# Patient Record
Sex: Female | Born: 1950
Health system: Southern US, Community
[De-identification: ages and names within clinical notes are randomized; demographics above are authoritative.]

## PROBLEM LIST (undated history)

## (undated) DIAGNOSIS — C541 Malignant neoplasm of endometrium: Secondary | ICD-10-CM

## (undated) DIAGNOSIS — K219 Gastro-esophageal reflux disease without esophagitis: Secondary | ICD-10-CM

## (undated) DIAGNOSIS — R233 Spontaneous ecchymoses: Secondary | ICD-10-CM

## (undated) DIAGNOSIS — Z923 Personal history of irradiation: Secondary | ICD-10-CM

## (undated) DIAGNOSIS — C50919 Malignant neoplasm of unspecified site of unspecified female breast: Secondary | ICD-10-CM

## (undated) DIAGNOSIS — M1990 Unspecified osteoarthritis, unspecified site: Secondary | ICD-10-CM

## (undated) DIAGNOSIS — T7840XA Allergy, unspecified, initial encounter: Secondary | ICD-10-CM

## (undated) DIAGNOSIS — J302 Other seasonal allergic rhinitis: Secondary | ICD-10-CM

## (undated) DIAGNOSIS — R238 Other skin changes: Secondary | ICD-10-CM

## (undated) DIAGNOSIS — E039 Hypothyroidism, unspecified: Secondary | ICD-10-CM

## (undated) DIAGNOSIS — Z803 Family history of malignant neoplasm of breast: Secondary | ICD-10-CM

## (undated) DIAGNOSIS — Z9221 Personal history of antineoplastic chemotherapy: Secondary | ICD-10-CM

## (undated) DIAGNOSIS — Z87440 Personal history of urinary (tract) infections: Secondary | ICD-10-CM

## (undated) DIAGNOSIS — M8580 Other specified disorders of bone density and structure, unspecified site: Secondary | ICD-10-CM

## (undated) DIAGNOSIS — D649 Anemia, unspecified: Secondary | ICD-10-CM

## (undated) DIAGNOSIS — E079 Disorder of thyroid, unspecified: Secondary | ICD-10-CM

## (undated) DIAGNOSIS — N135 Crossing vessel and stricture of ureter without hydronephrosis: Secondary | ICD-10-CM

## (undated) HISTORY — DX: Other skin changes: R23.8

## (undated) HISTORY — DX: Other specified disorders of bone density and structure, unspecified site: M85.80

## (undated) HISTORY — PX: BACK SURGERY: SHX140

## (undated) HISTORY — DX: Unspecified osteoarthritis, unspecified site: M19.90

## (undated) HISTORY — DX: Malignant neoplasm of unspecified site of unspecified female breast: C50.919

## (undated) HISTORY — DX: Family history of malignant neoplasm of breast: Z80.3

---
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
## 1999-03-29 ENCOUNTER — Encounter: Payer: Self-pay | Admitting: Neurosurgery

## 1999-03-29 ENCOUNTER — Ambulatory Visit (HOSPITAL_COMMUNITY): Admission: RE | Admit: 1999-03-29 | Discharge: 1999-03-29 | Payer: Self-pay | Admitting: Neurosurgery

## 1999-04-08 ENCOUNTER — Encounter: Payer: Self-pay | Admitting: Neurosurgery

## 1999-04-08 ENCOUNTER — Ambulatory Visit (HOSPITAL_COMMUNITY): Admission: RE | Admit: 1999-04-08 | Discharge: 1999-04-08 | Payer: Self-pay | Admitting: Neurosurgery

## 1999-11-08 ENCOUNTER — Other Ambulatory Visit: Admission: RE | Admit: 1999-11-08 | Discharge: 1999-11-08 | Payer: Self-pay | Admitting: Family Medicine

## 1999-11-10 ENCOUNTER — Encounter: Admission: RE | Admit: 1999-11-10 | Discharge: 1999-11-10 | Payer: Self-pay | Admitting: Family Medicine

## 1999-11-10 ENCOUNTER — Encounter: Payer: Self-pay | Admitting: Family Medicine

## 2000-03-27 ENCOUNTER — Encounter: Payer: Self-pay | Admitting: Surgery

## 2000-03-27 ENCOUNTER — Encounter: Admission: RE | Admit: 2000-03-27 | Discharge: 2000-03-27 | Payer: Self-pay | Admitting: Surgery

## 2001-01-17 ENCOUNTER — Other Ambulatory Visit: Admission: RE | Admit: 2001-01-17 | Discharge: 2001-01-17 | Payer: Self-pay | Admitting: Family Medicine

## 2001-01-22 ENCOUNTER — Encounter: Admission: RE | Admit: 2001-01-22 | Discharge: 2001-01-22 | Payer: Self-pay | Admitting: Family Medicine

## 2001-01-22 ENCOUNTER — Encounter: Payer: Self-pay | Admitting: Family Medicine

## 2003-09-13 ENCOUNTER — Emergency Department (HOSPITAL_COMMUNITY): Admission: EM | Admit: 2003-09-13 | Discharge: 2003-09-13 | Payer: Self-pay | Admitting: Family Medicine

## 2003-11-30 ENCOUNTER — Emergency Department (HOSPITAL_COMMUNITY): Admission: EM | Admit: 2003-11-30 | Discharge: 2003-11-30 | Payer: Self-pay | Admitting: Family Medicine

## 2003-12-04 ENCOUNTER — Emergency Department (HOSPITAL_COMMUNITY): Admission: EM | Admit: 2003-12-04 | Discharge: 2003-12-04 | Payer: Self-pay | Admitting: Family Medicine

## 2004-03-10 ENCOUNTER — Emergency Department (HOSPITAL_COMMUNITY): Admission: EM | Admit: 2004-03-10 | Discharge: 2004-03-10 | Payer: Self-pay | Admitting: Family Medicine

## 2004-03-14 ENCOUNTER — Encounter: Admission: RE | Admit: 2004-03-14 | Discharge: 2004-03-14 | Payer: Self-pay | Admitting: Family Medicine

## 2004-03-14 ENCOUNTER — Encounter (INDEPENDENT_AMBULATORY_CARE_PROVIDER_SITE_OTHER): Payer: Self-pay | Admitting: *Deleted

## 2004-03-18 ENCOUNTER — Encounter (HOSPITAL_COMMUNITY): Admission: RE | Admit: 2004-03-18 | Discharge: 2004-06-16 | Payer: Self-pay | Admitting: Surgery

## 2004-04-08 ENCOUNTER — Encounter (INDEPENDENT_AMBULATORY_CARE_PROVIDER_SITE_OTHER): Payer: Self-pay | Admitting: *Deleted

## 2004-04-08 ENCOUNTER — Ambulatory Visit (HOSPITAL_COMMUNITY): Admission: RE | Admit: 2004-04-08 | Discharge: 2004-04-08 | Payer: Self-pay | Admitting: Surgery

## 2004-04-19 ENCOUNTER — Ambulatory Visit: Payer: Self-pay | Admitting: Oncology

## 2004-04-22 ENCOUNTER — Ambulatory Visit: Payer: Self-pay

## 2004-04-25 ENCOUNTER — Encounter (HOSPITAL_COMMUNITY): Admission: RE | Admit: 2004-04-25 | Discharge: 2004-07-24 | Payer: Self-pay | Admitting: Oncology

## 2004-04-28 ENCOUNTER — Ambulatory Visit (HOSPITAL_COMMUNITY): Admission: RE | Admit: 2004-04-28 | Discharge: 2004-04-28 | Payer: Self-pay | Admitting: Oncology

## 2004-04-29 ENCOUNTER — Ambulatory Visit (HOSPITAL_BASED_OUTPATIENT_CLINIC_OR_DEPARTMENT_OTHER): Admission: RE | Admit: 2004-04-29 | Discharge: 2004-04-29 | Payer: Self-pay | Admitting: Surgery

## 2004-04-29 ENCOUNTER — Ambulatory Visit (HOSPITAL_COMMUNITY): Admission: RE | Admit: 2004-04-29 | Discharge: 2004-04-29 | Payer: Self-pay | Admitting: Surgery

## 2004-06-07 ENCOUNTER — Ambulatory Visit: Payer: Self-pay | Admitting: Oncology

## 2004-07-26 ENCOUNTER — Ambulatory Visit: Payer: Self-pay | Admitting: Oncology

## 2004-09-12 ENCOUNTER — Ambulatory Visit: Payer: Self-pay | Admitting: Oncology

## 2004-09-26 ENCOUNTER — Ambulatory Visit: Admission: RE | Admit: 2004-09-26 | Discharge: 2004-12-08 | Payer: Self-pay | Admitting: Radiation Oncology

## 2004-11-14 ENCOUNTER — Ambulatory Visit: Payer: Self-pay | Admitting: Oncology

## 2004-12-16 ENCOUNTER — Ambulatory Visit (HOSPITAL_COMMUNITY): Admission: RE | Admit: 2004-12-16 | Discharge: 2004-12-16 | Payer: Self-pay | Admitting: Surgery

## 2005-02-17 ENCOUNTER — Ambulatory Visit: Payer: Self-pay | Admitting: Oncology

## 2005-03-23 ENCOUNTER — Encounter: Admission: RE | Admit: 2005-03-23 | Discharge: 2005-03-23 | Payer: Self-pay | Admitting: Oncology

## 2005-08-02 ENCOUNTER — Ambulatory Visit: Payer: Self-pay | Admitting: Oncology

## 2005-08-16 ENCOUNTER — Encounter: Admission: RE | Admit: 2005-08-16 | Discharge: 2005-08-16 | Payer: Self-pay | Admitting: Oncology

## 2005-08-28 LAB — CBC WITH DIFFERENTIAL/PLATELET
BASO%: 0.5 % (ref 0.0–2.0)
EOS%: 4.8 % (ref 0.0–7.0)
Eosinophils Absolute: 0.2 10*3/uL (ref 0.0–0.5)
LYMPH%: 21.2 % (ref 14.0–48.0)
MCH: 33.7 pg (ref 26.0–34.0)
MCHC: 34 g/dL (ref 32.0–36.0)
MCV: 98.9 fL (ref 81.0–101.0)
MONO%: 6.8 % (ref 0.0–13.0)
Platelets: 213 10*3/uL (ref 145–400)
RBC: 3.53 10*6/uL — ABNORMAL LOW (ref 3.70–5.32)
RDW: 12.7 % (ref 11.3–14.5)

## 2005-08-28 LAB — COMPREHENSIVE METABOLIC PANEL
AST: 22 U/L (ref 0–37)
Alkaline Phosphatase: 77 U/L (ref 39–117)
Glucose, Bld: 105 mg/dL — ABNORMAL HIGH (ref 70–99)
Potassium: 3.8 mEq/L (ref 3.5–5.3)
Sodium: 139 mEq/L (ref 135–145)
Total Bilirubin: 0.6 mg/dL (ref 0.3–1.2)
Total Protein: 7.8 g/dL (ref 6.0–8.3)

## 2005-08-28 LAB — CANCER ANTIGEN 27.29: CA 27.29: 26 U/mL (ref 0–39)

## 2005-09-06 ENCOUNTER — Encounter: Admission: RE | Admit: 2005-09-06 | Discharge: 2005-09-06 | Payer: Self-pay | Admitting: Oncology

## 2006-02-23 ENCOUNTER — Ambulatory Visit: Payer: Self-pay | Admitting: Oncology

## 2006-06-20 ENCOUNTER — Ambulatory Visit: Payer: Self-pay | Admitting: Oncology

## 2006-08-16 ENCOUNTER — Ambulatory Visit: Payer: Self-pay | Admitting: Oncology

## 2006-08-20 ENCOUNTER — Encounter: Admission: RE | Admit: 2006-08-20 | Discharge: 2006-08-20 | Payer: Self-pay | Admitting: Oncology

## 2006-08-21 LAB — COMPREHENSIVE METABOLIC PANEL
AST: 17 U/L (ref 0–37)
Albumin: 4.3 g/dL (ref 3.5–5.2)
Alkaline Phosphatase: 86 U/L (ref 39–117)
Calcium: 9.2 mg/dL (ref 8.4–10.5)
Chloride: 106 mEq/L (ref 96–112)
Glucose, Bld: 96 mg/dL (ref 70–99)
Potassium: 3.7 mEq/L (ref 3.5–5.3)
Sodium: 143 mEq/L (ref 135–145)
Total Protein: 7.8 g/dL (ref 6.0–8.3)

## 2006-08-21 LAB — CBC WITH DIFFERENTIAL/PLATELET
Basophils Absolute: 0 10*3/uL (ref 0.0–0.1)
Eosinophils Absolute: 0 10*3/uL (ref 0.0–0.5)
HGB: 11.4 g/dL — ABNORMAL LOW (ref 11.6–15.9)
MCV: 95 fL (ref 81.0–101.0)
MONO%: 8.9 % (ref 0.0–13.0)
NEUT#: 2.7 10*3/uL (ref 1.5–6.5)
RBC: 3.52 10*6/uL — ABNORMAL LOW (ref 3.70–5.32)
RDW: 12.9 % (ref 11.3–14.5)
WBC: 4 10*3/uL (ref 3.9–10.0)
lymph#: 0.9 10*3/uL (ref 0.9–3.3)

## 2007-02-12 ENCOUNTER — Encounter: Admission: RE | Admit: 2007-02-12 | Discharge: 2007-02-12 | Payer: Self-pay | Admitting: Family Medicine

## 2007-08-28 ENCOUNTER — Ambulatory Visit: Payer: Self-pay | Admitting: Oncology

## 2007-09-02 LAB — CBC WITH DIFFERENTIAL/PLATELET
Basophils Absolute: 0 10*3/uL (ref 0.0–0.1)
EOS%: 1.1 % (ref 0.0–7.0)
HGB: 11.5 g/dL — ABNORMAL LOW (ref 11.6–15.9)
MCH: 33.4 pg (ref 26.0–34.0)
MCV: 96.7 fL (ref 81.0–101.0)
MONO%: 5.8 % (ref 0.0–13.0)
NEUT#: 4.1 10*3/uL (ref 1.5–6.5)
RBC: 3.44 10*6/uL — ABNORMAL LOW (ref 3.70–5.32)
RDW: 12.8 % (ref 11.3–14.5)
lymph#: 1.5 10*3/uL (ref 0.9–3.3)

## 2007-09-02 LAB — COMPREHENSIVE METABOLIC PANEL
ALT: <8 U/L (ref 0–35)
AST: 14 U/L (ref 0–37)
Albumin: 4.1 g/dL (ref 3.5–5.2)
Alkaline Phosphatase: 80 U/L (ref 39–117)
BUN: 14 mg/dL (ref 6–23)
Calcium: 9.3 mg/dL (ref 8.4–10.5)
Chloride: 103 mEq/L (ref 96–112)
Potassium: 3.6 mEq/L (ref 3.5–5.3)
Sodium: 140 mEq/L (ref 135–145)
Total Protein: 7.5 g/dL (ref 6.0–8.3)

## 2007-09-17 ENCOUNTER — Encounter: Admission: RE | Admit: 2007-09-17 | Discharge: 2007-09-17 | Payer: Self-pay | Admitting: Oncology

## 2008-03-23 ENCOUNTER — Other Ambulatory Visit: Admission: RE | Admit: 2008-03-23 | Discharge: 2008-03-23 | Payer: Self-pay | Admitting: Family Medicine

## 2008-05-27 ENCOUNTER — Encounter: Admission: RE | Admit: 2008-05-27 | Discharge: 2008-05-27 | Payer: Self-pay | Admitting: Surgery

## 2008-07-27 ENCOUNTER — Emergency Department (HOSPITAL_COMMUNITY): Admission: EM | Admit: 2008-07-27 | Discharge: 2008-07-27 | Payer: Self-pay | Admitting: Emergency Medicine

## 2008-09-09 ENCOUNTER — Ambulatory Visit: Payer: Self-pay | Admitting: Oncology

## 2008-09-11 LAB — CBC WITH DIFFERENTIAL/PLATELET
Basophils Absolute: 0 10*3/uL (ref 0.0–0.1)
Eosinophils Absolute: 0.1 10*3/uL (ref 0.0–0.5)
HCT: 33.3 % — ABNORMAL LOW (ref 34.8–46.6)
HGB: 11.2 g/dL — ABNORMAL LOW (ref 11.6–15.9)
NEUT#: 3 10*3/uL (ref 1.5–6.5)
NEUT%: 68.8 % (ref 38.4–76.8)
RDW: 12.7 % (ref 11.2–14.5)
lymph#: 1 10*3/uL (ref 0.9–3.3)

## 2008-09-11 LAB — COMPREHENSIVE METABOLIC PANEL
Albumin: 3.7 g/dL (ref 3.5–5.2)
BUN: 12 mg/dL (ref 6–23)
CO2: 27 mEq/L (ref 19–32)
Calcium: 8.4 mg/dL (ref 8.4–10.5)
Chloride: 107 mEq/L (ref 96–112)
Creatinine, Ser: 0.73 mg/dL (ref 0.40–1.20)
Glucose, Bld: 98 mg/dL (ref 70–99)
Potassium: 4 mEq/L (ref 3.5–5.3)

## 2008-09-11 LAB — CANCER ANTIGEN 27.29: CA 27.29: 23 U/mL (ref 0–39)

## 2009-09-06 ENCOUNTER — Ambulatory Visit: Payer: Self-pay | Admitting: Oncology

## 2009-09-08 LAB — CBC WITH DIFFERENTIAL/PLATELET
BASO%: 0.4 % (ref 0.0–2.0)
Basophils Absolute: 0 10*3/uL (ref 0.0–0.1)
EOS%: 2 % (ref 0.0–7.0)
HGB: 11 g/dL — ABNORMAL LOW (ref 11.6–15.9)
MCH: 34.5 pg — ABNORMAL HIGH (ref 25.1–34.0)
MCHC: 34.1 g/dL (ref 31.5–36.0)
MCV: 101.1 fL — ABNORMAL HIGH (ref 79.5–101.0)
MONO%: 8.8 % (ref 0.0–14.0)
RBC: 3.2 10*6/uL — ABNORMAL LOW (ref 3.70–5.45)
RDW: 12.6 % (ref 11.2–14.5)
lymph#: 1.5 10*3/uL (ref 0.9–3.3)

## 2009-09-08 LAB — COMPREHENSIVE METABOLIC PANEL
ALT: 12 U/L (ref 0–35)
AST: 21 U/L (ref 0–37)
Albumin: 3.6 g/dL (ref 3.5–5.2)
Alkaline Phosphatase: 51 U/L (ref 39–117)
BUN: 11 mg/dL (ref 6–23)
Potassium: 3.7 mEq/L (ref 3.5–5.3)

## 2009-09-29 ENCOUNTER — Encounter: Admission: RE | Admit: 2009-09-29 | Discharge: 2009-09-29 | Payer: Self-pay | Admitting: Oncology

## 2010-06-11 ENCOUNTER — Encounter: Payer: Self-pay | Admitting: Surgery

## 2010-06-12 ENCOUNTER — Encounter: Payer: Self-pay | Admitting: Oncology

## 2010-06-12 ENCOUNTER — Encounter: Payer: Self-pay | Admitting: Surgery

## 2010-09-06 ENCOUNTER — Encounter (HOSPITAL_BASED_OUTPATIENT_CLINIC_OR_DEPARTMENT_OTHER): Payer: 59 | Admitting: Oncology

## 2010-09-06 ENCOUNTER — Other Ambulatory Visit: Payer: Self-pay | Admitting: Oncology

## 2010-09-06 DIAGNOSIS — Z170 Estrogen receptor positive status [ER+]: Secondary | ICD-10-CM

## 2010-09-06 DIAGNOSIS — C50519 Malignant neoplasm of lower-outer quadrant of unspecified female breast: Secondary | ICD-10-CM

## 2010-09-06 LAB — COMPREHENSIVE METABOLIC PANEL
Alkaline Phosphatase: 51 U/L (ref 39–117)
BUN: 12 mg/dL (ref 6–23)
Glucose, Bld: 87 mg/dL (ref 70–99)
Total Bilirubin: 0.6 mg/dL (ref 0.3–1.2)

## 2010-09-06 LAB — CBC WITH DIFFERENTIAL/PLATELET
Basophils Absolute: 0 10*3/uL (ref 0.0–0.1)
EOS%: 1 % (ref 0.0–7.0)
Eosinophils Absolute: 0.1 10*3/uL (ref 0.0–0.5)
HGB: 10.8 g/dL — ABNORMAL LOW (ref 11.6–15.9)
LYMPH%: 24 % (ref 14.0–49.7)
MCH: 33.7 pg (ref 25.1–34.0)
MCV: 100.7 fL (ref 79.5–101.0)
MONO%: 6.8 % (ref 0.0–14.0)
NEUT#: 4 10*3/uL (ref 1.5–6.5)
Platelets: 193 10*3/uL (ref 145–400)
RBC: 3.2 10*6/uL — ABNORMAL LOW (ref 3.70–5.45)

## 2010-09-07 LAB — VITAMIN D 25 HYDROXY (VIT D DEFICIENCY, FRACTURES): Vit D, 25-Hydroxy: 16 ng/mL — ABNORMAL LOW (ref 30–89)

## 2010-09-13 ENCOUNTER — Encounter (HOSPITAL_BASED_OUTPATIENT_CLINIC_OR_DEPARTMENT_OTHER): Payer: 59 | Admitting: Oncology

## 2010-09-13 DIAGNOSIS — M129 Arthropathy, unspecified: Secondary | ICD-10-CM

## 2010-09-13 DIAGNOSIS — Z170 Estrogen receptor positive status [ER+]: Secondary | ICD-10-CM

## 2010-09-13 DIAGNOSIS — C50519 Malignant neoplasm of lower-outer quadrant of unspecified female breast: Secondary | ICD-10-CM

## 2010-10-05 ENCOUNTER — Other Ambulatory Visit: Payer: Self-pay | Admitting: Family Medicine

## 2010-10-05 DIAGNOSIS — Z9889 Other specified postprocedural states: Secondary | ICD-10-CM

## 2010-10-07 NOTE — Op Note (Signed)
[name], [name]                 ACCOUNT NO.:  [id number]   MEDICAL RECORD NO.:  [id number]          PATIENT TYPE:  OIB   LOCATION:  2899                         FACILITY:  [facility]   PHYSICIAN:  Thornton Park. Daphine Deutscher, MD  DATE OF BIRTH:  [birth year]   DATE OF PROCEDURE:  04/08/2004  DATE OF DISCHARGE:  04/08/2004                                 OPERATIVE REPORT   PREOPERATIVE DIAGNOSIS:  Right breast carcinoma.   POSTOPERATIVE DIAGNOSIS:  Right breast carcinoma.   PROCEDURE:  Right sentinel lymph node biopsy (two nodes, touch prep  negative), right breast quadrantectomy for a 2.5 cm mass, biopsy-proven  cancer.   SURGEON:  Thornton Park. Daphine Deutscher, MD   ANESTHESIA:  General endotracheal.   DRAINS:  None.   ESTIMATED BLOOD LOSS:  Minimal.   DESCRIPTION OF PROCEDURE:  Erica [name] is a 60 year old who was taken to  room 1 on November 18, given general anesthesia.  The chest wall was prepped  with Betadine and draped sterilely.  She had previously been injected with  technetium sulfur colloid, and I subsequently injected the nipple-  periareolar region with Lymphazurin blue.  I mapped the axilla, and there  was a hot spot noted and marked.  After prepping, I used that as a mark to  make a transverse incision in the axilla along the skin line and then worked  my way through the axillary fascia beneath the pectoral muscle and came down  onto blue, hot nodes side by side.  These were removed using a combination  of clips and clamps and 4-0 Vicryl ties.  Both were hot, with the second one  being the hottest and the largest.  I went back and checked.  Background  radiation numbers were good noted.  The area was irrigated and packed.  In  the meantime, I then went up to the breast, where I made a long curvilinear  incision in the inferior aspect of the right breast.  I created flaps  superficially both superiorly and inferiorly and medially and laterally.  I  then went down to the chest wall and  removed this mass using the Bovie off  the chest wall.  At no time did I feel that I transected the mass.  I then  irrigated it with saline.  I inspected the cavity and used the  electrocautery.  The specimen was marked with metallic markers both for deep  surface, anterior surface, medial-lateral in diameter and superior and  inferior.  It was sent for touch preps and permanent sections.   The wounds were closed with 4-0 Vicryl subcutaneously and subcuticularly.  The axilla was closed with staples and the breast was closed with Benzoin  and Steri-Strips.  The patient seemed to tolerate this procedure well.  She  was taken to the recovery room in satisfactory condition.  She will be given  Tylox for pain and will be sent home tonight.      Matt   MBM/MEDQ  D:  04/08/2004  T:  04/09/2004  Job:  [id number]   cc:   Consuella Lose  Emelia Salisbury, M.D.  [address] [place]  [place] [address]  Fax: [phone]

## 2010-10-07 NOTE — Op Note (Signed)
[name], Erica                 ACCOUNT NO.:  [id number]   MEDICAL RECORD NO.:  [id number]          PATIENT TYPE:  AMB   LOCATION:  DAY                          FACILITY:  [facility]   PHYSICIAN:  Thornton Park. Daphine Deutscher, MD  DATE OF BIRTH:  02/16/1951   DATE OF PROCEDURE:  DATE OF DISCHARGE:                                 OPERATIVE REPORT   PREOPERATIVE DIAGNOSIS:  Breast cancer with the port in place. Removal of  plastic Port-A-Cath.   POSTOPERATIVE DIAGNOSIS:  Not given.   SURGEON:  Thornton Park. Daphine Deutscher, MD   ANESTHESIA:  MAC.   DESCRIPTION OF PROCEDURE:  Erica [name] was taken back to room one, given  some sedation. Her left chest was prepped with Betadine and draped  sterilely. I used 1% lidocaine to infiltrate a small area where I could get  to the port. I cut down on the port and removed the catheter first and then  the port itself in its entirety. The wound was closed with 4-0 Vicryl,  Benzoin and Steri-Strips. The patient tolerated the procedure well and Erica  be given some Tylox to take if needed for pain.       MBM/MEDQ  D:  12/16/2004  T:  12/16/2004  Job:  [id number]

## 2010-10-07 NOTE — Op Note (Signed)
[name], [name]                 ACCOUNT NO.:  [id number]   MEDICAL RECORD NO.:  [id number]          PATIENT TYPE:  AMB   LOCATION:  DSC                          FACILITY:  [facility]   PHYSICIAN:  Thornton Park. Daphine Deutscher, MD  DATE OF BIRTH:  [birth year]   DATE OF PROCEDURE:  04/29/2004  DATE OF DISCHARGE:                                 OPERATIVE REPORT   PROCEDURE:  Left subclavian Port-A-Cath.   SURGEON:  Thornton Park. Daphine Deutscher, MD   ANESTHESIA:  MAC with propofol.   DESCRIPTION OF PROCEDURE:  Erica [name] was taken to room #8 and placed in  the Trendelenburg position.  Her chest was prepped widely with Betadine and  draped sterilely.  Area was infiltrated underneath her left clavicle with 1%  lidocaine, and first two passes did not generate any fluid.  On the third  pass, I got into vein with good return and the wire wen into the SVC right  atrium easily.  This was verified with a C arm.  I then went ahead and  created a pouch beneath that using lidocaine and transverse incision, and  then tunneled a port to the wire site.  The port was ready for placement,  and the sutures of Prolene were placed in the fascia and into the port which  was waiting for the already flushed line which was ready to go in over the  obturator and wire.  However, I had some difficulty with the 8 [place]  dilator going in over the wire and ended up getting an 11 Cook to dilate the  tract and then was able to pass the #8 wire over that.  Then, under  fluoroscopic vision, I passed the catheter into the right atrium and then  pulled it back to the cavoatrial junction.  It flushed easily and drew back  easily.  Peel-away sheath came out in entirety as this maneuver was  accomplished.  The catheter was then cut and placed over on the stem and  then locked in place with the locking device.  The black part was noted on  the distal end of the connection closest to the patient's vessel.  It was  then tied in place in the pouch  and then the pouch was closed with 4-0  Vicryl as was the other side.  Prior to closure, I did flush this with  concentrated aqueous heparin after verifying that it would seal easily.  The  patient was awakened and taken to the recovery room where a chest x-ray was  to be performed.      Matt   MBM/MEDQ  D:  04/29/2004  T:  04/30/2004  Job:  [id number]

## 2010-10-13 ENCOUNTER — Ambulatory Visit
Admission: RE | Admit: 2010-10-13 | Discharge: 2010-10-13 | Disposition: A | Discharge status: Home / Self Care | Payer: 59 | Source: Ambulatory Visit | Attending: Family Medicine | Admitting: Family Medicine

## 2010-10-13 DIAGNOSIS — Z9889 Other specified postprocedural states: Secondary | ICD-10-CM

## 2011-04-18 ENCOUNTER — Other Ambulatory Visit (HOSPITAL_COMMUNITY)
Admission: RE | Admit: 2011-04-18 | Discharge: 2011-04-18 | Disposition: A | Discharge status: Home / Self Care | Payer: 59 | Source: Ambulatory Visit | Attending: Family Medicine | Admitting: Family Medicine

## 2011-04-18 ENCOUNTER — Other Ambulatory Visit: Payer: Self-pay | Admitting: Adult Health

## 2011-04-18 DIAGNOSIS — Z01419 Encounter for gynecological examination (general) (routine) without abnormal findings: Secondary | ICD-10-CM | POA: Insufficient documentation

## 2011-09-19 ENCOUNTER — Other Ambulatory Visit: Payer: Self-pay | Admitting: *Deleted

## 2011-09-19 DIAGNOSIS — C50519 Malignant neoplasm of lower-outer quadrant of unspecified female breast: Secondary | ICD-10-CM

## 2011-09-19 MED ORDER — TAMOXIFEN CITRATE 20 MG PO TABS: 20.0000 mg | ORAL_TABLET | Freq: Every day | ORAL | Status: AC

## 2011-09-20 ENCOUNTER — Other Ambulatory Visit: Payer: Self-pay | Admitting: *Deleted

## 2011-09-21 ENCOUNTER — Telehealth: Payer: Self-pay | Admitting: Oncology

## 2011-09-21 NOTE — Telephone Encounter (Signed)
S/w the pt's husband and he is aware of the June 2013 appts

## 2011-09-26 ENCOUNTER — Other Ambulatory Visit: Payer: Self-pay | Admitting: Oncology

## 2011-09-26 DIAGNOSIS — Z853 Personal history of malignant neoplasm of breast: Secondary | ICD-10-CM

## 2011-10-17 ENCOUNTER — Other Ambulatory Visit: Payer: Self-pay | Admitting: Oncology

## 2011-10-17 ENCOUNTER — Ambulatory Visit
Admission: RE | Admit: 2011-10-17 | Discharge: 2011-10-17 | Disposition: A | Discharge status: Home / Self Care | Payer: 59 | Source: Ambulatory Visit | Attending: Oncology | Admitting: Oncology

## 2011-10-17 DIAGNOSIS — Z853 Personal history of malignant neoplasm of breast: Secondary | ICD-10-CM

## 2011-11-07 ENCOUNTER — Other Ambulatory Visit (HOSPITAL_BASED_OUTPATIENT_CLINIC_OR_DEPARTMENT_OTHER): Payer: 59 | Admitting: Lab

## 2011-11-07 ENCOUNTER — Telehealth: Payer: Self-pay | Admitting: *Deleted

## 2011-11-07 DIAGNOSIS — C50519 Malignant neoplasm of lower-outer quadrant of unspecified female breast: Secondary | ICD-10-CM

## 2011-11-07 DIAGNOSIS — Z170 Estrogen receptor positive status [ER+]: Secondary | ICD-10-CM

## 2011-11-07 LAB — COMPREHENSIVE METABOLIC PANEL
AST: 18 U/L (ref 0–37)
Albumin: 3.5 g/dL (ref 3.5–5.2)
Alkaline Phosphatase: 52 U/L (ref 39–117)
Potassium: 3.7 mEq/L (ref 3.5–5.3)
Sodium: 138 mEq/L (ref 135–145)
Total Bilirubin: 0.4 mg/dL (ref 0.3–1.2)
Total Protein: 7.3 g/dL (ref 6.0–8.3)

## 2011-11-07 LAB — CBC WITH DIFFERENTIAL/PLATELET
EOS%: 1.1 % (ref 0.0–7.0)
LYMPH%: 27.2 % (ref 14.0–49.7)
MCH: 32.9 pg (ref 25.1–34.0)
MCHC: 32.8 g/dL (ref 31.5–36.0)
MCV: 100.4 fL (ref 79.5–101.0)
MONO%: 9.3 % (ref 0.0–14.0)
RBC: 3.22 10*6/uL — ABNORMAL LOW (ref 3.70–5.45)
RDW: 12.8 % (ref 11.2–14.5)

## 2011-11-07 LAB — VITAMIN B12: Vitamin B-12: 406 pg/mL (ref 211–911)

## 2011-11-07 NOTE — Telephone Encounter (Signed)
per patient's request from 11-07-2011 in person

## 2011-11-14 ENCOUNTER — Ambulatory Visit: Payer: 59 | Admitting: Oncology

## 2011-11-16 ENCOUNTER — Telehealth: Payer: Self-pay | Admitting: Oncology

## 2011-11-16 ENCOUNTER — Ambulatory Visit (HOSPITAL_BASED_OUTPATIENT_CLINIC_OR_DEPARTMENT_OTHER): Payer: 59 | Admitting: Oncology

## 2011-11-16 VITALS — BP 138/64 | HR 76 | Temp 98.3°F | Ht 64.5 in | Wt 166.5 lb

## 2011-11-16 DIAGNOSIS — D649 Anemia, unspecified: Secondary | ICD-10-CM

## 2011-11-16 DIAGNOSIS — C50519 Malignant neoplasm of lower-outer quadrant of unspecified female breast: Secondary | ICD-10-CM

## 2011-11-16 DIAGNOSIS — C50919 Malignant neoplasm of unspecified site of unspecified female breast: Secondary | ICD-10-CM

## 2011-11-16 MED ORDER — TAMOXIFEN CITRATE 20 MG PO TABS: 20.0000 mg | ORAL_TABLET | Freq: Every day | ORAL | Status: DC

## 2011-11-16 MED ORDER — GABAPENTIN 300 MG PO CAPS: 300.0000 mg | ORAL_CAPSULE | Freq: Every day | ORAL | Status: DC

## 2011-11-16 NOTE — Progress Notes (Signed)
ID: Erica [name]   DOB: 01-Nov-1950  MR#: [id number]  FAO#:[id number]  HISTORY OF PRESENT ILLNESS: The patient herself noted a lump in her right breast in October 2005.  She brought it to her physician's attention and was set up for mammograms 03/14/04.  These showed a suspicious mass in the right breast.  It was palpable by ultrasonography.  It was well circumscribed, and measured up to 2.8 cm.  This mass was biopsied on 03/14/04 and showed ([id number]) a high-grade invasive mammary carcinoma.  The prognostic profile showed the tumor to be positive for the estrogen receptor at 6%, negative for the progesterone receptor, and 1+ on the Herceptest.    With this information, the patient was referred to Dr. Daphine Deutscher and after appropriate discussion, he proceeded to right lumpectomy and sentinel lymph node biopsy 04/08/04.  The final pathology report ([id number]) showed a 3.2 cm grade 3 infiltrating ductal carcinoma with negative margins but evidence of vascular/lymphatic invasion, with 0 of 2 sentinel lymph nodes involved.  Her subsequent history is as detailed below.  INTERVAL HISTORY: Erica [name] returns today with her husband Christiane Ha for followup of her breast cancer. The interval history is generally unremarkable. She continues to work full-time.  REVIEW OF SYSTEMS: She has some cramps in her legs at times. She complains of a dry mouth and some difficulty swallowing sometimes. She has pain in her hands and left arm at times. This is associated with her work she says. She feels tired overall. She has a runny nose, occasional ankle swelling, and some varicosities that can be painful when she walks. She has her back and joint pain which is unchanged from prior. She has rare headaches which she controls with Tylenol or Aleve. Hot flashes are moderate. She benefits from gabapentin at bedtime. Otherwise a detailed review of systems was noncontributory  PAST MEDICAL HISTORY: Significant for osteoarthritis, status post  laminectomy remotely, and hypothyroidism, followed by Dr. Valentina Lucks.  FAMILY HISTORY The patient never knew her father, and was not acquainted with his family.  Her mother is living, and she has three brothers alive, one has died.  She has four sisters alive.    GYNECOLOGIC HISTORY: She is G5, P5, but two children died shortly after birth.  She has three surviving children, as described below.  Last menstrual period was about 1997.  She never took hormones.  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  Her husband, Christiane Ha, used to work at [employer].  He is now retired.  He has a history of cancer--I am not sure what type.  He was treated with chemotherapy, he says, and radiation under Dr. Dayton Scrape.  Their children are Verlin Grills who works for [employer], Ethelene Browns who lives in [place] and works in a shipping yard, and [place], their daughter who lives at home with Tammy's son.  The patient is a Control and instrumentation engineer.   ADVANCED DIRECTIVES: Not in place  HEALTH MAINTENANCE: History  Substance Use Topics  . Smoking status: Not on file  . Smokeless tobacco: Not on file  . Alcohol Use: Not on file     Colonoscopy:  PAP:  Bone density:  Lipid panel:  Allergies  Allergen Reactions  . Gadolinium      Desc: Pt developed nausea and vomiting after receiving 17cc Multihance. Came back for repeat study and did fine with Magnevist. Erica [name], Erica [name]     Current Outpatient Prescriptions  Medication Sig Dispense Refill  .  Calcium Carbonate-Vitamin D (CALCIUM + D PO) Take 1 tablet by mouth 2 (two) times daily.      [name] etodolac (LODINE) 400 MG tablet       . gabapentin (NEURONTIN) 300 MG capsule Take 300 mg by mouth at bedtime.      [name] SYNTHROID 75 MCG tablet       . tamoxifen (NOLVADEX) 20 MG tablet         OBJECTIVE: Middle-aged African American woman who appears fatigued Filed Vitals:   11/16/11 1435  BP: 138/64  Pulse: 76  Temp:  98.3 F (36.8 C)     Body mass index is 28.14 kg/(m^2).    ECOG FS: 0  Sclerae unicteric Oropharynx clear, slightly dry. Mild thyromegaly. There is no stridor on auscultation of the neck No cervical or supraclavicular adenopathy Lungs no rales or rhonchi Heart regular rate and rhythm Abd benign MSK no focal spinal tenderness, no peripheral edema Neuro: nonfocal Breasts: Status post right lumpectomy and radiation; no evidence of local recurrence. Left breast is unremarkable.  LAB RESULTS: Lab Results  Component Value Date   WBC 4.9 11/07/2011   NEUTROABS 3.0 11/07/2011   HGB 10.6* 11/07/2011   HCT 32.4* 11/07/2011   MCV 100.4 11/07/2011   PLT 180 11/07/2011      Chemistry      Component Value Date/Time   NA 138 11/07/2011 1457   K 3.7 11/07/2011 1457   CL 103 11/07/2011 1457   CO2 28 11/07/2011 1457   BUN 11 11/07/2011 1457   CREATININE 0.66 11/07/2011 1457      Component Value Date/Time   CALCIUM 8.9 11/07/2011 1457   ALKPHOS 52 11/07/2011 1457   AST 18 11/07/2011 1457   ALT 7 11/07/2011 1457   BILITOT 0.4 11/07/2011 1457       Lab Results  Component Value Date   LABCA2 22 11/07/2011    No components found with this basename: [id number]    No results found for this basename: INR:1;PROTIME:1 in the last 168 hours  Urinalysis No results found for this basename: colorurine, appearanceur, labspec, phurine, glucoseu, hgbur, bilirubinur, ketonesur, proteinur, urobilinogen, nitrite, leukocytesur    STUDIES: Mm Digital Screening  10/18/2011  *RADIOLOGY REPORT*  Clinical Data: Screening. Right lumpectomy for breast cancer in 2005.  MAMMOGRAPHIC BILATERAL DIGITAL SCREENING WITH CAD  Findings: The breast tissue is heterogeneously dense.  No masses or malignant type calcifications are identified.  Compared with prior studies. Right lumpectomy changes are again noted.  Images were processed with CAD.  IMPRESSION: No specific mammographic evidence of malignancy.  A result letter of this  screening mammogram will be mailed directly to the patient.  RECOMMENDATION: Screening mammogram in one year. (Code:SM-B-01Y)  BI-RADS CATEGORY 2:  Benign finding(s).  Original Report Authenticated By: Harrel Lemon, M.D.    ASSESSMENT: 61 y.o. [place] woman status post right lumpectomy and sentinel lymph node biopsy in November of 2005 for a T2 N0 grade 3 invasive ductal carcinoma which was weakly estrogen receptor positive, progesterone receptor negative, and HER2 negative, treated with cyclophosphamide and doxorubicin times 4, then weekly paclitaxel times 9, then radiation, completed in July of 2006.  She took anastrozole between July of 2006 and April of 2009, at which time she was switched to tamoxifen.   PLAN: The new data suggests that continuing tamoxifen to a total of 10 years further reduces the risk of breast cancer recurrence as compared to 5 years. We're going to therefore continue Erica [name]'s tamoxifen an additional  2 years. She tells me she just had a bone density through the [facility] but I do not have those results. In any case tamoxifen should be helpful as far as that is concerned.   She is moderately anemic and has been in the past, with hemoglobins as low as 10.3 in November of 2006. Her hemoglobin a year ago was 10.8. Today we obtained a ferritin which is 56, a folate which was 18.1, and a B12 level which was 406, all in the normal range. She has normal creatinine and liver function tests, and there is no evidence of disease recurrence to date. I expect we're dealing with "anemia of chronic illness"  or a myelodysplasia. Given her overall stability, I do not believe proceeding to bone marrow biopsy at this point would be warranted.     Li Fragoso C    11/16/2011

## 2011-11-16 NOTE — Telephone Encounter (Signed)
lmonvm adviisng the pt of her June 2014 appts °

## 2011-12-19 ENCOUNTER — Other Ambulatory Visit: Payer: Self-pay | Admitting: *Deleted

## 2011-12-19 DIAGNOSIS — C50919 Malignant neoplasm of unspecified site of unspecified female breast: Secondary | ICD-10-CM

## 2011-12-19 MED ORDER — TAMOXIFEN CITRATE 20 MG PO TABS: 20.0000 mg | ORAL_TABLET | Freq: Every day | ORAL | Status: DC

## 2012-11-06 ENCOUNTER — Other Ambulatory Visit: Payer: 59 | Admitting: Lab

## 2012-11-06 ENCOUNTER — Ambulatory Visit: Payer: 59 | Admitting: Oncology

## 2012-11-14 ENCOUNTER — Ambulatory Visit: Payer: 59 | Admitting: Oncology

## 2012-11-15 ENCOUNTER — Telehealth: Payer: Self-pay | Admitting: Oncology

## 2012-11-15 ENCOUNTER — Encounter: Payer: Self-pay | Admitting: Oncology

## 2012-11-15 NOTE — Telephone Encounter (Signed)
Letter sent to patient from Dr. Magrinat. °

## 2012-11-26 ENCOUNTER — Other Ambulatory Visit: Payer: Self-pay

## 2012-11-26 ENCOUNTER — Telehealth: Payer: Self-pay | Admitting: *Deleted

## 2012-11-26 DIAGNOSIS — Z1231 Encounter for screening mammogram for malignant neoplasm of breast: Secondary | ICD-10-CM

## 2012-11-26 NOTE — Telephone Encounter (Signed)
Pt called for an appt w/ [facility]. gv appt d/t for 01/21/13 @ 4pm. Pt is aware...td

## 2012-12-16 ENCOUNTER — Other Ambulatory Visit: Payer: Self-pay | Admitting: *Deleted

## 2012-12-16 DIAGNOSIS — C50911 Malignant neoplasm of unspecified site of right female breast: Secondary | ICD-10-CM

## 2012-12-16 MED ORDER — TAMOXIFEN CITRATE 20 MG PO TABS: 20.0000 mg | ORAL_TABLET | Freq: Every day | ORAL | Status: DC

## 2012-12-18 ENCOUNTER — Ambulatory Visit: Payer: 59

## 2012-12-25 ENCOUNTER — Ambulatory Visit
Admission: RE | Admit: 2012-12-25 | Discharge: 2012-12-25 | Disposition: A | Discharge status: Home / Self Care | Payer: 59 | Source: Ambulatory Visit

## 2012-12-25 DIAGNOSIS — Z1231 Encounter for screening mammogram for malignant neoplasm of breast: Secondary | ICD-10-CM

## 2013-01-21 ENCOUNTER — Ambulatory Visit (HOSPITAL_BASED_OUTPATIENT_CLINIC_OR_DEPARTMENT_OTHER): Payer: 59 | Admitting: Oncology

## 2013-01-21 ENCOUNTER — Telehealth: Payer: Self-pay | Admitting: Oncology

## 2013-01-21 VITALS — BP 137/74 | HR 87 | Temp 98.6°F | Resp 20 | Ht 64.5 in | Wt 154.1 lb

## 2013-01-21 DIAGNOSIS — R634 Abnormal weight loss: Secondary | ICD-10-CM

## 2013-01-21 DIAGNOSIS — C50519 Malignant neoplasm of lower-outer quadrant of unspecified female breast: Secondary | ICD-10-CM

## 2013-01-21 DIAGNOSIS — C50919 Malignant neoplasm of unspecified site of unspecified female breast: Secondary | ICD-10-CM

## 2013-01-21 DIAGNOSIS — Z170 Estrogen receptor positive status [ER+]: Secondary | ICD-10-CM

## 2013-01-21 NOTE — Progress Notes (Signed)
Patient ID: Erica [name], female   DOB: [birth year], 62 y.o.   MRN: [id number] ID: Erica [name]   DOB: [birth year]  MR#: [id number]  YQI#:[id number]  HISTORY OF PRESENT ILLNESS: The patient herself noted a lump in her right breast in October 2005.  She brought it to her physician's attention and was set up for mammograms 03/14/04.  These showed a suspicious mass in the right breast.  It was palpable by ultrasonography.  It was well circumscribed, and measured up to 2.8 cm.  This mass was biopsied on 03/14/04 and showed ([id number]) a high-grade invasive mammary carcinoma.  The prognostic profile showed the tumor to be positive for the estrogen receptor at 6%, negative for the progesterone receptor, and 1+ on the Herceptest.    With this information, the patient was referred to Dr. Daphine Deutscher and after appropriate discussion, he proceeded to right lumpectomy and sentinel lymph node biopsy 04/08/04.  The final pathology report ([id number]) showed a 3.2 cm grade 3 infiltrating ductal carcinoma with negative margins but evidence of vascular/lymphatic invasion, with 0 of 2 sentinel lymph nodes involved.  Her subsequent history is as detailed below.  INTERVAL HISTORY: Erica [name] returns today for followup of her breast cancer. T She continues to work full-time.her husband Christiane Ha is having some back problems but they are not able to do surgery now she tells me.  REVIEW OF SYSTEMS: She is tolerating the tamoxifen well. She has a little bit of dry mouth and a little bit of numbness in her feet, which neither of which is likely to be related to that medication. A detailed review of systems today was entirely negative.  PAST MEDICAL HISTORY: Significant for osteoarthritis, status post laminectomy remotely, and hypothyroidism, followed by Dr. Valentina Lucks.  FAMILY HISTORY The patient never knew her father, and was not acquainted with his family.  Her mother is living, and she has three brothers alive, one has died.  She has four  sisters alive.    GYNECOLOGIC HISTORY: She is G5, P5, but two children died shortly after birth.  She has three surviving children, as described below.  Last menstrual period was about 1997.  She never took hormones.  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  Her husband, Christiane Ha, used to work at [employer].  He is now retired.  He has a history of cancer--I am not sure what type.  He was treated with chemotherapy, he says, and radiation under Dr. Dayton Scrape.  Their children are Verlin Grills who works for [employer], Ethelene Browns who lives in [place] and works in a shipping yard, and [place], their daughter who lives at home with Tammy's son.  The patient is a Control and instrumentation engineer.   ADVANCED DIRECTIVES: Not in place  HEALTH MAINTENANCE: History  Substance Use Topics  . Smoking status: Not on file  . Smokeless tobacco: Not on file  . Alcohol Use: Not on file     Colonoscopy:  PAP:  Bone density:  Lipid panel:  Allergies  Allergen Reactions  . Gadolinium      Desc: Pt developed nausea and vomiting after receiving 17cc Multihance. Came back for repeat study and did fine with Magnevist. Luberta Mutter, Onset Date: [id number]     Current Outpatient Prescriptions  Medication Sig Dispense Refill  . Calcium Carbonate-Vitamin D (CALCIUM + D PO) Take 1 tablet by mouth 2 (two) times daily.      [name] etodolac (LODINE) 400 MG tablet       .  gabapentin (NEURONTIN) 300 MG capsule Take 1 capsule (300 mg total) by mouth at bedtime.  90 capsule  12  . SYNTHROID 75 MCG tablet       . tamoxifen (NOLVADEX) 20 MG tablet Take 1 tablet (20 mg total) by mouth daily.  30 tablet  1   No current facility-administered medications for this visit.    OBJECTIVE: Middle-aged [place] American woman In no acute distress  Filed Vitals:   01/21/13 1551  BP: 137/74  Pulse: 87  Temp: 98.6 F (37 C)  Resp: 20     Body mass index is 26.05 kg/(m^2).    ECOG FS: 0  Sclerae  unicteric, pupils equal round and reactive to light Oropharynx clear No cervical or supraclavicular adenopathy Lungs no rales or rhonchi, good excursion bilaterally Heart regular rate and rhythm, no murmur appreciated Abdsoft, nontender, positive bowel sounds MSK no focal spinal tenderness, no peripheral edema Neuro: nonfocal, well oriented, tired affect Breasts: Status post right lumpectomy and radiation; no evidence of local recurrence. The right axilla is benign. Left breast is unremarkable.  LAB RESULTS: Lab Results  Component Value Date   WBC 4.9 11/07/2011   NEUTROABS 3.0 11/07/2011   HGB 10.6* 11/07/2011   HCT 32.4* 11/07/2011   MCV 100.4 11/07/2011   PLT 180 11/07/2011      Chemistry      Component Value Date/Time   NA 138 11/07/2011 1457   K 3.7 11/07/2011 1457   CL 103 11/07/2011 1457   CO2 28 11/07/2011 1457   BUN 11 11/07/2011 1457   CREATININE 0.66 11/07/2011 1457      Component Value Date/Time   CALCIUM 8.9 11/07/2011 1457   ALKPHOS 52 11/07/2011 1457   AST 18 11/07/2011 1457   ALT 7 11/07/2011 1457   BILITOT 0.4 11/07/2011 1457       Lab Results  Component Value Date   LABCA2 22 11/07/2011    No components found with this basename: [id number]    No results found for this basename: INR,  in the last 168 hours  Urinalysis No results found for this basename: colorurine,  appearanceur,  labspec,  phurine,  glucoseu,  hgbur,  bilirubinur,  ketonesur,  proteinur,  urobilinogen,  nitrite,  leukocytesur    STUDIES: Mm Digital Screening  12/26/2012   *RADIOLOGY REPORT*  Clinical Data: Screening.  DIGITAL SCREENING BILATERAL MAMMOGRAM WITH CAD  Comparison:  Previous exam(s).  FINDINGS:  ACR Breast Density Category c:  The breast tissue is heterogeneously dense, which may obscure small masses.  There are no findings suspicious for malignancy. There is density and architectural distortion in the right breast, stable when compared the prior exam and consistent with a  postsurgical scar.  Images were processed with CAD.  IMPRESSION: No mammographic evidence of malignancy.  A result letter of this screening mammogram will be mailed directly to the patient.  RECOMMENDATION: Screening mammogram in one year. (Code:SM-B-01Y)  BI-RADS CATEGORY 1:  Negative.   Original Report Authenticated By: Edwin Cap, M.D.     ASSESSMENT: 62 y.o. [place] woman status post right lumpectomy and sentinel lymph node biopsy in November of 2005 for a T2 N0 grade 3 invasive ductal carcinoma which was weakly estrogen receptor positive, progesterone receptor negative, and HER2 negative, treated with cyclophosphamide and doxorubicin times 4, then weekly paclitaxel times 9, then radiation, completed in July of 2006.  She took anastrozole between July of 2006 and April of 2009, at which time she was switched to tamoxifen.  PLAN: Ashira appears to be doing quite well from a breast cancer point of view. Certainly the review of systems in exam today her noncontributory. The only concern I have is that she's lost 12 pounds over the last year and she really cannot account for this by change in diet or activity. I am going to get some lab work tomorrow just in case. Otherwise the plan is to continue tamoxifen until 2016. She knows to call for any problems that may develop before next visit here.   Torah Pinnock C    01/21/2013

## 2013-01-22 ENCOUNTER — Other Ambulatory Visit (HOSPITAL_BASED_OUTPATIENT_CLINIC_OR_DEPARTMENT_OTHER): Payer: 59 | Admitting: Lab

## 2013-01-22 DIAGNOSIS — C50519 Malignant neoplasm of lower-outer quadrant of unspecified female breast: Secondary | ICD-10-CM

## 2013-01-22 DIAGNOSIS — C50919 Malignant neoplasm of unspecified site of unspecified female breast: Secondary | ICD-10-CM

## 2013-01-22 LAB — COMPREHENSIVE METABOLIC PANEL (CC13)
Albumin: 3.5 g/dL (ref 3.5–5.0)
Alkaline Phosphatase: 58 U/L (ref 40–150)
BUN: 11.9 mg/dL (ref 7.0–26.0)
CO2: 27 mEq/L (ref 22–29)
Calcium: 9 mg/dL (ref 8.4–10.4)
Chloride: 110 mEq/L — ABNORMAL HIGH (ref 98–109)
Glucose: 90 mg/dl (ref 70–140)
Potassium: 3.7 mEq/L (ref 3.5–5.1)
Sodium: 147 mEq/L — ABNORMAL HIGH (ref 136–145)
Total Protein: 7.3 g/dL (ref 6.4–8.3)

## 2013-01-22 LAB — CBC WITH DIFFERENTIAL/PLATELET
Basophils Absolute: 0 10*3/uL (ref 0.0–0.1)
Eosinophils Absolute: 0 10*3/uL (ref 0.0–0.5)
HGB: 10.7 g/dL — ABNORMAL LOW (ref 11.6–15.9)
MCV: 99.7 fL (ref 79.5–101.0)
MONO#: 0.5 10*3/uL (ref 0.1–0.9)
MONO%: 8 % (ref 0.0–14.0)
NEUT#: 3.6 10*3/uL (ref 1.5–6.5)
Platelets: 186 10*3/uL (ref 145–400)
RBC: 3.21 10*6/uL — ABNORMAL LOW (ref 3.70–5.45)
RDW: 12.8 % (ref 11.2–14.5)
WBC: 5.6 10*3/uL (ref 3.9–10.3)

## 2013-02-14 ENCOUNTER — Other Ambulatory Visit: Payer: Self-pay | Admitting: *Deleted

## 2013-02-14 DIAGNOSIS — C50911 Malignant neoplasm of unspecified site of right female breast: Secondary | ICD-10-CM

## 2013-02-14 MED ORDER — TAMOXIFEN CITRATE 20 MG PO TABS: 20.0000 mg | ORAL_TABLET | Freq: Every day | ORAL | Status: DC

## 2013-12-11 DIAGNOSIS — E039 Hypothyroidism, unspecified: Secondary | ICD-10-CM | POA: Diagnosis not present

## 2014-01-21 ENCOUNTER — Other Ambulatory Visit: Payer: Self-pay | Admitting: *Deleted

## 2014-01-21 DIAGNOSIS — Z853 Personal history of malignant neoplasm of breast: Secondary | ICD-10-CM

## 2014-01-22 ENCOUNTER — Other Ambulatory Visit: Payer: 59

## 2014-01-22 ENCOUNTER — Ambulatory Visit: Payer: 59 | Admitting: Nurse Practitioner

## 2014-02-06 ENCOUNTER — Other Ambulatory Visit: Payer: Self-pay

## 2014-02-06 DIAGNOSIS — Z1231 Encounter for screening mammogram for malignant neoplasm of breast: Secondary | ICD-10-CM

## 2014-02-13 ENCOUNTER — Ambulatory Visit
Admission: RE | Admit: 2014-02-13 | Discharge: 2014-02-13 | Disposition: A | Discharge status: Home / Self Care | Payer: Medicare Other | Source: Ambulatory Visit

## 2014-02-13 DIAGNOSIS — Z1231 Encounter for screening mammogram for malignant neoplasm of breast: Secondary | ICD-10-CM | POA: Diagnosis not present

## 2014-03-02 ENCOUNTER — Other Ambulatory Visit: Payer: Self-pay | Admitting: *Deleted

## 2014-03-02 ENCOUNTER — Telehealth: Payer: Self-pay | Admitting: Oncology

## 2014-03-02 DIAGNOSIS — C50911 Malignant neoplasm of unspecified site of right female breast: Secondary | ICD-10-CM

## 2014-03-02 MED ORDER — TAMOXIFEN CITRATE 20 MG PO TABS: 20.0000 mg | ORAL_TABLET | Freq: Every day | ORAL | Status: DC

## 2014-03-02 NOTE — Telephone Encounter (Signed)
Spoke with husband reg apt 03/13/14

## 2014-03-13 ENCOUNTER — Encounter: Payer: Self-pay | Admitting: Nurse Practitioner

## 2014-03-13 ENCOUNTER — Other Ambulatory Visit (HOSPITAL_BASED_OUTPATIENT_CLINIC_OR_DEPARTMENT_OTHER): Payer: Medicare Other

## 2014-03-13 ENCOUNTER — Ambulatory Visit (HOSPITAL_BASED_OUTPATIENT_CLINIC_OR_DEPARTMENT_OTHER): Payer: Medicare Other | Admitting: Nurse Practitioner

## 2014-03-13 ENCOUNTER — Telehealth: Payer: Self-pay | Admitting: Nurse Practitioner

## 2014-03-13 VITALS — BP 123/57 | HR 67 | Temp 98.9°F | Resp 18 | Ht 64.5 in | Wt 149.4 lb

## 2014-03-13 DIAGNOSIS — C50911 Malignant neoplasm of unspecified site of right female breast: Secondary | ICD-10-CM

## 2014-03-13 DIAGNOSIS — L299 Pruritus, unspecified: Secondary | ICD-10-CM

## 2014-03-13 DIAGNOSIS — Z853 Personal history of malignant neoplasm of breast: Secondary | ICD-10-CM

## 2014-03-13 DIAGNOSIS — R21 Rash and other nonspecific skin eruption: Secondary | ICD-10-CM

## 2014-03-13 DIAGNOSIS — Z170 Estrogen receptor positive status [ER+]: Secondary | ICD-10-CM | POA: Diagnosis not present

## 2014-03-13 LAB — CBC WITH DIFFERENTIAL/PLATELET
BASO%: 0.7 % (ref 0.0–2.0)
Basophils Absolute: 0 10*3/uL (ref 0.0–0.1)
EOS%: 1.7 % (ref 0.0–7.0)
Eosinophils Absolute: 0.1 10*3/uL (ref 0.0–0.5)
HEMATOCRIT: 32.7 % — AB (ref 34.8–46.6)
HGB: 10.6 g/dL — ABNORMAL LOW (ref 11.6–15.9)
LYMPH%: 26 % (ref 14.0–49.7)
MCH: 32.7 pg (ref 25.1–34.0)
MCHC: 32.5 g/dL (ref 31.5–36.0)
MCV: 100.8 fL (ref 79.5–101.0)
MONO#: 0.5 10*3/uL (ref 0.1–0.9)
MONO%: 9 % (ref 0.0–14.0)
NEUT#: 3.4 10*3/uL (ref 1.5–6.5)
NEUT%: 62.6 % (ref 38.4–76.8)
PLATELETS: 216 10*3/uL (ref 145–400)
RBC: 3.24 10*6/uL — ABNORMAL LOW (ref 3.70–5.45)
RDW: 13.2 % (ref 11.2–14.5)
WBC: 5.5 10*3/uL (ref 3.9–10.3)
lymph#: 1.4 10*3/uL (ref 0.9–3.3)

## 2014-03-13 LAB — COMPREHENSIVE METABOLIC PANEL (CC13)
ALT: 9 U/L (ref 0–55)
ANION GAP: 7 meq/L (ref 3–11)
AST: 22 U/L (ref 5–34)
Albumin: 3.5 g/dL (ref 3.5–5.0)
Alkaline Phosphatase: 57 U/L (ref 40–150)
BILIRUBIN TOTAL: 0.46 mg/dL (ref 0.20–1.20)
BUN: 9.6 mg/dL (ref 7.0–26.0)
CO2: 29 meq/L (ref 22–29)
CREATININE: 0.8 mg/dL (ref 0.6–1.1)
Calcium: 9.2 mg/dL (ref 8.4–10.4)
Chloride: 107 mEq/L (ref 98–109)
Glucose: 103 mg/dl (ref 70–140)
Potassium: 4 mEq/L (ref 3.5–5.1)
Sodium: 143 mEq/L (ref 136–145)
Total Protein: 7.1 g/dL (ref 6.4–8.3)

## 2014-03-13 MED ORDER — METHYLPREDNISOLONE 4 MG PO KIT
PACK | ORAL | Status: DC
Start: 2014-03-13 — End: 2019-01-02

## 2014-03-13 NOTE — Progress Notes (Signed)
Patient ID: Erica [name], female   DOB: [birth year], 63 y.o.   MRN: [id number] ID: Erica [name]   DOB: 12-28-1950  MR#: [id number]  IPJ#:[id number]   CHIEF COMPLAINT: right breast cancer  CURRENT TREATMENT: tamoxifen daily  BREAST CANCER HISTORY: The patient herself noted a lump in her right breast in October 2005.  She brought it to her physician's attention and was set up for mammograms 03/14/04.  These showed a suspicious mass in the right breast.  It was palpable by ultrasonography.  It was well circumscribed, and measured up to 2.8 cm.  This mass was biopsied on 03/14/04 and showed ([id number]) a high-grade invasive mammary carcinoma.  The prognostic profile showed the tumor to be positive for the estrogen receptor at 6%, negative for the progesterone receptor, and 1+ on the Herceptest.    With this information, the patient was referred to Dr. Hassell Done and after appropriate discussion, he proceeded to right lumpectomy and sentinel lymph node biopsy 04/08/04.  The final pathology report ([id number]) showed a 3.2 cm grade 3 infiltrating ductal carcinoma with negative margins but evidence of vascular/lymphatic invasion, with 0 of 2 sentinel lymph nodes involved.  Her subsequent history is as detailed below.  INTERVAL HISTORY: Erica [name] returns today for follow up of her breast cancer. She has been on tamoxifen since June 2016 and is tolerating it well. She has occasional hot flashes, but nothing she cannot manage on her own. She denies vaginal changes or joint pain. The interval history is generally unremarkable. Last week she was doing yard work and subsequently broke out into a rash on her arms, neck, and the righ side of her face. This itches, but is more irritating than anything. There is no pain associated. She has applied hydrocortisone cream and taken benadryl repeatedly with minimal results.   REVIEW OF SYSTEMS: Erica [name] denies fevers, chills, nausea, vomiting, or changes in bowel or bladder habits. She has  some shortness of breath with exertion, but denies cough, fatigue, palpitations, or chest pain. She has intermittent shooting right breast pain. She denies unexplained weight loss, headaches, or dizziness. A detailed review of systems is otherwise noncontributory.   PAST MEDICAL HISTORY: Significant for osteoarthritis, status post laminectomy remotely, and hypothyroidism, followed by Dr. Laurann Montana.  FAMILY HISTORY The patient never knew her father, and was not acquainted with his family.  Her mother is living, and she has three brothers alive, one has died.  She has four sisters alive.    GYNECOLOGIC HISTORY: She is G5, P5, but two children died shortly after birth.  She has three surviving children, as described below.  Last menstrual period was about 1997.  She never took hormones.  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  Her husband, Roderic Palau, used to work at [employer].  He is now retired.  He has a history of cancer--I am not sure what type.  He was treated with chemotherapy, he says, and radiation under Dr. Valere Dross.  Their children are Virgel Manifold who works for [employer], Elberta Fortis who lives in [place] and works in a shipping yard, and [place], their daughter who lives at home with Tammy's son.  The patient is a Psychologist, forensic.   ADVANCED DIRECTIVES: Not in place  HEALTH MAINTENANCE: History  Substance Use Topics  . Smoking status: Not on file  . Smokeless tobacco: Not on file  . Alcohol Use: Not on file     Colonoscopy:  PAP:  Bone density:  Lipid panel:  Allergies  Allergen Reactions  . Gadolinium      Desc: Pt developed nausea and vomiting after receiving 17cc Multihance. Came back for repeat study and did fine with Magnevist. [name], Onset Date: [id number]     Current Outpatient Prescriptions  Medication Sig Dispense Refill  . Calcium Carbonate-Vitamin D (CALCIUM + D PO) Take 1 tablet by mouth 2 (two) times daily.       [name] etodolac (LODINE) 400 MG tablet       . gabapentin (NEURONTIN) 300 MG capsule Take 1 capsule (300 mg total) by mouth at bedtime.  90 capsule  12  . SYNTHROID 75 MCG tablet       . tamoxifen (NOLVADEX) 20 MG tablet Take 1 tablet (20 mg total) by mouth daily.  30 tablet  2   No current facility-administered medications for this visit.    OBJECTIVE: Middle-aged [place] American woman In no acute distress  There were no vitals filed for this visit.   There is no weight on file to calculate BMI.    ECOG FS: 0  Skin: warm, dry, papular erythematous rash located on bilateral arms, neck, right cheek, and left chest HEENT: sclerae anicteric, conjunctivae pink, oropharynx clear. No thrush or mucositis.  Lymph Nodes: No cervical or supraclavicular lymphadenopathy  Lungs: clear to auscultation bilaterally, no rales, wheezes, or rhonci  Heart: regular rate and rhythm  Abdomen: round, soft, non tender, positive bowel sounds  Musculoskeletal: No focal spinal tenderness, no peripheral edema  Neuro: non focal, well oriented, positive affect  Breasts: right breast status post lumpectomy and radiation. No evidence of local recurrence. Right axilla benign. Left breast unremarkable.    LAB RESULTS: Lab Results  Component Value Date   WBC 5.6 01/22/2013   NEUTROABS 3.6 01/22/2013   HGB 10.7* 01/22/2013   HCT 32.0* 01/22/2013   MCV 99.7 01/22/2013   PLT 186 01/22/2013      Chemistry      Component Value Date/Time   NA 147* 01/22/2013 1521   NA 138 11/07/2011 1457   K 3.7 01/22/2013 1521   K 3.7 11/07/2011 1457   CL 103 11/07/2011 1457   CO2 27 01/22/2013 1521   CO2 28 11/07/2011 1457   BUN 11.9 01/22/2013 1521   BUN 11 11/07/2011 1457   CREATININE 0.8 01/22/2013 1521   CREATININE 0.66 11/07/2011 1457      Component Value Date/Time   CALCIUM 9.0 01/22/2013 1521   CALCIUM 8.9 11/07/2011 1457   ALKPHOS 58 01/22/2013 1521   ALKPHOS 52 11/07/2011 1457   AST 19 01/22/2013 1521   AST 18 11/07/2011 1457   ALT 8 01/22/2013 1521    ALT 7 11/07/2011 1457   BILITOT 0.43 01/22/2013 1521   BILITOT 0.4 11/07/2011 1457       Lab Results  Component Value Date   LABCA2 22 11/07/2011    No components found with this basename: [id number]    No results found for this basename: INR,  in the last 168 hours  Urinalysis No results found for this basename: colorurine,  appearanceur,  labspec,  phurine,  glucoseu,  hgbur,  bilirubinur,  ketonesur,  proteinur,  urobilinogen,  nitrite,  leukocytesur    STUDIES: Most recent mammogram on 02/16/14 was unremarkable.  ASSESSMENT: 63 y.o. [place] woman   (1)status post right lumpectomy and sentinel lymph node biopsy in November of 2005 for a T2 N0 grade 3 invasive ductal carcinoma which was weakly estrogen receptor positive, progesterone receptor  negative, and HER2 negative,  (2) treated with cyclophosphamide and doxorubicin times 4, then weekly paclitaxel times 9,  (3) radiation, completed in July of 2006.   (4) She took anastrozole between July of 2006 and April of 2009, at which time she was switched to tamoxifen.   PLAN:  Erica [name] is doing well today as far as her breast cancer is concerned. The CBC was reviewed in detail and was stable. The CMET was not yet available to review. She is now 10 years out from her definitive surgery with no evidence of recurrent disease. She is tolerating the tamoxifen well with few complaints.   I have prescribed her for a medrol dosepak for her pruritic rash. Hopefully this provides some relief. If the rash worsens or does not clear in the next 10 days she will see her PCP.  The plan is to continue the tamoxifen until July of next year to complete 10 years of anti-estrogen therapy. At this time she will be eligible to "graduate" from follow up visits. She understands and agrees with this plan. She knows the goal of treatment in her case is cure. She has been encouraged to call with any issues that might arise before her next visit here.     Erica [name]    03/13/2014

## 2014-03-13 NOTE — Telephone Encounter (Signed)
, °

## 2014-04-06 ENCOUNTER — Other Ambulatory Visit: Payer: Self-pay | Admitting: Oncology

## 2014-04-15 DIAGNOSIS — Z23 Encounter for immunization: Secondary | ICD-10-CM | POA: Diagnosis not present

## 2014-04-15 DIAGNOSIS — M150 Primary generalized (osteo)arthritis: Secondary | ICD-10-CM | POA: Diagnosis not present

## 2014-04-15 DIAGNOSIS — Z0000 Encounter for general adult medical examination without abnormal findings: Secondary | ICD-10-CM | POA: Diagnosis not present

## 2014-04-15 DIAGNOSIS — M75100 Unspecified rotator cuff tear or rupture of unspecified shoulder, not specified as traumatic: Secondary | ICD-10-CM | POA: Diagnosis not present

## 2014-04-15 DIAGNOSIS — Z131 Encounter for screening for diabetes mellitus: Secondary | ICD-10-CM | POA: Diagnosis not present

## 2014-04-15 DIAGNOSIS — C50911 Malignant neoplasm of unspecified site of right female breast: Secondary | ICD-10-CM | POA: Diagnosis not present

## 2014-04-15 DIAGNOSIS — E039 Hypothyroidism, unspecified: Secondary | ICD-10-CM | POA: Diagnosis not present

## 2014-04-15 DIAGNOSIS — Z136 Encounter for screening for cardiovascular disorders: Secondary | ICD-10-CM | POA: Diagnosis not present

## 2014-06-01 ENCOUNTER — Other Ambulatory Visit: Payer: Self-pay | Admitting: *Deleted

## 2014-06-01 DIAGNOSIS — C50911 Malignant neoplasm of unspecified site of right female breast: Secondary | ICD-10-CM

## 2014-06-01 MED ORDER — TAMOXIFEN CITRATE 20 MG PO TABS: 20.0000 mg | ORAL_TABLET | Freq: Every day | ORAL | Status: DC

## 2014-06-19 DIAGNOSIS — E039 Hypothyroidism, unspecified: Secondary | ICD-10-CM | POA: Diagnosis not present

## 2014-07-28 DIAGNOSIS — L309 Dermatitis, unspecified: Secondary | ICD-10-CM | POA: Diagnosis not present

## 2014-10-07 ENCOUNTER — Other Ambulatory Visit: Payer: Self-pay | Admitting: Occupational Medicine

## 2014-10-07 ENCOUNTER — Ambulatory Visit: Payer: Self-pay

## 2014-10-07 DIAGNOSIS — R52 Pain, unspecified: Secondary | ICD-10-CM

## 2014-10-12 ENCOUNTER — Telehealth: Payer: Self-pay | Admitting: Oncology

## 2014-10-12 NOTE — Telephone Encounter (Signed)
Left message to confirm change for July 19 call day moved to earlier. Mailed calendar.

## 2014-10-23 DIAGNOSIS — E039 Hypothyroidism, unspecified: Secondary | ICD-10-CM | POA: Diagnosis not present

## 2014-12-03 ENCOUNTER — Telehealth: Payer: Self-pay | Admitting: Oncology

## 2014-12-03 NOTE — Telephone Encounter (Signed)
Pt's husband called to confirm apt for next visit... KJ

## 2014-12-08 ENCOUNTER — Other Ambulatory Visit (HOSPITAL_BASED_OUTPATIENT_CLINIC_OR_DEPARTMENT_OTHER): Payer: Medicare Other

## 2014-12-08 ENCOUNTER — Ambulatory Visit: Payer: Medicare Other | Admitting: Oncology

## 2014-12-08 ENCOUNTER — Ambulatory Visit (HOSPITAL_BASED_OUTPATIENT_CLINIC_OR_DEPARTMENT_OTHER): Payer: Medicare Other | Admitting: Oncology

## 2014-12-08 ENCOUNTER — Other Ambulatory Visit: Payer: Medicare Other

## 2014-12-08 VITALS — BP 138/61 | HR 62 | Temp 98.2°F | Resp 18 | Ht 64.5 in | Wt 139.3 lb

## 2014-12-08 DIAGNOSIS — C50911 Malignant neoplasm of unspecified site of right female breast: Secondary | ICD-10-CM

## 2014-12-08 DIAGNOSIS — Z853 Personal history of malignant neoplasm of breast: Secondary | ICD-10-CM

## 2014-12-08 LAB — CBC WITH DIFFERENTIAL/PLATELET
BASO%: 0.5 % (ref 0.0–2.0)
Basophils Absolute: 0 10*3/uL (ref 0.0–0.1)
EOS ABS: 0.1 10*3/uL (ref 0.0–0.5)
EOS%: 1.4 % (ref 0.0–7.0)
HCT: 32.6 % — ABNORMAL LOW (ref 34.8–46.6)
HEMOGLOBIN: 10.7 g/dL — AB (ref 11.6–15.9)
LYMPH%: 29.3 % (ref 14.0–49.7)
MCH: 32.7 pg (ref 25.1–34.0)
MCHC: 32.9 g/dL (ref 31.5–36.0)
MCV: 99.3 fL (ref 79.5–101.0)
MONO#: 0.3 10*3/uL (ref 0.1–0.9)
MONO%: 7.4 % (ref 0.0–14.0)
NEUT#: 2.9 10*3/uL (ref 1.5–6.5)
NEUT%: 61.4 % (ref 38.4–76.8)
Platelets: 223 10*3/uL (ref 145–400)
RBC: 3.28 10*6/uL — AB (ref 3.70–5.45)
RDW: 12.9 % (ref 11.2–14.5)
WBC: 4.7 10*3/uL (ref 3.9–10.3)
lymph#: 1.4 10*3/uL (ref 0.9–3.3)

## 2014-12-08 LAB — COMPREHENSIVE METABOLIC PANEL (CC13)
ALT: 9 U/L (ref 0–55)
ANION GAP: 6 meq/L (ref 3–11)
AST: 21 U/L (ref 5–34)
Albumin: 3.8 g/dL (ref 3.5–5.0)
Alkaline Phosphatase: 63 U/L (ref 40–150)
BILIRUBIN TOTAL: 0.56 mg/dL (ref 0.20–1.20)
BUN: 8.6 mg/dL (ref 7.0–26.0)
CALCIUM: 9.3 mg/dL (ref 8.4–10.4)
CO2: 29 mEq/L (ref 22–29)
CREATININE: 0.7 mg/dL (ref 0.6–1.1)
Chloride: 106 mEq/L (ref 98–109)
EGFR: >90 mL/min/{1.73_m2} (ref >90)
Glucose: 84 mg/dl (ref 70–140)
POTASSIUM: 3.7 meq/L (ref 3.5–5.1)
Sodium: 142 mEq/L (ref 136–145)
Total Protein: 7.2 g/dL (ref 6.4–8.3)

## 2014-12-08 NOTE — Progress Notes (Signed)
Patient ID: Erica [name], female   DOB: 05/24/1950, 64 y.o.   MRN: [id number] ID: Erica [name]   DOB: 04-08-1951  MR#: [id number]  GUR#:[id number]   PCP: Osborne Casco, MD GYN: SUJohnathan Hausen MD OTHER MD:  CHIEF COMPLAINT: right breast cancer  CURRENT TREATMENT: tcompleted tamoxifen July 2016  BREAST CANCER HISTORY: The patient herself noted a lump in her right breast in October 2005.  She brought it to her physician's attention and was set up for mammograms 03/14/04.  These showed a suspicious mass in the right breast.  It was palpable by ultrasonography.  It was well circumscribed, and measured up to 2.8 cm.  This mass was biopsied on 03/14/04 and showed ([id number]) a high-grade invasive mammary carcinoma.  The prognostic profile showed the tumor to be positive for the estrogen receptor at 6%, negative for the progesterone receptor, and 1+ on the Herceptest.    With this information, the patient was referred to Dr. Hassell Done and after appropriate discussion, he proceeded to right lumpectomy and sentinel lymph node biopsy 04/08/04.  The final pathology report ([id number]) showed a 3.2 cm grade 3 infiltrating ductal carcinoma with negative margins but evidence of vascular/lymphatic invasion, with 0 of 2 sentinel lymph nodes involved.  Her subsequent history is as detailed below.  INTERVAL HISTORY: Erica [name] returns today for follow up of her breast cancer. Her husband Roderic Palau initially came with her beginning at the leave because he has trouble putting up with air conditioning. Erica [name] continues on tamoxifen, which she is tolerating well. She does have some hot flashes from the medication. She feels very ready to "graduate" from breast cancer follow-up  REVIEW OF SYSTEMS: Erica [name] continues to work in [employer]. She has arthritis chiefly molding the lower legs. She has some night sweats and hot flashes. She feels her vision is a little bit more blurry than it used to be. She can  have problems with urination at times, recently had a rash which took her to the dermatologist, but that has cleared, and does have chronic back pain and sometimes difficulty walking. She rarely has problems with hot flashes. She denies problems with nausea or vomiting. A detailed review of systems today was otherwise stable  PAST MEDICAL HISTORY: Significant for osteoarthritis, status post laminectomy remotely, and hypothyroidism, followed by Dr. Laurann Montana.  FAMILY HISTORY The patient never knew her father, and was not acquainted with his family.  Her mother is living, and she has three brothers alive, one has died.  She has four sisters alive.    GYNECOLOGIC HISTORY: She is G5, P5, but two children died shortly after birth.  She has three surviving children, as described below.  Last menstrual period was about 1997.  She never took hormones.  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  Her husband, Roderic Palau, used to work at [employer].  He is now retired.  He has a history of cancer--I am not sure what type.  He was treated with chemotherapy, he says, and radiation under Dr. Valere Dross.  Their children are Virgel Manifold who works for [employer], Elberta Fortis who lives in [place] and works in a shipping yard, and [place], their daughter who lives at home with Tammy's son.  The patient is a Psychologist, forensic.   ADVANCED DIRECTIVES: Not in place  HEALTH MAINTENANCE: History  Substance Use Topics  . Smoking status: Never Smoker   . Smokeless tobacco: Not on file  . Alcohol Use:  Not on file     Colonoscopy:  PAP:  Bone density:  Lipid panel:  Allergies  Allergen Reactions  . Gadolinium      Desc: Pt developed nausea and vomiting after receiving 17cc Multihance. Came back for repeat study and did fine with Magnevist. Erica [name], Onset Date: [id number]     Current Outpatient Prescriptions  Medication Sig Dispense Refill  . Calcium Carbonate-Vitamin D  (CALCIUM + D PO) Take 1 tablet by mouth 2 (two) times daily.    [name] etodolac (LODINE) 400 MG tablet Take 400 mg by mouth.     . gabapentin (NEURONTIN) 300 MG capsule Take 1 capsule (300 mg total) by mouth at bedtime. 90 capsule 12  . methylPREDNISolone (MEDROL DOSEPAK) 4 MG tablet follow package directions 21 tablet 0  . SYNTHROID 75 MCG tablet Take 75 mcg by mouth daily before breakfast.     . tamoxifen (NOLVADEX) 20 MG tablet Take 1 tablet (20 mg total) by mouth daily. 30 tablet 6   No current facility-administered medications for this visit.    OBJECTIVE: Middle-aged [place] American woman who appears older than stated age 2 Vitals:   12/08/14 1223  BP: 138/61  Pulse: 62  Temp: 98.2 F (36.8 C)  Resp: 18     Body mass index is 23.55 kg/(m^2).    ECOG FS: 1  Sclerae unicteric, EOMs intact Oropharynx clear and moist No cervical or supraclavicular adenopathy Lungs no rales or rhonchi Heart regular rate and rhythm Abd soft, nontender, positive bowel sounds MSK no focal spinal tenderness, no upper extremity lymphedema Neuro: nonfocal, well oriented, appropriate affect Breasts: The right breast is status post lumpectomy and radiation. There is no evidence of local recurrence. The right axilla is benign. The left breast is unremarkable    LAB RESULTS: Lab Results  Component Value Date   WBC 4.7 12/08/2014   NEUTROABS 2.9 12/08/2014   HGB 10.7* 12/08/2014   HCT 32.6* 12/08/2014   MCV 99.3 12/08/2014   PLT 223 12/08/2014      Chemistry      Component Value Date/Time   NA 142 12/08/2014 1153   NA 138 11/07/2011 1457   K 3.7 12/08/2014 1153   K 3.7 11/07/2011 1457   CL 103 11/07/2011 1457   CO2 29 12/08/2014 1153   CO2 28 11/07/2011 1457   BUN 8.6 12/08/2014 1153   BUN 11 11/07/2011 1457   CREATININE 0.7 12/08/2014 1153   CREATININE 0.66 11/07/2011 1457      Component Value Date/Time   CALCIUM 9.3 12/08/2014 1153   CALCIUM 8.9 11/07/2011 1457   ALKPHOS 63  12/08/2014 1153   ALKPHOS 52 11/07/2011 1457   AST 21 12/08/2014 1153   AST 18 11/07/2011 1457   ALT 9 12/08/2014 1153   ALT 7 11/07/2011 1457   BILITOT 0.56 12/08/2014 1153   BILITOT 0.4 11/07/2011 1457       Lab Results  Component Value Date   LABCA2 22 11/07/2011    No components found for: [id number]  No results for input(s): INR in the last 168 hours.  Urinalysis No results found for: COLORURINE  STUDIES: Most recent mammogram on 02/16/14 was unremarkable.  ASSESSMENT: 64 y.o. Erica [name] woman   (1)status post right lumpectomy and sentinel lymph node biopsy in November of 2005 for a T2 N0 grade 3 invasive ductal carcinoma which was weakly estrogen receptor positive, progesterone receptor negative, and HER2 negative,  (2) treated with cyclophosphamide and doxorubicin times 4, then weekly paclitaxel  times 9,  (3) radiation, completed in July of 2006.   (4) She took anastrozole between July of 2006 and April of 2009, at which time she was switched to tamoxifen.   (5) completed ten years of anti-estrogen therapy Jul;y 2016  PLAN:  Samyia has completed 10 years of antiestrogen therapy. We do not have data for continuing beyond 10 years: It might be helpful, harmful, or might make no difference. Accordingly we are stopping tamoxifen at this point.  I don't anticipate stopping tamoxifen will make much changed to her functional status, aside from the fact that she may have your hot flashes. I don't think the arthritis symptoms she is experiencing are related to that medication.  As far as breast cancer follow-up is concerned all Erica [name] will need is yearly mammography, which she usually gets in September, and a yearly physician breast exam.  I will be glad to see Erica [name] again at anytime in the future if I when the need arises, but as of now we're making no further routine return  appointments for her here.  Takeysha Bonk C    12/08/2014

## 2015-02-23 DIAGNOSIS — L300 Nummular dermatitis: Secondary | ICD-10-CM | POA: Diagnosis not present

## 2015-02-24 DIAGNOSIS — L309 Dermatitis, unspecified: Secondary | ICD-10-CM | POA: Diagnosis not present

## 2015-02-24 DIAGNOSIS — J309 Allergic rhinitis, unspecified: Secondary | ICD-10-CM | POA: Diagnosis not present

## 2015-02-24 DIAGNOSIS — Z23 Encounter for immunization: Secondary | ICD-10-CM | POA: Diagnosis not present

## 2015-04-27 DIAGNOSIS — M150 Primary generalized (osteo)arthritis: Secondary | ICD-10-CM | POA: Diagnosis not present

## 2015-04-27 DIAGNOSIS — E039 Hypothyroidism, unspecified: Secondary | ICD-10-CM | POA: Diagnosis not present

## 2015-04-27 DIAGNOSIS — Z0000 Encounter for general adult medical examination without abnormal findings: Secondary | ICD-10-CM | POA: Diagnosis not present

## 2015-04-27 DIAGNOSIS — Z131 Encounter for screening for diabetes mellitus: Secondary | ICD-10-CM | POA: Diagnosis not present

## 2015-04-27 DIAGNOSIS — Z23 Encounter for immunization: Secondary | ICD-10-CM | POA: Diagnosis not present

## 2015-04-27 DIAGNOSIS — Z853 Personal history of malignant neoplasm of breast: Secondary | ICD-10-CM | POA: Diagnosis not present

## 2016-01-25 ENCOUNTER — Ambulatory Visit (HOSPITAL_COMMUNITY)
Admission: RE | Admit: 2016-01-25 | Discharge: 2016-01-25 | Disposition: A | Discharge status: Home / Self Care | Payer: Medicare Other | Source: Ambulatory Visit | Attending: Family Medicine | Admitting: Family Medicine

## 2016-01-25 ENCOUNTER — Ambulatory Visit (INDEPENDENT_AMBULATORY_CARE_PROVIDER_SITE_OTHER): Payer: Worker's Compensation | Admitting: Family Medicine

## 2016-01-25 ENCOUNTER — Emergency Department (HOSPITAL_COMMUNITY)
Admission: EM | Admit: 2016-01-25 | Discharge: 2016-01-25 | Discharge status: Absent without leave | Payer: Medicare Other

## 2016-01-25 VITALS — BP 110/68 | HR 72 | Temp 98.8°F | Resp 16 | Ht 64.0 in | Wt 143.4 lb

## 2016-01-25 DIAGNOSIS — S0990XA Unspecified injury of head, initial encounter: Secondary | ICD-10-CM | POA: Diagnosis not present

## 2016-01-25 DIAGNOSIS — X58XXXA Exposure to other specified factors, initial encounter: Secondary | ICD-10-CM | POA: Diagnosis not present

## 2016-01-25 MED ORDER — TRAMADOL HCL 50 MG PO TABS: 50.0000 mg | ORAL_TABLET | Freq: Three times a day (TID) | ORAL | 0 refills | Status: DC | PRN

## 2016-01-25 NOTE — Patient Instructions (Addendum)
Go to [facility] for CT scan.  If you began to experiencing weakness, dizziness, slurred speech, facial drooping, chest pain, return for care or go directly to emergency department.  Return for follow-up in 7 days.  IF you received an x-ray today, you will receive an invoice from [facility]. Please contact [facility] at [phone] with questions or concerns regarding your invoice.   IF you received labwork today, you will receive an invoice from [employer]. Please contact [facility] at [phone] with questions or concerns regarding your invoice.   Our billing staff will not be able to assist you with questions regarding bills from these companies.  You will be contacted with the lab results as soon as they are available. The fastest way to get your results is to activate your My Chart account. Instructions are located on the last page of this paperwork. If you have not heard from [place] regarding the results in 2 weeks, please contact this office.   Head Injury, Adult You have a head injury. Headaches and throwing up (vomiting) are common after a head injury. It should be easy to wake up from sleeping. Sometimes you must stay in the hospital. Most problems happen within the first 24 hours. Side effects may occur up to 7-10 days after the injury.  WHAT ARE THE TYPES OF HEAD INJURIES? Head injuries can be as minor as a bump. Some head injuries can be more severe. More severe head injuries include:  A jarring injury to the brain (concussion).  A bruise of the brain (contusion). This mean there is bleeding in the brain that can cause swelling.  A cracked skull (skull fracture).  Bleeding in the brain that collects, clots, and forms a bump (hematoma). WHEN SHOULD I GET HELP RIGHT AWAY?   You are confused or sleepy.  You cannot be woken up.  You feel sick to your stomach (nauseous) or keep throwing up (vomiting).  Your dizziness or  unsteadiness is getting worse.  You have very bad, lasting headaches that are not helped by medicine. Take medicines only as told by your doctor.  You cannot use your arms or legs like normal.  You cannot walk.  You notice changes in the black spots in the center of the colored part of your eye (pupil).  You have clear or bloody fluid coming from your nose or ears.  You have trouble seeing. During the next 24 hours after the injury, you must stay with someone who can watch you. This person should get help right away (call 911 in the [place] if you start to shake and are not able to control it (have seizures), you pass out, or you are unable to wake up. HOW CAN I PREVENT A HEAD INJURY IN THE FUTURE?  Wear seat belts.  Wear a helmet while bike riding and playing sports like football.  Stay away from dangerous activities around the house. WHEN CAN I RETURN TO NORMAL ACTIVITIES AND ATHLETICS? See your doctor before doing these activities. You should not do normal activities or play contact sports until 1 week after the following symptoms have stopped:  Headache that does not go away.  Dizziness.  Poor attention.  Confusion.  Memory problems.  Sickness to your stomach or throwing up.  Tiredness.  Fussiness.  Bothered by bright lights or loud noises.  Anxiousness or depression.  Restless sleep. MAKE SURE YOU:   Understand these instructions.  Will watch your condition.  Will get help right away  if you are not doing well or get worse.   This information is not intended to replace advice given to you by your health care provider. Make sure you discuss any questions you have with your health care provider.   Document Released: 04/20/2008 Document Revised: 05/29/2014 Document Reviewed: 01/13/2013 Elsevier Interactive Patient Education [employer].

## 2016-01-25 NOTE — Progress Notes (Signed)
.   Erica [name] 06-15-1950 65 y.o.   Chief Complaint  Patient presents with  . Head Injury    today at 1300     Date of Injury: 01/25/2016  History of Present Illness: 65 year old female presents for evaluation of work-related complaint which occurred at [employer] [facility] [employer] [facility]. She reports the following incident:  She was sweeping and student swung a door open which knocked her down.  She fell and hit her head.  She is uncertain if she hit her head on the floor or wall.  The patient reports immediately felling  pain on the top of her head after impact.  She reports stumbling when she stood up.  She denies any dizziness or lightheadedness presently.   She reports that her husband brought her to [facility] for evaluation.  Patient reports notifying her supervisor and the campus nurse of injury.  Denies taking any medication since incident occurred.    Review of Systems  Constitutional: Negative.   HENT:       See history of present illness  Respiratory: Negative.   Cardiovascular: Negative.   Neurological: Positive for headaches. Negative for dizziness, tingling, tremors, speech change, focal weakness, seizures and loss of consciousness.  Psychiatric/Behavioral: Negative.    Current medications and allergies reviewed and updated. Past medical history, family history, social history have been reviewed and updated.   Physical Exam  Constitutional: She is oriented to person, place, and time and well-developed, well-nourished, and in no distress.  HENT:  Head: Normocephalic and atraumatic.  Right Ear: External ear normal.  Left Ear: External ear normal.  Mouth/Throat: Oropharynx is clear and moist.  Scalp negative for laceration or edema.  Negative for superficial tenderness with palpation of the cranium.  Eyes: Conjunctivae and EOM are normal. Pupils are equal, round, and reactive to light.  Neck: Normal range of motion.  Cardiovascular: Normal rate, normal heart sounds and intact distal  pulses.   Pulmonary/Chest: Effort normal and breath sounds normal.  Musculoskeletal: Normal range of motion.  Neurological: She is alert and oriented to person, place, and time. Gait normal. GCS score is 15.  Negative of nystagmus. Speech is clear and concise. Cerebellar function intact.  Skin: Skin is warm and dry.  Psychiatric: Mood, memory, affect and judgment normal.    Vitals:   01/25/16 1522  BP: 110/68  Pulse: 72  Resp: 16  Temp: 98.8 F (37.1 C)   Assessment and Plan: 1. Head injury, initial encounter, patient is alert, oriented, and physical exam is unremarkable.  Due to age of patient and the impact of the fall with head injury described by the patient, I would consider it reasonable to obtain a CT scan to rule out any acute head injury. Plan: - CT Head Wo Contrast-negative of acute findings. -Tramadol 50 mg, every  8 hours as needed for acute head pain. Follow-up for re-evaluation in 7 days.  Carroll Sage. Kenton Kingfisher, MSN, FNP-C [facility]

## 2016-01-27 ENCOUNTER — Encounter: Payer: Self-pay | Admitting: Family Medicine

## 2016-01-27 NOTE — Progress Notes (Signed)
Patient was notified that CT results were normal and provided a return to work note. See below.    [facility] [address] [place], [place]  [address] Phone:  [phone]   Fax:  [phone] January 26, 2016   Erica [name] [address] [place] [address]      If you have any questions or concerns, please don't hesitate to call.  Sincerely,    Molli Barrows, FNP  [facility], [id number]                                           1

## 2018-03-13 ENCOUNTER — Other Ambulatory Visit: Payer: Self-pay | Admitting: Family Medicine

## 2018-03-13 DIAGNOSIS — Z1231 Encounter for screening mammogram for malignant neoplasm of breast: Secondary | ICD-10-CM

## 2018-03-18 ENCOUNTER — Ambulatory Visit
Admission: RE | Admit: 2018-03-18 | Discharge: 2018-03-18 | Disposition: A | Discharge status: Home / Self Care | Payer: Medicare Other | Source: Ambulatory Visit | Attending: Family Medicine | Admitting: Family Medicine

## 2018-03-18 DIAGNOSIS — Z1231 Encounter for screening mammogram for malignant neoplasm of breast: Secondary | ICD-10-CM

## 2018-03-25 ENCOUNTER — Ambulatory Visit: Payer: Self-pay

## 2018-12-04 ENCOUNTER — Other Ambulatory Visit: Payer: Self-pay | Admitting: Family Medicine

## 2018-12-04 DIAGNOSIS — Z1231 Encounter for screening mammogram for malignant neoplasm of breast: Secondary | ICD-10-CM

## 2018-12-04 DIAGNOSIS — M810 Age-related osteoporosis without current pathological fracture: Secondary | ICD-10-CM

## 2018-12-11 ENCOUNTER — Other Ambulatory Visit: Payer: Self-pay | Admitting: Obstetrics and Gynecology

## 2018-12-11 ENCOUNTER — Other Ambulatory Visit (HOSPITAL_COMMUNITY)
Admission: RE | Admit: 2018-12-11 | Discharge: 2018-12-11 | Disposition: A | Discharge status: Home / Self Care | Payer: Medicare Other | Source: Ambulatory Visit | Attending: Obstetrics and Gynecology | Admitting: Obstetrics and Gynecology

## 2018-12-11 DIAGNOSIS — Z124 Encounter for screening for malignant neoplasm of cervix: Secondary | ICD-10-CM | POA: Insufficient documentation

## 2018-12-11 DIAGNOSIS — N898 Other specified noninflammatory disorders of vagina: Secondary | ICD-10-CM | POA: Diagnosis not present

## 2018-12-14 LAB — CYTOLOGY - PAP
Diagnosis: NEGATIVE
HPV: NOT DETECTED

## 2018-12-26 ENCOUNTER — Other Ambulatory Visit: Payer: Self-pay | Admitting: Obstetrics and Gynecology

## 2019-01-02 ENCOUNTER — Encounter: Payer: Self-pay | Admitting: Gynecologic Oncology

## 2019-01-02 ENCOUNTER — Inpatient Hospital Stay: Payer: Medicare Other | Attending: Gynecologic Oncology | Admitting: Gynecologic Oncology

## 2019-01-02 ENCOUNTER — Other Ambulatory Visit: Payer: Self-pay

## 2019-01-02 VITALS — BP 152/56 | HR 62 | Temp 98.9°F | Resp 16 | Ht 62.5 in | Wt 136.0 lb

## 2019-01-02 DIAGNOSIS — E039 Hypothyroidism, unspecified: Secondary | ICD-10-CM | POA: Diagnosis not present

## 2019-01-02 DIAGNOSIS — M1990 Unspecified osteoarthritis, unspecified site: Secondary | ICD-10-CM | POA: Diagnosis not present

## 2019-01-02 DIAGNOSIS — Z9221 Personal history of antineoplastic chemotherapy: Secondary | ICD-10-CM | POA: Insufficient documentation

## 2019-01-02 DIAGNOSIS — Z79899 Other long term (current) drug therapy: Secondary | ICD-10-CM | POA: Diagnosis not present

## 2019-01-02 DIAGNOSIS — Z853 Personal history of malignant neoplasm of breast: Secondary | ICD-10-CM | POA: Diagnosis not present

## 2019-01-02 DIAGNOSIS — Z7983 Long term (current) use of bisphosphonates: Secondary | ICD-10-CM | POA: Insufficient documentation

## 2019-01-02 DIAGNOSIS — Z923 Personal history of irradiation: Secondary | ICD-10-CM | POA: Diagnosis not present

## 2019-01-02 DIAGNOSIS — M8580 Other specified disorders of bone density and structure, unspecified site: Secondary | ICD-10-CM | POA: Diagnosis not present

## 2019-01-02 DIAGNOSIS — D4959 Neoplasm of unspecified behavior of other genitourinary organ: Secondary | ICD-10-CM | POA: Insufficient documentation

## 2019-01-02 DIAGNOSIS — C541 Malignant neoplasm of endometrium: Secondary | ICD-10-CM | POA: Insufficient documentation

## 2019-01-02 NOTE — Patient Instructions (Signed)
Plan to have a PET scan to evaluate for signs of spread of the cancer then plan to follow up in the office with Dr. Skeet Latch to discuss the biopsy results and PET scan results.  Please call for any questions or concerns.

## 2019-01-02 NOTE — Progress Notes (Addendum)
Consult Note: Gyn-Onc  Consult was requested by Dr. Landry Mellow for the evaluation of Erica [name] 68 y.o. female  CC: Grade three endometrial cancer  Assessment/Plan: [name] is a 68 y.o with grade 3 endometrial cancer and a history of 10 years of tamoxifen as adjuvant treatment for right-sided breast cancer.  On examination today the cervix had a mottled appearance concerning for metastatic subserosal disease.  Biopsy was collected. A PET scan has been ordered to assess for metastatic disease.Erica [name] will follow-up on August 20 to discuss the results of the cervical biopsy and the PET scan.  If these tests do not demonstrate metastatic disease the plan is for surgical staging on January 16, 2019.  The patient's daughter participated in the visit by telephone  HPI: [name] is a 68 y.o.  G5 P3 with a history of right breast cancer who received adjuvant tamoxifen for 10 years, 2000 62,016.[name]  She reports vaginal spotting with pain that began on December 07, 2018.  A transvaginal ultrasound was notable for a uterus measuring 8.3 x 4.4 x 5.1 cm with an endometrial stripe of 1.53 cm.  Neither ovary was visualized.  An endometrial biopsy was obtained on December 26, 2018 and returned with a grade 3 endometrioid adenocarcinoma.    The patient denies nausea vomiting or changes in bowel or bladder habits.  She reports longstanding bladder urgency and a good appetite and there is been no recent weight loss.  She states that she is very active and involved in church activities and manages her own activities of daily living.  Her family history is notable for sister with breast cancer diagnosed in the 15s.  Review of Systems:  Constitutional  Feels well,   Cardiovascular  No chest pain, shortness of breath, or edema  Pulmonary  No cough or wheeze.  Gastro Intestinal  No nausea, vomitting, or diarrhoea. No bright red blood per rectum, no abdominal pain, change in bowel  movement, or constipation.  Genito Urinary  No frequency, urgency, dysuria, urge incontinence occasional vaginal spotting musculo Skeletal  No myalgia, arthralgia, joint swelling or pain  Neurologic  No weakness, numbness, change in gait,  Psychology  No depression, anxiety, insomnia.    Current Meds:  Outpatient Encounter Medications as of 01/02/2019  Medication Sig  . Calcium Carbonate-Vitamin D (CALCIUM + D PO) Take 1 tablet by mouth 2 (two) times daily.  [name] etodolac (LODINE) 400 MG tablet Take 400 mg by mouth.   . gabapentin (NEURONTIN) 300 MG capsule Take 1 capsule (300 mg total) by mouth at bedtime.  . methylPREDNISolone (MEDROL DOSEPAK) 4 MG tablet follow package directions  . SYNTHROID 75 MCG tablet Take 75 mcg by mouth daily before breakfast.   . traMADol (ULTRAM) 50 MG tablet Take 1 tablet (50 mg total) by mouth every 8 (eight) hours as needed.   No facility-administered encounter medications on file as of 01/02/2019.     Allergy:  Allergies  Allergen Reactions  . Gadolinium      Desc: Pt developed nausea and vomiting after receiving 17cc Multihance. Came back for repeat study and did fine with Magnevist. [name], Onset Date: [id number]     Social Hx:   Social History   Socioeconomic History  . Marital status: Married    Spouse name: Not on file  . Number of children: Not on file  . Years of education: Not on file  . Highest education level: Not on file  Occupational History  . Not on file  Social Needs  . Financial resource strain: Not on file  . Food insecurity    Worry: Not on file    Inability: Not on file  . Transportation needs    Medical: Not on file    Non-medical: Not on file  Tobacco Use  . Smoking status: Never Smoker  Substance and Sexual Activity  . Alcohol use: Not on file  . Drug use: Not on file  . Sexual activity: Not on file  Lifestyle  . Physical activity    Days per week: Not on file    Minutes per session: Not on file  .  Stress: Not on file  Relationships  . Social Herbalist on phone: Not on file    Gets together: Not on file    Attends religious service: Not on file    Active member of club or organization: Not on file    Attends meetings of clubs or organizations: Not on file    Relationship status: Not on file  . Intimate partner violence    Fear of current or ex partner: Not on file    Emotionally abused: Not on file    Physically abused: Not on file    Forced sexual activity: Not on file  Other Topics Concern  . Not on file  Social History Narrative  . Not on file    Past Surgical Hx:  Past Surgical History:  Procedure Laterality Date  . BREAST LUMPECTOMY Right 2006   With Lymphnode discetion  . NM I-131 ABLATION W/THYROGEN ([place] HX)  1970  . OTHER SURGICAL HISTORY  1991   HNP with Fusion   . TUBAL LIGATION Bilateral 1975    Past Medical Hx:  Past Medical History:  Diagnosis Date  . Allergy   . Arthritis   . Breast cancer ([place])    right/2006  . Osteoarthritis   . Osteopenia   . Thyroid disease    Hypothyroidism  Patient is unaware of the stage of the breast cancer but states that she received lumpectomy sentinel lymph node dissection adjuvant radiation and chemotherapy followed by tamoxifen treatment for approximately 10 years  Past Gynecological History: Gravida 5 para 3 menopause occurred in the 72s denies any history of abnormal Pap and denies use of any hormonal agents for birth control  family Hx:  Family History  Problem Relation Age of Onset  . Hypertension Mother   . Heart disease Mother   . Cirrhosis Father   . Colon cancer Neg Hx   . Liver disease Neg Hx   . Colon polyps Neg Hx     Vitals:  Blood pressure (!) 152/56, pulse 62, temperature 98.9 F (37.2 C), temperature source Temporal, resp. rate 16, height 5' 2.5" (1.588 m), weight 136 lb (61.7 kg).  Physical Exam: WD in NAD Neck  Supple NROM, without any enlargements.  Lymph Node Survey No  cervical supraclavicular or inguinal adenopathy Cardiovascular  Pulse normal rate, regularity and rhythm. S1 and S2 normal.  Lungs  Clear to auscultation bilaterally, without wheezes/crackles/rhonchi.  Distant breath sounds  Skin :no rash/lesions/breakdown  Psychiatry  Alert and oriented appropriate mood affect speech and reasoning. Abdomen  Normoactive bowel sounds, abdomen soft, non-tender. Surgical  sites intact without evidence of hernia.  Midline vertical incision from tubal ligation Back No CVA tenderness Genito Urinary  Vulva/vagina: Normal external female genitalia.  No lesions. No discharge or bleeding.  Bladder/urethra:  No lesions or  masses  Vagina: Atrophic without masses  Cervix: Mottled in appearance concerning for subserosal   metastatic disease   Uterus: Mobile proximately 10 cm, no parametrial involvement or nodularity.  Adnexa: No palpable masses or cul-de-sac nodularity. Rectal  Patient declined Extremities  No bilateral cyanosis, clubbing or edema.    CERVICAL BIOPSY  Procedure explained to patient.  The cervix was prepped with betadine.   A biopsy was taken from the anterior cervix.  There was minimal  bleeding that was  controlled with silver nitrate and pressure for a few  minutes.  . Patient tolerated the procedure well.   Janie Morning, MD, PhD 01/02/2019, 11:14 AM

## 2019-01-08 ENCOUNTER — Encounter (HOSPITAL_COMMUNITY)
Admission: RE | Admit: 2019-01-08 | Discharge: 2019-01-08 | Disposition: A | Discharge status: Home / Self Care | Payer: Medicare Other | Source: Ambulatory Visit | Attending: Gynecologic Oncology | Admitting: Gynecologic Oncology

## 2019-01-08 ENCOUNTER — Other Ambulatory Visit: Payer: Self-pay

## 2019-01-08 DIAGNOSIS — Z79899 Other long term (current) drug therapy: Secondary | ICD-10-CM | POA: Insufficient documentation

## 2019-01-08 DIAGNOSIS — C541 Malignant neoplasm of endometrium: Secondary | ICD-10-CM

## 2019-01-08 DIAGNOSIS — Z853 Personal history of malignant neoplasm of breast: Secondary | ICD-10-CM | POA: Insufficient documentation

## 2019-01-08 DIAGNOSIS — E039 Hypothyroidism, unspecified: Secondary | ICD-10-CM | POA: Insufficient documentation

## 2019-01-08 LAB — GLUCOSE, CAPILLARY: Glucose-Capillary: 84 mg/dL (ref 70–99)

## 2019-01-08 MED ORDER — FLUDEOXYGLUCOSE F - 18 (FDG) INJECTION
6.8600 | Freq: Once | INTRAVENOUS | Status: AC
  Administered 2019-01-08: 16:00:00 6.86 via INTRAVENOUS

## 2019-01-08 NOTE — Progress Notes (Signed)
GYN ONCOLOGY OFFICE TREATMENT COUNSELLING   Erica [name] 68 y.o. female  CC: Grade three endometrial cancer  Assessment/Plan: [name] is a 68 y.o with grade 3 endometrial cancer and a history of 10 years of tamoxifen as adjuvant treatment for right-sided breast cancer.   A PET scan from 01/08/2019 is negative for  local or distant metastatic disease.  I extensively reviewed that the risks of robotic hysterectomy bilateral salpingo-oophorectomy, sentinel lymph node sampling of pelvic periaortic lymph nodes using icsocyanine green dye, possible lymph node dissection possible laparotomy include infection, bleeding, damage to nearby organs including bowel, bladder, vessels, nerves, and ureters. We discussed postoperative risks including infection, PE/ DVT, and lymphedema. We reviewed possible need for conversion to open laparotomy, need for possible blood transfusion. I discussed positioning during surgery of trendelenberg and risks of minor facial swelling and care we take in preoperative positioning. We also reviewed possible need for further treatment such as radiation or chemotherapy based on final pathology. Expected postoperative recovery was also discussed with the patient and her daughter who participated via [place].  Surgery scheduled for January 16, 2019 they are aware that Dr. Denman George will be the surgeon.  Also counseled that the standard is for discharge on the day of surgery however this will be based on postoperative recovery.   HPI: [name] is a 68 y.o.  G5 P3 with a history of right breast cancer who received adjuvant tamoxifen for 10 years, 2000 62,016.[name]  She reports vaginal spotting with pain that began on December 07, 2018.  A transvaginal ultrasound was notable for a uterus measuring 8.3 x 4.4 x 5.1 cm with an endometrial stripe of 1.53 cm.  Neither ovary was visualized.  An endometrial biopsy was obtained on December 26, 2018 and returned with a grade 3  endometrioid adenocarcinoma.    The cervix was noted to be unusual in appearance and a biopsy was collected and returned as a benign endocervical polyp.  Pet imaging from January 07, 2019 is negative for evidence of local or distant metastatic disease.  Review of Systems:  Constitutional  Feels well,   Cardiovascular  No chest pain, shortness of breath, or edema  Pulmonary  No cough or wheeze.  Gastro Intestinal  No nausea, vomitting, or diarrhoea. No bright red blood per rectum, no abdominal pain, change in bowel movement, or constipation.  Genito Urinary  No frequency, urgency, dysuria, urge incontinence occasional vaginal spotting musculo Skeletal  No myalgia, arthralgia, joint swelling or pain  Neurologic  No weakness, numbness, change in gait,  Psychology  No depression, anxiety, insomnia.    Current Meds:  Outpatient Encounter Medications as of 01/09/2019  Medication Sig  . acetaminophen (TYLENOL) 500 MG tablet Take 500 mg by mouth every 6 (six) hours as needed.  [name] alendronate (FOSAMAX) 70 MG tablet TK 1 T PO 1 TIME A WK  . Calcium Carb-Cholecalciferol (CALCIUM 600+D3) 600-200 MG-UNIT TABS Take 1 tablet by mouth daily.  [name] etodolac (LODINE) 400 MG tablet Take 400 mg by mouth.   . fluticasone (FLONASE) 50 MCG/ACT nasal spray Place 1 spray into both nostrils daily.  [name] loratadine (CLARITIN) 10 MG tablet Take 10 mg by mouth daily.  . Multiple Vitamin (MULTIVITAMIN) tablet Take 1 tablet by mouth daily.  [name] SYNTHROID 75 MCG tablet Take 75 mcg by mouth daily before breakfast.    No facility-administered encounter medications on file as of 01/09/2019.     Allergy:  Allergies  Allergen Reactions  .  Gadolinium      Desc: Pt developed nausea and vomiting after receiving 17cc Multihance. Came back for repeat study and did fine with Magnevist. [name], Onset Date: [id number]     Social Hx:   Social History   Socioeconomic History  . Marital status: Married    Spouse name: Not  on file  . Number of children: Not on file  . Years of education: Not on file  . Highest education level: Not on file  Occupational History  . Not on file  Social Needs  . Financial resource strain: Not on file  . Food insecurity    Worry: Not on file    Inability: Not on file  . Transportation needs    Medical: Not on file    Non-medical: Not on file  Tobacco Use  . Smoking status: Never Smoker  . Smokeless tobacco: Never Used  Substance and Sexual Activity  . Alcohol use: Never    Frequency: Never  . Drug use: Never  . Sexual activity: Yes  Lifestyle  . Physical activity    Days per week: Not on file    Minutes per session: Not on file  . Stress: Not on file  Relationships  . Social Herbalist on phone: Not on file    Gets together: Not on file    Attends religious service: Not on file    Active member of club or organization: Not on file    Attends meetings of clubs or organizations: Not on file    Relationship status: Not on file  . Intimate partner violence    Fear of current or ex partner: Not on file    Emotionally abused: Not on file    Physically abused: Not on file    Forced sexual activity: Not on file  Other Topics Concern  . Not on file  Social History Narrative  . Not on file    Past Surgical Hx:  Past Surgical History:  Procedure Laterality Date  . BREAST LUMPECTOMY Right 2006   With Lymphnode discetion  . NM I-131 ABLATION W/THYROGEN ([place] HX)  1970  . OTHER SURGICAL HISTORY  1991   HNP with Fusion   . TUBAL LIGATION Bilateral 1975    Past Medical Hx:  Past Medical History:  Diagnosis Date  . Allergy   . Arthritis   . Breast cancer ([place])    right/2006  . Osteoarthritis   . Osteopenia   . Thyroid disease    Hypothyroidism  Patient is unaware of the stage of the breast cancer but states that she received lumpectomy sentinel lymph node dissection adjuvant radiation and chemotherapy followed by tamoxifen treatment for  approximately 10 years  Past Gynecological History: Gravida 5 para 3 menopause occurred in the 29s denies any history of abnormal Pap and denies use of any hormonal agents for birth control  family Hx:  Family History  Problem Relation Age of Onset  . Hypertension Mother   . Heart disease Mother   . Cirrhosis Father   . Colon cancer Neg Hx   . Liver disease Neg Hx   . Colon polyps Neg Hx     Vitals:  Blood pressure (!) 160/65, pulse 70, temperature 98.5 F (36.9 C), temperature source Oral, resp. rate 16, height 5' 2.5" (1.588 m), weight 135 lb 9.6 oz (61.5 kg), SpO2 100 %. Body mass index is 24.41 kg/m.   Physical Exam: WD in NAD Neck  Supple NROM, without any  enlargements.  Lymph Node Survey No cervical supraclavicular or inguinal adenopathy Cardiovascular  Pulse normal rate, regularity and rhythm. S1 and S2 normal.  Lungs  Clear to auscultation bilaterally, without wheezes/crackles/rhonchi.  Distant breath sounds  Skin :no rash/lesions/breakdown  Psychiatry  Alert and oriented appropriate mood affect speech and reasoning. Abdomen  Normoactive bowel sounds, abdomen soft, non-tender. Surgical  sites intact without evidence of hernia.  Midline vertical incision from tubal ligation Back No CVA tenderness Genito Urinary  Vulva/vagina: Normal external female genitalia.  No lesions. No discharge or bleeding.  Bladder/urethra:  No lesions or masses  Vagina: Atrophic without masses  Cervix: Mottled in appearance   Uterus: Mobile proximately 10 cm, no parametrial involvement or nodularity.  Adnexa: No palpable masses or cul-de-sac nodularity. Rectal  Patient declined Extremities  No bilateral cyanosis, clubbing or edema.

## 2019-01-08 NOTE — H&P (View-Only) (Signed)
GYN ONCOLOGY OFFICE TREATMENT COUNSELLING   Erica [name] 68 y.o. female  CC: Grade three endometrial cancer  Assessment/Plan: [name] is a 68 y.o with grade 3 endometrial cancer and a history of 10 years of tamoxifen as adjuvant treatment for right-sided breast cancer.   A PET scan from 01/08/2019 is negative for  local or distant metastatic disease.  I extensively reviewed that the risks of robotic hysterectomy bilateral salpingo-oophorectomy, sentinel lymph node sampling of pelvic periaortic lymph nodes using icsocyanine green dye, possible lymph node dissection possible laparotomy include infection, bleeding, damage to nearby organs including bowel, bladder, vessels, nerves, and ureters. We discussed postoperative risks including infection, PE/ DVT, and lymphedema. We reviewed possible need for conversion to open laparotomy, need for possible blood transfusion. I discussed positioning during surgery of trendelenberg and risks of minor facial swelling and care we take in preoperative positioning. We also reviewed possible need for further treatment such as radiation or chemotherapy based on final pathology. Expected postoperative recovery was also discussed with the patient and her daughter who participated via [place].  Surgery scheduled for January 16, 2019 they are aware that Dr. Denman George will be the surgeon.  Also counseled that the standard is for discharge on the day of surgery however this will be based on postoperative recovery.   HPI: [name] is a 67 y.o.  G5 P3 with a history of right breast cancer who received adjuvant tamoxifen for 10 years, 2000 62,016.[name]  She reports vaginal spotting with pain that began on December 07, 2018.  A transvaginal ultrasound was notable for a uterus measuring 8.3 x 4.4 x 5.1 cm with an endometrial stripe of 1.53 cm.  Neither ovary was visualized.  An endometrial biopsy was obtained on December 26, 2018 and returned with a grade 3  endometrioid adenocarcinoma.    The cervix was noted to be unusual in appearance and a biopsy was collected and returned as a benign endocervical polyp.  Pet imaging from January 07, 2019 is negative for evidence of local or distant metastatic disease.  Review of Systems:  Constitutional  Feels well,   Cardiovascular  No chest pain, shortness of breath, or edema  Pulmonary  No cough or wheeze.  Gastro Intestinal  No nausea, vomitting, or diarrhoea. No bright red blood per rectum, no abdominal pain, change in bowel movement, or constipation.  Genito Urinary  No frequency, urgency, dysuria, urge incontinence occasional vaginal spotting musculo Skeletal  No myalgia, arthralgia, joint swelling or pain  Neurologic  No weakness, numbness, change in gait,  Psychology  No depression, anxiety, insomnia.    Current Meds:  Outpatient Encounter Medications as of 01/09/2019  Medication Sig  . acetaminophen (TYLENOL) 500 MG tablet Take 500 mg by mouth every 6 (six) hours as needed.  [name] alendronate (FOSAMAX) 70 MG tablet TK 1 T PO 1 TIME A WK  . Calcium Carb-Cholecalciferol (CALCIUM 600+D3) 600-200 MG-UNIT TABS Take 1 tablet by mouth daily.  [name] etodolac (LODINE) 400 MG tablet Take 400 mg by mouth.   . fluticasone (FLONASE) 50 MCG/ACT nasal spray Place 1 spray into both nostrils daily.  [name] loratadine (CLARITIN) 10 MG tablet Take 10 mg by mouth daily.  . Multiple Vitamin (MULTIVITAMIN) tablet Take 1 tablet by mouth daily.  [name] SYNTHROID 75 MCG tablet Take 75 mcg by mouth daily before breakfast.    No facility-administered encounter medications on file as of 01/09/2019.     Allergy:  Allergies  Allergen Reactions  .  Gadolinium      Desc: Pt developed nausea and vomiting after receiving 17cc Multihance. Came back for repeat study and did fine with Magnevist. Erica [name], Onset Date: [id number]     Social Hx:   Social History   Socioeconomic History  . Marital status: Married    Spouse name: Not  on file  . Number of children: Not on file  . Years of education: Not on file  . Highest education level: Not on file  Occupational History  . Not on file  Social Needs  . Financial resource strain: Not on file  . Food insecurity    Worry: Not on file    Inability: Not on file  . Transportation needs    Medical: Not on file    Non-medical: Not on file  Tobacco Use  . Smoking status: Never Smoker  . Smokeless tobacco: Never Used  Substance and Sexual Activity  . Alcohol use: Never    Frequency: Never  . Drug use: Never  . Sexual activity: Yes  Lifestyle  . Physical activity    Days per week: Not on file    Minutes per session: Not on file  . Stress: Not on file  Relationships  . Social Herbalist on phone: Not on file    Gets together: Not on file    Attends religious service: Not on file    Active member of club or organization: Not on file    Attends meetings of clubs or organizations: Not on file    Relationship status: Not on file  . Intimate partner violence    Fear of current or ex partner: Not on file    Emotionally abused: Not on file    Physically abused: Not on file    Forced sexual activity: Not on file  Other Topics Concern  . Not on file  Social History Narrative  . Not on file    Past Surgical Hx:  Past Surgical History:  Procedure Laterality Date  . BREAST LUMPECTOMY Right 2006   With Lymphnode discetion  . NM I-131 ABLATION W/THYROGEN ([place] HX)  1970  . OTHER SURGICAL HISTORY  1991   HNP with Fusion   . TUBAL LIGATION Bilateral 1975    Past Medical Hx:  Past Medical History:  Diagnosis Date  . Allergy   . Arthritis   . Breast cancer ([place])    right/2006  . Osteoarthritis   . Osteopenia   . Thyroid disease    Hypothyroidism  Patient is unaware of the stage of the breast cancer but states that she received lumpectomy sentinel lymph node dissection adjuvant radiation and chemotherapy followed by tamoxifen treatment for  approximately 10 years  Past Gynecological History: Gravida 5 para 3 menopause occurred in the 64s denies any history of abnormal Pap and denies use of any hormonal agents for birth control  family Hx:  Family History  Problem Relation Age of Onset  . Hypertension Mother   . Heart disease Mother   . Cirrhosis Father   . Colon cancer Neg Hx   . Liver disease Neg Hx   . Colon polyps Neg Hx     Vitals:  Blood pressure (!) 160/65, pulse 70, temperature 98.5 F (36.9 C), temperature source Oral, resp. rate 16, height 5' 2.5" (1.588 m), weight 135 lb 9.6 oz (61.5 kg), SpO2 100 %. Body mass index is 24.41 kg/m.   Physical Exam: WD in NAD Neck  Supple NROM, without any  enlargements.  Lymph Node Survey No cervical supraclavicular or inguinal adenopathy Cardiovascular  Pulse normal rate, regularity and rhythm. S1 and S2 normal.  Lungs  Clear to auscultation bilaterally, without wheezes/crackles/rhonchi.  Distant breath sounds  Skin :no rash/lesions/breakdown  Psychiatry  Alert and oriented appropriate mood affect speech and reasoning. Abdomen  Normoactive bowel sounds, abdomen soft, non-tender. Surgical  sites intact without evidence of hernia.  Midline vertical incision from tubal ligation Back No CVA tenderness Genito Urinary  Vulva/vagina: Normal external female genitalia.  No lesions. No discharge or bleeding.  Bladder/urethra:  No lesions or masses  Vagina: Atrophic without masses  Cervix: Mottled in appearance   Uterus: Mobile proximately 10 cm, no parametrial involvement or nodularity.  Adnexa: No palpable masses or cul-de-sac nodularity. Rectal  Patient declined Extremities  No bilateral cyanosis, clubbing or edema.

## 2019-01-09 ENCOUNTER — Inpatient Hospital Stay (HOSPITAL_BASED_OUTPATIENT_CLINIC_OR_DEPARTMENT_OTHER): Payer: Medicare Other | Admitting: Gynecologic Oncology

## 2019-01-09 ENCOUNTER — Other Ambulatory Visit: Payer: Self-pay

## 2019-01-09 VITALS — BP 160/65 | HR 70 | Temp 98.5°F | Resp 16 | Ht 62.5 in | Wt 135.6 lb

## 2019-01-09 DIAGNOSIS — C541 Malignant neoplasm of endometrium: Secondary | ICD-10-CM

## 2019-01-09 MED ORDER — SENNOSIDES-DOCUSATE SODIUM 8.6-50 MG PO TABS: 2.0000 | ORAL_TABLET | Freq: Every day | ORAL | 0 refills | Status: DC

## 2019-01-09 MED ORDER — TRAMADOL HCL 50 MG PO TABS: 50.0000 mg | ORAL_TABLET | Freq: Four times a day (QID) | ORAL | 0 refills | Status: DC | PRN

## 2019-01-09 NOTE — Progress Notes (Signed)
Done

## 2019-01-09 NOTE — Patient Instructions (Signed)
Preparing for your Surgery  Plan for surgery on January 16, 2019 with Dr. Everitt Amber at [facility] will be scheduled for a robotic assisted total laparoscopic hysterectomy, bilateral salpingo-oophorectomy, sentinel lymph node biopsy.   Pre-operative Testing -You will receive a phone call from presurgical testing at [facility] if you have not received a call already to arrange for a pre-operative testing appointment before your surgery.  This appointment normally occurs one to two weeks before your scheduled surgery.   -Bring your insurance card, copy of an advanced directive if applicable, medication list  -At that visit, you will be asked to sign a consent for a possible blood transfusion in case a transfusion becomes necessary during surgery.  The need for a blood transfusion is rare but having consent is a necessary part of your care.     -You should not be taking blood thinners or aspirin at least ten days prior to surgery unless instructed by your surgeon.  -Do not take supplements such as fish oil (omega 3), red yeast rice, tumeric before your surgery.  Day Before Surgery at [place] will be asked to take in a light diet the day before surgery.  Avoid carbonated beverages.  You will be advised to have nothing to eat or drink after midnight the evening before.    Eat a light diet the day before surgery.  Examples including soups, broths, toast, yogurt, mashed potatoes.  Things to avoid include carbonated beverages (fizzy beverages), raw fruits and raw vegetables, or beans.   If your bowels are filled with gas, your surgeon will have difficulty visualizing your pelvic organs which increases your surgical risks.  Your role in recovery Your role is to become active as soon as directed by your doctor, while still giving yourself time to heal.  Rest when you feel tired. You will be asked to do the following in order to speed your recovery:  - Cough and breathe  deeply. This helps toclear and expand your lungs and can prevent pneumonia. You may be given a spirometer to practice deep breathing. A staff member will show you how to use the spirometer. - Do mild physical activity. Walking or moving your legs help your circulation and body functions return to normal. A staff member will help you when you try to walk and will provide you with simple exercises. Do not try to get up or walk alone the first time. - Actively manage your pain. Managing your pain lets you move in comfort. We will ask you to rate your pain on a scale of zero to 10. It is your responsibility to tell your doctor or nurse where and how much you hurt so your pain can be treated.  Special Considerations -If you are diabetic, you may be placed on insulin after surgery to have closer control over your blood sugars to promote healing and recovery.  This does not mean that you will be discharged on insulin.  If applicable, your oral antidiabetics will be resumed when you are tolerating a solid diet.  -Your final pathology results from surgery should be available around one week after surgery and the results will be relayed to you when available.  -Dr. Lahoma Crocker is the surgeon that assists your GYN Oncologist with surgery.  If you end up staying the night, the next day after your surgery you will either see Dr. Denman George or Dr. Lahoma Crocker.  -FMLA forms can be faxed to [phone] and please allow  5-7 business days for completion.  Pain Management After Surgery -You have been prescribed your pain medication and bowel regimen medications before surgery so that you can have these available when you are discharged from the hospital. The pain medication is for use ONLY AFTER surgery and a new prescription will not be given.   -Make sure that you have Tylenol and Ibuprofen at home to use on a regular basis after surgery for pain control. We recommend alternating the medications every hour  to six hours since they work differently and are processed in the body differently for pain relief.  -Review the attached handout on narcotic use and their risks and side effects.   Bowel Regimen -You have been prescribed Sennakot-S to take nightly to prevent constipation especially if you are taking the narcotic pain medication intermittently.  It is important to prevent constipation and drink adequate amounts of liquids.  Blood Transfusion Information WHAT IS A BLOOD TRANSFUSION? A transfusion is the replacement of blood or some of its parts. Blood is made up of multiple cells which provide different functions.  Red blood cells carry oxygen and are used for blood loss replacement.  White blood cells fight against infection.  Platelets control bleeding.  Plasma helps clot blood.  Other blood products are available for specialized needs, such as hemophilia or other clotting disorders. BEFORE THE TRANSFUSION  Who gives blood for transfusions?   You may be able to donate blood to be used at a later date on yourself (autologous donation).  Relatives can be asked to donate blood. This is generally not any safer than if you have received blood from a stranger. The same precautions are taken to ensure safety when a relative's blood is donated.  Healthy volunteers who are fully evaluated to make sure their blood is safe. This is blood bank blood. Transfusion therapy is the safest it has ever been in the practice of medicine. Before blood is taken from a donor, a complete history is taken to make sure that person has no history of diseases nor engages in risky social behavior (examples are intravenous drug use or sexual activity with multiple partners). The donor's travel history is screened to minimize risk of transmitting infections, such as malaria. The donated blood is tested for signs of infectious diseases, such as HIV and hepatitis. The blood is then tested to be sure it is compatible with  you in order to minimize the chance of a transfusion reaction. If you or a relative donates blood, this is often done in anticipation of surgery and is not appropriate for emergency situations. It takes many days to process the donated blood. RISKS AND COMPLICATIONS Although transfusion therapy is very safe and saves many lives, the main dangers of transfusion include:   Getting an infectious disease.  Developing a transfusion reaction. This is an allergic reaction to something in the blood you were given. Every precaution is taken to prevent this. The decision to have a blood transfusion has been considered carefully by your caregiver before blood is given. Blood is not given unless the benefits outweigh the risks.  AFTER SURGERY INSTRUCTIONS  01/09/2019  Return to work: 4-6 weeks if applicable  Activity: 1. Be up and out of the bed during the day.  Take a nap if needed.  You may walk up steps but be careful and use the hand rail.  Stair climbing will tire you more than you think, you may need to stop part way and rest.  2. No lifting or straining for 6 weeks.  3. No driving for 1 week(s).  Do not drive if you are taking narcotic pain medicine.  4. Shower daily.  Use soap and water on your incision and pat dry; don't rub.  No tub baths until cleared by your surgeon.   5. No sexual activity and nothing in the vagina for 8 weeks.  6. You may experience a small amount of clear drainage from your incisions, which is normal.  If the drainage persists or increases, please call the office.  7. You may experience vaginal spotting after surgery or around the 6-8 week mark from surgery when the stitches at the top of the vagina begin to dissolve.  The spotting is normal but if you experience heavy bleeding, call our office.  8. Take Tylenol or ibuprofen first for pain and only use narcotic pain meds for severe pain not relieved by the Tylenol or Ibuprofen.  Monitor your Tylenol intake to a max  of 4,000 mg a day.  Diet: 1. Low sodium Heart Healthy Diet is recommended.  2. It is safe to use a laxative, such as Miralax or Colace, if you have difficulty moving your bowels. You can take Sennakot at bedtime every evening to keep bowel movements regular and to prevent constipation.    Wound Care: 1. Keep clean and dry.  Shower daily.  Reasons to call the Doctor:  Fever - Oral temperature greater than 100.4 degrees Fahrenheit  Foul-smelling vaginal discharge  Difficulty urinating  Nausea and vomiting  Increased pain at the site of the incision that is unrelieved with pain medicine.  Difficulty breathing with or without chest pain  New calf pain especially if only on one side  Sudden, continuing increased vaginal bleeding with or without clots.   Contacts: For questions or concerns you should contact:  Dr. Everitt Amber at [phone]  Joylene John, NP at [phone]  After Hours: call [phone] and have the GYN Oncologist paged/contacted

## 2019-01-10 ENCOUNTER — Other Ambulatory Visit: Payer: Self-pay | Admitting: Gynecologic Oncology

## 2019-01-10 DIAGNOSIS — C541 Malignant neoplasm of endometrium: Secondary | ICD-10-CM

## 2019-01-10 NOTE — Progress Notes (Signed)
01/08/2019- noted in [place] PET image

## 2019-01-10 NOTE — Patient Instructions (Addendum)
YOU NEED TO HAVE A COVID 19 TEST ON 01-13-19 @ 2:35 PM, THIS TEST MUST BE DONE BEFORE SURGERY, COME  [address], [place] , [id number]. ONCE YOUR COVID TEST IS COMPLETED, PLEASE BEGIN THE QUARANTINE INSTRUCTIONS AS OUTLINED IN YOUR HANDOUT.                Erica [name]  01/10/2019   Your procedure is scheduled on: Thursday 01/16/2019   Report to [facility] Main  Entrance              Report to Short Stay at   05:30 AM               1 VISITOR IS ALLOWED TO WAIT IN WAITING ROOM  ONLY DAY OF YOUR SURGERY. NO VISITORS ARE ALLOWED IN SHORT STAY OR RECOVERY ROOM.   Call this number if you have problems the morning of surgery [phone]    Remember: Eat a light diet the day before surgery.  Examples including soups, broths, toast, yogurt, mashed potatoes.              Things to avoid include carbonated beverages (fizzy beverages), raw fruits and raw vegetables, or beans.               If your bowels are filled with gas, your surgeon will have difficulty visualizing your pelvic organs which increases your surgical risks.              May have clear liquids from midnight ,the night before surgery up until 0430 am then nothing until after surgery!   CLEAR LIQUID DIET   Foods Allowed                                                                     Foods Excluded  Coffee and tea, regular and decaf                             liquids that you cannot  Plain Jell-O any favor except red or purple                                           see through such as: Fruit ices (not with fruit pulp)                                     milk, soups, orange juice  Iced Popsicles                                    All solid food Carbonated beverages, regular and diet                                    Cranberry, grape and apple juices Sports drinks like Gatorade Lightly seasoned clear broth or consume(fat free) Sugar, honey  syrup  _____________________________________________________________________  BRUSH YOUR TEETH MORNING OF SURGERY AND RINSE YOUR MOUTH OUT, NO CHEWING GUM CANDY OR MINTS.     Take these medicines the morning of surgery with A SIP OF WATER: Claritin, Synthroid                                You may not have any metal on your body including hair pins and              piercings  Do not wear jewelry, make-up, lotions, powders or perfumes, deodorant             Do not wear nail polish.  Do not shave  48 hours prior to surgery.               Do not bring valuables to the hospital. [name].  Contacts, dentures or bridgework may not be worn into surgery.     Patients discharged the day of surgery will not be allowed to drive home. IF YOU ARE HAVING SURGERY AND GOING HOME THE SAME DAY, YOU MUST HAVE AN ADULT TO DRIVE YOU HOME AND BE WITH YOU FOR 24 HOURS. YOU MAY GO HOME BY TAXI OR UBER OR ORTHERWISE, BUT AN ADULT MUST ACCOMPANY YOU HOME AND STAY WITH YOU FOR 24 HOURS.  Name and phone number of your driver: Taraja Shanor [phone]               Please read over the following fact sheets you were given: _____________________________________________________________________             [facility] - Preparing for Surgery Before surgery, you can play an important role.  Because skin is not sterile, your skin needs to be as free of germs as possible.  You can reduce the number of germs on your skin by washing with CHG (chlorahexidine gluconate) soap before surgery.  CHG is an antiseptic cleaner which kills germs and bonds with the skin to continue killing germs even after washing. Please DO NOT use if you have an allergy to CHG or antibacterial soaps.  If your skin becomes reddened/irritated stop using the CHG and inform your nurse when you arrive at Short Stay. Do not shave (including legs and underarms) for at least 48 hours  prior to the first CHG shower.  You may shave your face/neck. Please follow these instructions carefully:  1.  Shower with CHG Soap the night before surgery and the  morning of Surgery.  2.  If you choose to wash your hair, wash your hair first as usual with your  normal  shampoo.  3.  After you shampoo, rinse your hair and body thoroughly to remove the  shampoo.                           4.  Use CHG as you would any other liquid soap.  You can apply chg directly  to the skin and wash                       Gently with a scrungie or clean washcloth.  5.  Apply the CHG Soap to your body ONLY FROM THE NECK DOWN.   Do not use on face/ open  Wound or open sores. Avoid contact with eyes, ears mouth and genitals (private parts).                       Wash face,  Genitals (private parts) with your normal soap.             6.  Wash thoroughly, paying special attention to the area where your surgery  will be performed.  7.  Thoroughly rinse your body with warm water from the neck down.  8.  DO NOT shower/wash with your normal soap after using and rinsing off  the CHG Soap.                9.  Pat yourself dry with a clean towel.            10.  Wear clean pajamas.            11.  Place clean sheets on your bed the night of your first shower and do not  sleep with pets. Day of Surgery : Do not apply any lotions/deodorants the morning of surgery.  Please wear clean clothes to the hospital/surgery center.  FAILURE TO FOLLOW THESE INSTRUCTIONS MAY RESULT IN THE CANCELLATION OF YOUR SURGERY PATIENT SIGNATURE_________________________________  NURSE SIGNATURE__________________________________  ________________________________________________________________________   Adam Phenix  An incentive spirometer is a tool that can help keep your lungs clear and active. This tool measures how well you are filling your lungs with each breath. Taking long deep breaths may help reverse  or decrease the chance of developing breathing (pulmonary) problems (especially infection) following:  A long period of time when you are unable to move or be active. BEFORE THE PROCEDURE   If the spirometer includes an indicator to show your best effort, your nurse or respiratory therapist will set it to a desired goal.  If possible, sit up straight or lean slightly forward. Try not to slouch.  Hold the incentive spirometer in an upright position. INSTRUCTIONS FOR USE  1. Sit on the edge of your bed if possible, or sit up as far as you can in bed or on a chair. 2. Hold the incentive spirometer in an upright position. 3. Breathe out normally. 4. Place the mouthpiece in your mouth and seal your lips tightly around it. 5. Breathe in slowly and as deeply as possible, raising the piston or the ball toward the top of the column. 6. Hold your breath for 3-5 seconds or for as long as possible. Allow the piston or ball to fall to the bottom of the column. 7. Remove the mouthpiece from your mouth and breathe out normally. 8. Rest for a few seconds and repeat Steps 1 through 7 at least 10 times every 1-2 hours when you are awake. Take your time and take a few normal breaths between deep breaths. 9. The spirometer may include an indicator to show your best effort. Use the indicator as a goal to work toward during each repetition. 10. After each set of 10 deep breaths, practice coughing to be sure your lungs are clear. If you have an incision (the cut made at the time of surgery), support your incision when coughing by placing a pillow or rolled up towels firmly against it. Once you are able to get out of bed, walk around indoors and cough well. You may stop using the incentive spirometer when instructed by your caregiver.  RISKS AND COMPLICATIONS  Take your time so you do not get  dizzy or light-headed.  If you are in pain, you may need to take or ask for pain medication before doing incentive  spirometry. It is harder to take a deep breath if you are having pain. AFTER USE  Rest and breathe slowly and easily.  It can be helpful to keep track of a log of your progress. Your caregiver can provide you with a simple table to help with this. If you are using the spirometer at home, follow these instructions: [place] IF:   You are having difficultly using the spirometer.  You have trouble using the spirometer as often as instructed.  Your pain medication is not giving enough relief while using the spirometer.  You develop fever of 100.5 F (38.1 C) or higher. SEEK IMMEDIATE MEDICAL CARE IF:   You cough up bloody sputum that had not been present before.  You develop fever of 102 F (38.9 C) or greater.  You develop worsening pain at or near the incision site. MAKE SURE YOU:   Understand these instructions.  Will watch your condition.  Will get help right away if you are not doing well or get worse. Document Released: 09/18/2006 Document Revised: 07/31/2011 Document Reviewed: 11/19/2006 Exit[employer] Patient Information 2014 Exit[employer], [place].   ________________________________________________________________________  WHAT IS A BLOOD TRANSFUSION? Blood Transfusion Information  A transfusion is the replacement of blood or some of its parts. Blood is made up of multiple cells which provide different functions.  Red blood cells carry oxygen and are used for blood loss replacement.  White blood cells fight against infection.  Platelets control bleeding.  Plasma helps clot blood.  Other blood products are available for specialized needs, such as hemophilia or other clotting disorders. BEFORE THE TRANSFUSION  Who gives blood for transfusions?   Healthy volunteers who are fully evaluated to make sure their blood is safe. This is blood bank blood. Transfusion therapy is the safest it has ever been in the practice of medicine. Before blood is taken from a donor, a  complete history is taken to make sure that person has no history of diseases nor engages in risky social behavior (examples are intravenous drug use or sexual activity with multiple partners). The donor's travel history is screened to minimize risk of transmitting infections, such as malaria. The donated blood is tested for signs of infectious diseases, such as HIV and hepatitis. The blood is then tested to be sure it is compatible with you in order to minimize the chance of a transfusion reaction. If you or a relative donates blood, this is often done in anticipation of surgery and is not appropriate for emergency situations. It takes many days to process the donated blood. RISKS AND COMPLICATIONS Although transfusion therapy is very safe and saves many lives, the main dangers of transfusion include:   Getting an infectious disease.  Developing a transfusion reaction. This is an allergic reaction to something in the blood you were given. Every precaution is taken to prevent this. The decision to have a blood transfusion has been considered carefully by your caregiver before blood is given. Blood is not given unless the benefits outweigh the risks. AFTER THE TRANSFUSION  Right after receiving a blood transfusion, you will usually feel much better and more energetic. This is especially true if your red blood cells have gotten low (anemic). The transfusion raises the level of the red blood cells which carry oxygen, and this usually causes an energy increase.  The nurse administering the transfusion will  monitor you carefully for complications. HOME CARE INSTRUCTIONS  No special instructions are needed after a transfusion. You may find your energy is better. Speak with your caregiver about any limitations on activity for underlying diseases you may have. SEEK MEDICAL CARE IF:   Your condition is not improving after your transfusion.  You develop redness or irritation at the intravenous (IV)  site. SEEK IMMEDIATE MEDICAL CARE IF:  Any of the following symptoms occur over the next 12 hours:  Shaking chills.  You have a temperature by mouth above 102 F (38.9 C), not controlled by medicine.  Chest, back, or muscle pain.  People around you feel you are not acting correctly or are confused.  Shortness of breath or difficulty breathing.  Dizziness and fainting.  You get a rash or develop hives.  You have a decrease in urine output.  Your urine turns a dark color or changes to pink, red, or brown. Any of the following symptoms occur over the next 10 days:  You have a temperature by mouth above 102 F (38.9 C), not controlled by medicine.  Shortness of breath.  Weakness after normal activity.  The white part of the eye turns yellow (jaundice).  You have a decrease in the amount of urine or are urinating less often.  Your urine turns a dark color or changes to pink, red, or brown. Document Released: 05/05/2000 Document Revised: 07/31/2011 Document Reviewed: 12/23/2007 [facility] Patient Information 2014 [place], [place].  _______________________________________________________________________

## 2019-01-13 ENCOUNTER — Encounter (HOSPITAL_COMMUNITY): Payer: Self-pay

## 2019-01-13 ENCOUNTER — Other Ambulatory Visit (HOSPITAL_COMMUNITY)
Admission: RE | Admit: 2019-01-13 | Discharge: 2019-01-13 | Disposition: A | Discharge status: Home / Self Care | Payer: Medicare Other | Source: Ambulatory Visit | Attending: Gynecologic Oncology | Admitting: Gynecologic Oncology

## 2019-01-13 ENCOUNTER — Other Ambulatory Visit: Payer: Self-pay

## 2019-01-13 ENCOUNTER — Encounter (HOSPITAL_COMMUNITY)
Admission: RE | Admit: 2019-01-13 | Discharge: 2019-01-13 | Disposition: A | Discharge status: Home / Self Care | Payer: Medicare Other | Source: Ambulatory Visit | Attending: Gynecologic Oncology | Admitting: Gynecologic Oncology

## 2019-01-13 DIAGNOSIS — M8580 Other specified disorders of bone density and structure, unspecified site: Secondary | ICD-10-CM | POA: Diagnosis not present

## 2019-01-13 DIAGNOSIS — Z20828 Contact with and (suspected) exposure to other viral communicable diseases: Secondary | ICD-10-CM | POA: Insufficient documentation

## 2019-01-13 DIAGNOSIS — Z888 Allergy status to other drugs, medicaments and biological substances status: Secondary | ICD-10-CM | POA: Diagnosis not present

## 2019-01-13 DIAGNOSIS — Z8379 Family history of other diseases of the digestive system: Secondary | ICD-10-CM | POA: Diagnosis not present

## 2019-01-13 DIAGNOSIS — D649 Anemia, unspecified: Secondary | ICD-10-CM | POA: Diagnosis not present

## 2019-01-13 DIAGNOSIS — E039 Hypothyroidism, unspecified: Secondary | ICD-10-CM | POA: Diagnosis not present

## 2019-01-13 DIAGNOSIS — N838 Other noninflammatory disorders of ovary, fallopian tube and broad ligament: Secondary | ICD-10-CM | POA: Diagnosis not present

## 2019-01-13 DIAGNOSIS — Z8249 Family history of ischemic heart disease and other diseases of the circulatory system: Secondary | ICD-10-CM | POA: Diagnosis not present

## 2019-01-13 DIAGNOSIS — C541 Malignant neoplasm of endometrium: Secondary | ICD-10-CM | POA: Diagnosis not present

## 2019-01-13 DIAGNOSIS — Z79899 Other long term (current) drug therapy: Secondary | ICD-10-CM | POA: Diagnosis not present

## 2019-01-13 DIAGNOSIS — M1990 Unspecified osteoarthritis, unspecified site: Secondary | ICD-10-CM | POA: Diagnosis not present

## 2019-01-13 DIAGNOSIS — Z853 Personal history of malignant neoplasm of breast: Secondary | ICD-10-CM | POA: Diagnosis not present

## 2019-01-13 DIAGNOSIS — Z01812 Encounter for preprocedural laboratory examination: Secondary | ICD-10-CM | POA: Insufficient documentation

## 2019-01-13 LAB — CBC
HCT: 34 % — ABNORMAL LOW (ref 36.0–46.0)
Hemoglobin: 10.8 g/dL — ABNORMAL LOW (ref 12.0–15.0)
MCH: 33.4 pg (ref 26.0–34.0)
MCHC: 31.8 g/dL (ref 30.0–36.0)
MCV: 105.3 fL — ABNORMAL HIGH (ref 80.0–100.0)
Platelets: 204 10*3/uL (ref 150–400)
RBC: 3.23 MIL/uL — ABNORMAL LOW (ref 3.87–5.11)
RDW: 11.9 % (ref 11.5–15.5)
WBC: 5.3 10*3/uL (ref 4.0–10.5)
nRBC: 0 % (ref 0.0–0.2)

## 2019-01-13 LAB — COMPREHENSIVE METABOLIC PANEL
ALT: 12 U/L (ref 0–44)
AST: 20 U/L (ref 15–41)
Albumin: 3.8 g/dL (ref 3.5–5.0)
Alkaline Phosphatase: 55 U/L (ref 38–126)
Anion gap: 6 (ref 5–15)
BUN: 7 mg/dL — ABNORMAL LOW (ref 8–23)
CO2: 30 mmol/L (ref 22–32)
Calcium: 9.4 mg/dL (ref 8.9–10.3)
Chloride: 105 mmol/L (ref 98–111)
Creatinine, Ser: 0.67 mg/dL (ref 0.44–1.00)
GFR calc Af Amer: >60 mL/min (ref >60)
GFR calc non Af Amer: >60 mL/min (ref >60)
Glucose, Bld: 98 mg/dL (ref 70–99)
Potassium: 3.8 mmol/L (ref 3.5–5.1)
Sodium: 141 mmol/L (ref 135–145)
Total Bilirubin: 0.6 mg/dL (ref 0.3–1.2)
Total Protein: 7.5 g/dL (ref 6.5–8.1)

## 2019-01-13 LAB — URINALYSIS, ROUTINE W REFLEX MICROSCOPIC
Bacteria, UA: NONE SEEN
Bilirubin Urine: NEGATIVE
Glucose, UA: NEGATIVE mg/dL
Ketones, ur: NEGATIVE mg/dL
Nitrite: NEGATIVE
Protein, ur: NEGATIVE mg/dL
Specific Gravity, Urine: 1.004 — ABNORMAL LOW (ref 1.005–1.030)
pH: 6 (ref 5.0–8.0)

## 2019-01-13 LAB — ABO/RH: ABO/RH(D): A POS

## 2019-01-13 NOTE — Progress Notes (Signed)
01-13-19 UA and CMP results routed to Dr. Denman George for review

## 2019-01-13 NOTE — Progress Notes (Signed)
PCP - [name] Cardiologist -   Chest x-ray -  EKG -  Stress Test -  ECHO -  Cardiac Cath -   Sleep Study -  CPAP -   Fasting Blood Sugar -  Checks Blood Sugar _____ times a day  Blood Thinner Instructions: Aspirin Instructions:  Anesthesia review:   Patient denies shortness of breath, fever, cough and chest pain at PAT appointment   Patient verbalized understanding of instructions that were given to them at the PAT appointment. Patient was also instructed that they will need to review over the PAT instructions again at home before surgery.

## 2019-01-14 LAB — SARS CORONAVIRUS 2 (TAT 6-24 HRS): SARS Coronavirus 2: NEGATIVE

## 2019-01-16 ENCOUNTER — Ambulatory Visit (HOSPITAL_COMMUNITY): Payer: Medicare Other | Admitting: Physician Assistant

## 2019-01-16 ENCOUNTER — Encounter (HOSPITAL_COMMUNITY): Payer: Self-pay | Admitting: *Deleted

## 2019-01-16 ENCOUNTER — Ambulatory Visit (HOSPITAL_COMMUNITY): Payer: Medicare Other | Admitting: Certified Registered"

## 2019-01-16 ENCOUNTER — Ambulatory Visit (HOSPITAL_COMMUNITY)
Admission: RE | Admit: 2019-01-16 | Discharge: 2019-01-16 | Disposition: A | Discharge status: Home / Self Care | Payer: Medicare Other | Attending: Gynecologic Oncology | Admitting: Gynecologic Oncology

## 2019-01-16 ENCOUNTER — Encounter (HOSPITAL_COMMUNITY)
Admission: RE | Disposition: A | Discharge status: Home / Self Care | Payer: Self-pay | Source: Home / Self Care | Attending: Gynecologic Oncology

## 2019-01-16 DIAGNOSIS — Z853 Personal history of malignant neoplasm of breast: Secondary | ICD-10-CM | POA: Insufficient documentation

## 2019-01-16 DIAGNOSIS — Z8379 Family history of other diseases of the digestive system: Secondary | ICD-10-CM | POA: Insufficient documentation

## 2019-01-16 DIAGNOSIS — E039 Hypothyroidism, unspecified: Secondary | ICD-10-CM | POA: Insufficient documentation

## 2019-01-16 DIAGNOSIS — M8580 Other specified disorders of bone density and structure, unspecified site: Secondary | ICD-10-CM | POA: Insufficient documentation

## 2019-01-16 DIAGNOSIS — N838 Other noninflammatory disorders of ovary, fallopian tube and broad ligament: Secondary | ICD-10-CM | POA: Insufficient documentation

## 2019-01-16 DIAGNOSIS — D649 Anemia, unspecified: Secondary | ICD-10-CM | POA: Insufficient documentation

## 2019-01-16 DIAGNOSIS — M1990 Unspecified osteoarthritis, unspecified site: Secondary | ICD-10-CM | POA: Insufficient documentation

## 2019-01-16 DIAGNOSIS — C541 Malignant neoplasm of endometrium: Secondary | ICD-10-CM | POA: Diagnosis not present

## 2019-01-16 DIAGNOSIS — Z20828 Contact with and (suspected) exposure to other viral communicable diseases: Secondary | ICD-10-CM | POA: Diagnosis not present

## 2019-01-16 DIAGNOSIS — Z888 Allergy status to other drugs, medicaments and biological substances status: Secondary | ICD-10-CM | POA: Insufficient documentation

## 2019-01-16 DIAGNOSIS — Z8249 Family history of ischemic heart disease and other diseases of the circulatory system: Secondary | ICD-10-CM | POA: Insufficient documentation

## 2019-01-16 DIAGNOSIS — Z79899 Other long term (current) drug therapy: Secondary | ICD-10-CM | POA: Insufficient documentation

## 2019-01-16 HISTORY — PX: SENTINEL NODE BIOPSY: SHX6608

## 2019-01-16 HISTORY — PX: ROBOTIC ASSISTED TOTAL HYSTERECTOMY WITH BILATERAL SALPINGO OOPHERECTOMY: SHX6086

## 2019-01-16 LAB — TYPE AND SCREEN
ABO/RH(D): A POS
Antibody Screen: NEGATIVE

## 2019-01-16 SURGERY — HYSTERECTOMY, TOTAL, ROBOT-ASSISTED, LAPAROSCOPIC, WITH BILATERAL SALPINGO-OOPHORECTOMY
Anesthesia: General

## 2019-01-16 MED ORDER — HYDROMORPHONE HCL 1 MG/ML IJ SOLN
INTRAMUSCULAR | Status: AC
  Filled 2019-01-16: qty 1

## 2019-01-16 MED ORDER — PROPOFOL 10 MG/ML IV BOLUS
INTRAVENOUS | Status: AC
  Filled 2019-01-16: qty 20

## 2019-01-16 MED ORDER — LIDOCAINE 2% (20 MG/ML) 5 ML SYRINGE
INTRAMUSCULAR | Status: AC
  Filled 2019-01-16: qty 5

## 2019-01-16 MED ORDER — FENTANYL CITRATE (PF) 100 MCG/2ML IJ SOLN
INTRAMUSCULAR | Status: DC | PRN
  Administered 2019-01-16: 100 ug via INTRAVENOUS
  Administered 2019-01-16: 50 ug via INTRAVENOUS
  Administered 2019-01-16: 100 ug via INTRAVENOUS

## 2019-01-16 MED ORDER — LIDOCAINE 2% (20 MG/ML) 5 ML SYRINGE
INTRAMUSCULAR | Status: DC | PRN
  Administered 2019-01-16: 80 mg via INTRAVENOUS

## 2019-01-16 MED ORDER — ENOXAPARIN SODIUM 40 MG/0.4ML ~~LOC~~ SOLN
40.0000 mg | SUBCUTANEOUS | Status: AC
  Administered 2019-01-16: 06:00:00 40 mg via SUBCUTANEOUS
  Filled 2019-01-16: qty 0.4

## 2019-01-16 MED ORDER — KETOROLAC TROMETHAMINE 15 MG/ML IJ SOLN
INTRAMUSCULAR | Status: AC
  Filled 2019-01-16: qty 1

## 2019-01-16 MED ORDER — SODIUM CHLORIDE 0.9% FLUSH: 3.0000 mL | Freq: Two times a day (BID) | INTRAVENOUS | Status: DC

## 2019-01-16 MED ORDER — ACETAMINOPHEN 325 MG PO TABS: 650.0000 mg | ORAL_TABLET | ORAL | Status: DC | PRN

## 2019-01-16 MED ORDER — KETOROLAC TROMETHAMINE 15 MG/ML IJ SOLN
15.0000 mg | Freq: Once | INTRAMUSCULAR | Status: AC
  Administered 2019-01-16: 10:00:00 15 mg via INTRAVENOUS

## 2019-01-16 MED ORDER — ACETAMINOPHEN 500 MG PO TABS
1000.0000 mg | ORAL_TABLET | ORAL | Status: AC
  Administered 2019-01-16: 06:00:00 1000 mg via ORAL
  Filled 2019-01-16: qty 2

## 2019-01-16 MED ORDER — PROMETHAZINE HCL 25 MG/ML IJ SOLN: 6.2500 mg | INTRAMUSCULAR | Status: DC | PRN

## 2019-01-16 MED ORDER — EPHEDRINE 5 MG/ML INJ
INTRAVENOUS | Status: AC
  Filled 2019-01-16: qty 10

## 2019-01-16 MED ORDER — ACETAMINOPHEN 650 MG RE SUPP: 650.0000 mg | RECTAL | Status: DC | PRN

## 2019-01-16 MED ORDER — HYDROMORPHONE HCL 1 MG/ML IJ SOLN: 0.2500 mg | INTRAMUSCULAR | Status: DC | PRN

## 2019-01-16 MED ORDER — SUGAMMADEX SODIUM 200 MG/2ML IV SOLN
INTRAVENOUS | Status: DC | PRN
  Administered 2019-01-16: 200 mg via INTRAVENOUS

## 2019-01-16 MED ORDER — OXYCODONE HCL 5 MG PO TABS: 5.0000 mg | ORAL_TABLET | ORAL | Status: DC | PRN

## 2019-01-16 MED ORDER — STERILE WATER FOR INJECTION IJ SOLN
INTRAMUSCULAR | Status: DC | PRN
  Administered 2019-01-16: 08:00:00 4 mL

## 2019-01-16 MED ORDER — ROCURONIUM BROMIDE 10 MG/ML (PF) SYRINGE
PREFILLED_SYRINGE | INTRAVENOUS | Status: DC | PRN
  Administered 2019-01-16: 10 mg via INTRAVENOUS
  Administered 2019-01-16: 50 mg via INTRAVENOUS

## 2019-01-16 MED ORDER — GABAPENTIN 300 MG PO CAPS
300.0000 mg | ORAL_CAPSULE | ORAL | Status: AC
  Administered 2019-01-16: 300 mg via ORAL
  Filled 2019-01-16: qty 1

## 2019-01-16 MED ORDER — DEXAMETHASONE SODIUM PHOSPHATE 4 MG/ML IJ SOLN
4.0000 mg | INTRAMUSCULAR | Status: AC
  Administered 2019-01-16: 8 mg via INTRAVENOUS

## 2019-01-16 MED ORDER — INDOCYANINE GREEN 25 MG IV SOLR
INTRAVENOUS | Status: DC | PRN
  Administered 2019-01-16: 2.5 mg

## 2019-01-16 MED ORDER — KETAMINE HCL 10 MG/ML IJ SOLN
INTRAMUSCULAR | Status: DC | PRN
  Administered 2019-01-16: 15 mg via INTRAVENOUS
  Administered 2019-01-16: 10 mg via INTRAVENOUS

## 2019-01-16 MED ORDER — ONDANSETRON HCL 4 MG/2ML IJ SOLN
INTRAMUSCULAR | Status: AC
  Filled 2019-01-16: qty 2

## 2019-01-16 MED ORDER — LACTATED RINGERS IR SOLN
Status: DC | PRN
  Administered 2019-01-16: 1000 mL

## 2019-01-16 MED ORDER — SODIUM CHLORIDE 0.9% FLUSH: 3.0000 mL | INTRAVENOUS | Status: DC | PRN

## 2019-01-16 MED ORDER — BUPIVACAINE HCL 0.25 % IJ SOLN
INTRAMUSCULAR | Status: DC | PRN
  Administered 2019-01-16: 20 mL

## 2019-01-16 MED ORDER — STERILE WATER FOR INJECTION IJ SOLN
INTRAMUSCULAR | Status: AC
  Filled 2019-01-16: qty 10

## 2019-01-16 MED ORDER — MIDAZOLAM HCL 5 MG/5ML IJ SOLN
INTRAMUSCULAR | Status: DC | PRN
  Administered 2019-01-16: 2 mg via INTRAVENOUS

## 2019-01-16 MED ORDER — PROPOFOL 10 MG/ML IV BOLUS
INTRAVENOUS | Status: DC | PRN
  Administered 2019-01-16: 140 mg via INTRAVENOUS

## 2019-01-16 MED ORDER — MORPHINE SULFATE (PF) 4 MG/ML IV SOLN: 2.0000 mg | INTRAVENOUS | Status: DC | PRN

## 2019-01-16 MED ORDER — SCOPOLAMINE 1 MG/3DAYS TD PT72
1.0000 | MEDICATED_PATCH | TRANSDERMAL | Status: DC
  Administered 2019-01-16: 06:00:00 1.5 mg via TRANSDERMAL
  Filled 2019-01-16: qty 1

## 2019-01-16 MED ORDER — KETAMINE HCL 10 MG/ML IJ SOLN
INTRAMUSCULAR | Status: AC
  Filled 2019-01-16: qty 1

## 2019-01-16 MED ORDER — FENTANYL CITRATE (PF) 250 MCG/5ML IJ SOLN
INTRAMUSCULAR | Status: AC
  Filled 2019-01-16: qty 5

## 2019-01-16 MED ORDER — ROCURONIUM BROMIDE 10 MG/ML (PF) SYRINGE
PREFILLED_SYRINGE | INTRAVENOUS | Status: AC
  Filled 2019-01-16: qty 10

## 2019-01-16 MED ORDER — BUPIVACAINE HCL (PF) 0.25 % IJ SOLN
INTRAMUSCULAR | Status: AC
  Filled 2019-01-16: qty 30

## 2019-01-16 MED ORDER — LIDOCAINE 20MG/ML (2%) 15 ML SYRINGE OPTIME
INTRAMUSCULAR | Status: DC | PRN
  Administered 2019-01-16: 1.5 mg/kg/h via INTRAVENOUS

## 2019-01-16 MED ORDER — EPHEDRINE SULFATE-NACL 50-0.9 MG/10ML-% IV SOSY
PREFILLED_SYRINGE | INTRAVENOUS | Status: DC | PRN
  Administered 2019-01-16: 10 mg via INTRAVENOUS
  Administered 2019-01-16: 5 mg via INTRAVENOUS

## 2019-01-16 MED ORDER — LACTATED RINGERS IV SOLN
INTRAVENOUS | Status: DC
  Administered 2019-01-16: 06:00:00 via INTRAVENOUS

## 2019-01-16 MED ORDER — SODIUM CHLORIDE 0.9 % IV SOLN: 250.0000 mL | INTRAVENOUS | Status: DC | PRN

## 2019-01-16 MED ORDER — MEPERIDINE HCL 50 MG/ML IJ SOLN: 6.2500 mg | INTRAMUSCULAR | Status: DC | PRN

## 2019-01-16 MED ORDER — DEXAMETHASONE SODIUM PHOSPHATE 10 MG/ML IJ SOLN
INTRAMUSCULAR | Status: AC
  Filled 2019-01-16: qty 1

## 2019-01-16 MED ORDER — MIDAZOLAM HCL 2 MG/2ML IJ SOLN
INTRAMUSCULAR | Status: AC
  Filled 2019-01-16: qty 2

## 2019-01-16 MED ORDER — CEFAZOLIN SODIUM-DEXTROSE 2-4 GM/100ML-% IV SOLN
2.0000 g | INTRAVENOUS | Status: AC
  Administered 2019-01-16: 2 g via INTRAVENOUS
  Filled 2019-01-16: qty 100

## 2019-01-16 MED ORDER — ONDANSETRON HCL 4 MG/2ML IJ SOLN
INTRAMUSCULAR | Status: DC | PRN
  Administered 2019-01-16: 4 mg via INTRAVENOUS

## 2019-01-16 SURGICAL SUPPLY — 56 items
ADH SKN CLS APL DERMABOND .7 (GAUZE/BANDAGES/DRESSINGS) ×2
AGENT HMST KT MTR STRL THRMB (HEMOSTASIS)
APL ESCP 34 STRL LF DISP (HEMOSTASIS)
APPLICATOR SURGIFLO ENDO (HEMOSTASIS) IMPLANT
BAG LAPAROSCOPIC 12 15 PORT 16 (BASKET) IMPLANT
BAG RETRIEVAL 12/15 (BASKET)
BAG SPEC RTRVL LRG 6X4 10 (ENDOMECHANICALS)
COVER BACK TABLE 60X90IN (DRAPES) ×3 IMPLANT
COVER TIP SHEARS 8 DVNC (MISCELLANEOUS) ×2 IMPLANT
COVER TIP SHEARS 8MM DA VINCI (MISCELLANEOUS) ×1
COVER WAND RF STERILE (DRAPES) IMPLANT
DECANTER SPIKE VIAL GLASS SM (MISCELLANEOUS) ×2 IMPLANT
DERMABOND ADVANCED (GAUZE/BANDAGES/DRESSINGS) ×1
DERMABOND ADVANCED .7 DNX12 (GAUZE/BANDAGES/DRESSINGS) ×2 IMPLANT
DRAPE ARM DVNC X/XI (DISPOSABLE) ×8 IMPLANT
DRAPE COLUMN DVNC XI (DISPOSABLE) ×2 IMPLANT
DRAPE DA VINCI XI ARM (DISPOSABLE) ×4
DRAPE DA VINCI XI COLUMN (DISPOSABLE) ×1
DRAPE SHEET LG 3/4 BI-LAMINATE (DRAPES) ×3 IMPLANT
DRAPE SURG IRRIG POUCH 19X23 (DRAPES) ×3 IMPLANT
ELECT REM PT RETURN 15FT ADLT (MISCELLANEOUS) ×3 IMPLANT
GLOVE BIO SURGEON STRL SZ 6 (GLOVE) ×12 IMPLANT
GLOVE BIO SURGEON STRL SZ 6.5 (GLOVE) ×2 IMPLANT
GOWN STRL REUS W/ TWL LRG LVL3 (GOWN DISPOSABLE) ×8 IMPLANT
GOWN STRL REUS W/TWL LRG LVL3 (GOWN DISPOSABLE) ×12
HOLDER FOLEY CATH W/STRAP (MISCELLANEOUS) ×2 IMPLANT
IRRIG SUCT STRYKERFLOW 2 WTIP (MISCELLANEOUS) ×3
IRRIGATION SUCT STRKRFLW 2 WTP (MISCELLANEOUS) ×2 IMPLANT
KIT PROCEDURE DA VINCI SI (MISCELLANEOUS) ×1
KIT PROCEDURE DVNC SI (MISCELLANEOUS) IMPLANT
KIT TURNOVER KIT A (KITS) ×1 IMPLANT
MANIPULATOR UTERINE 4.5 ZUMI (MISCELLANEOUS) ×3 IMPLANT
NDL SPNL 18GX3.5 QUINCKE PK (NEEDLE) IMPLANT
NEEDLE HYPO 22GX1.5 SAFETY (NEEDLE) ×1 IMPLANT
NEEDLE SPNL 18GX3.5 QUINCKE PK (NEEDLE) ×3 IMPLANT
OBTURATOR OPTICAL STANDARD 8MM (TROCAR) ×1
OBTURATOR OPTICAL STND 8 DVNC (TROCAR) ×2
OBTURATOR OPTICALSTD 8 DVNC (TROCAR) ×2 IMPLANT
PACK ROBOT GYN CUSTOM WL (TRAY / TRAY PROCEDURE) ×3 IMPLANT
PAD POSITIONING PINK XL (MISCELLANEOUS) ×3 IMPLANT
PORT ACCESS TROCAR AIRSEAL 12 (TROCAR) ×2 IMPLANT
PORT ACCESS TROCAR AIRSEAL 5M (TROCAR) ×1
POUCH SPECIMEN RETRIEVAL 10MM (ENDOMECHANICALS) IMPLANT
SEAL CANN UNIV 5-8 DVNC XI (MISCELLANEOUS) ×6 IMPLANT
SEAL XI 5MM-8MM UNIVERSAL (MISCELLANEOUS) ×3
SET TRI-LUMEN FLTR TB AIRSEAL (TUBING) ×3 IMPLANT
SURGIFLO W/THROMBIN 8M KIT (HEMOSTASIS) IMPLANT
SUT VIC AB 0 CT1 27 (SUTURE)
SUT VIC AB 0 CT1 27XBRD ANTBC (SUTURE) IMPLANT
SUT VIC AB 4-0 PS2 18 (SUTURE) ×6 IMPLANT
SYR 10ML LL (SYRINGE) ×1 IMPLANT
TOWEL OR NON WOVEN STRL DISP B (DISPOSABLE) ×3 IMPLANT
TRAP SPECIMEN MUCOUS 40CC (MISCELLANEOUS) IMPLANT
TRAY FOLEY MTR SLVR 16FR STAT (SET/KITS/TRAYS/PACK) ×3 IMPLANT
UNDERPAD 30X30 (UNDERPADS AND DIAPERS) ×3 IMPLANT
WATER STERILE IRR 1000ML POUR (IV SOLUTION) ×3 IMPLANT

## 2019-01-16 NOTE — Anesthesia Preprocedure Evaluation (Signed)
Anesthesia Evaluation  Patient identified by MRN, date of birth, ID band Patient awake    Reviewed: Allergy & Precautions, NPO status , Patient's Chart, lab work & pertinent test results  Airway Mallampati: I       Dental no notable dental hx. (+) Teeth Intact   Pulmonary neg pulmonary ROS,    Pulmonary exam normal breath sounds clear to auscultation       Cardiovascular negative cardio ROS Normal cardiovascular exam Rhythm:Regular Rate:Normal     Neuro/Psych negative neurological ROS  negative psych ROS   GI/Hepatic negative GI ROS, Neg liver ROS,   Endo/Other  Hypothyroidism   Renal/GU negative Renal ROS  negative genitourinary   Musculoskeletal   Abdominal Normal abdominal exam  (+)   Peds negative pediatric ROS (+)  Hematology  (+) anemia ,   Anesthesia Other Findings   Reproductive/Obstetrics negative OB ROS                             Anesthesia Physical Anesthesia Plan  ASA: II  Anesthesia Plan: General   Post-op Pain Management:    Induction: Intravenous  PONV Risk Score and Plan: 4 or greater and Ondansetron, Dexamethasone and Treatment may vary due to age or medical condition  Airway Management Planned: Oral ETT  Additional Equipment: None  Intra-op Plan:   Post-operative Plan: Extubation in OR  Informed Consent: I have reviewed the patients History and Physical, chart, labs and discussed the procedure including the risks, benefits and alternatives for the proposed anesthesia with the patient or authorized representative who has indicated his/her understanding and acceptance.     Dental advisory given  Plan Discussed with: CRNA  Anesthesia Plan Comments:         Anesthesia Quick Evaluation

## 2019-01-16 NOTE — Anesthesia Postprocedure Evaluation (Signed)
Anesthesia Post Note  Patient: Erica [name]  Procedure(s) Performed: XI ROBOTIC ASSISTED TOTAL HYSTERECTOMY WITH BILATERAL SALPINGO OOPHORECTOMY (Bilateral ) SENTINEL LYMPH NODE BIOPSY (N/A )     Patient location during evaluation: PACU Anesthesia Type: General Level of consciousness: sedated Pain management: pain level controlled Vital Signs Assessment: post-procedure vital signs reviewed and stable Respiratory status: spontaneous breathing Cardiovascular status: stable Postop Assessment: no apparent nausea or vomiting Anesthetic complications: no    Last Vitals:  Vitals:   01/16/19 1030 01/16/19 1047  BP: (!) 154/62 (!) 148/71  Pulse: (!) 55 (!) 56  Resp: 14 14  Temp: (!) 36.3 C (!) 36.4 C  SpO2: 100% 100%    Last Pain:  Vitals:   01/16/19 1047  TempSrc:   PainSc: 0-No pain   Pain Goal:                   Huston Foley

## 2019-01-16 NOTE — Op Note (Signed)
OPERATIVE NOTE 01/16/19  Surgeon: Donaciano Eva   Assistants: Dr Lahoma Crocker (an MD assistant was necessary for tissue manipulation, management of robotic instrumentation, retraction and positioning due to the complexity of the case and hospital policies).   Anesthesia: General endotracheal anesthesia  ASA Class: 3   Pre-operative Diagnosis: endometrial cancer grade 3  Post-operative Diagnosis: same,   Operation: Robotic-assisted laparoscopic total hysterectomy with bilateral salpingoophorectomy, SLN biopsy   Surgeon: Donaciano Eva  Assistant Surgeon: Lahoma Crocker MD  Anesthesia: GET  Urine Output: 300cc  Operative Findings:  : 9cm uterus (mildly broadened and bulky), normal appearing fallopian tubes and ovaries.    Estimated Blood Loss:  <20cc      Total IV Fluids: 700 ml         Specimens: uterus, cervix, bilateral tubes and ovaries, SLN biopsy         Complications:  None; patient tolerated the procedure well.         Disposition: PACU - hemodynamically stable.  Procedure Details  The patient was seen in the Holding Room. The risks, benefits, complications, treatment options, and expected outcomes were discussed with the patient.  The patient concurred with the proposed plan, giving informed consent.  The site of surgery properly noted/marked. The patient was identified as Erica [name] and the procedure verified as a Robotic-assisted hysterectomy with bilateral salpingo oophorectomy with SLN biopsy. A Time Out was held and the above information confirmed.  After induction of anesthesia, the patient was draped and prepped in the usual sterile manner. Pt was placed in supine position after anesthesia and draped and prepped in the usual sterile manner. The abdominal drape was placed after the CholoraPrep had been allowed to dry for 3 minutes.  Her arms were tucked to her side with all appropriate precautions.  The shoulders were stabilized with padded  shoulder blocks applied to the acromium processes.  The patient was placed in the semi-lithotomy position in [place].  The perineum was prepped with Betadine. The patient was then prepped. Foley catheter was placed.  A sterile speculum was placed in the vagina.  The cervix was grasped with a single-tooth tenaculum. 2mg  total of ICG was injected into the cervical stroma at 2 and 9 o'clock with 1cc injected at a 1cm and 33mm depth (concentration 0.5mg /ml) in all locations. The cervix was dilated with Kennon Rounds dilators.  The ZUMI uterine manipulator with a medium colpotomizer ring was placed without difficulty.  A pneum occluder balloon was placed over the manipulator.  OG tube placement was confirmed and to suction.   Next, a 5 mm skin incision was made 1 cm below the subcostal margin in the midclavicular line.  The 5 mm Optiview port and scope was used for direct entry.  Opening pressure was under 10 mm CO2.  The abdomen was insufflated and the findings were noted as above.   At this point and all points during the procedure, the patient's intra-abdominal pressure did not exceed 15 mmHg. Next, a 10 mm skin incision was made in the umbilicus and a right and left port was placed about 10 cm lateral to the robot port on the right and left side.  All ports were placed under direct visualization. The patient was placed in steep Trendelenburg.  Bowel was folded away into the upper abdomen.  The robot was docked in the normal manner.  The right and left peritoneum were opened parallel to the IP ligament to open the retroperitoneal spaces bilaterally. The  SLN mapping was performed in bilateral pelvic basins. The para rectal and paravesical spaces were opened up entirely with careful dissection below the level of the ureters bilaterally and to the depth of the uterine artery origin in order to skeletonize the uterine "web" and ensure visualization of all parametrial channels. The para-aortic basins were carefully  exposed and evaluated for isolated para-aortic SLN's. Lymphatic channels were identified travelling to the following visualized sentinel lymph node's: left and right obturator SLN's. These SLN's were separated from their surrounding lymphatic tissue, removed and sent for permanent pathology.  The hysterectomy was started after the round ligament on the right side was incised and the retroperitoneum was entered and the pararectal space was developed.  The ureter was noted to be on the medial leaf of the broad ligament.  The peritoneum above the ureter was incised and stretched and the infundibulopelvic ligament was skeletonized, cauterized and cut.  The posterior peritoneum was taken down to the level of the KOH ring.  The anterior peritoneum was also taken down.  The bladder flap was created to the level of the KOH ring.  The uterine artery on the right side was skeletonized, cauterized and cut in the normal manner.  A similar procedure was performed on the left.  The colpotomy was made and the uterus, cervix, bilateral ovaries and tubes were amputated and delivered through the vagina.  Pedicles were inspected and excellent hemostasis was achieved.    The colpotomy at the vaginal cuff was closed with Vicryl on a CT1 needle in a running manner.  Irrigation was used and excellent hemostasis was achieved.  At this point in the procedure was completed.  Robotic instruments were removed under direct visulaization.  The robot was undocked. The 10 mm ports were closed with Vicryl on a UR-5 needle and the fascia was closed with 0 Vicryl on a UR-5 needle.  The skin was closed with 4-0 Vicryl in a subcuticular manner.  Dermabond was applied.  Sponge, lap and needle counts correct x 2.  The patient was taken to the recovery room in stable condition.  The vagina was swabbed with  minimal bleeding noted.   All instrument and needle counts were correct x  3.   The patient was transferred to the recovery room in a stable  condition.  Donaciano Eva, MD

## 2019-01-16 NOTE — Anesthesia Procedure Notes (Signed)
Procedure Name: Intubation Date/Time: 01/16/2019 7:34 AM Performed by: Nadyne Gariepy D, CRNA Pre-anesthesia Checklist: Patient identified, Emergency Drugs available, Suction available and Patient being monitored Patient Re-evaluated:Patient Re-evaluated prior to induction Oxygen Delivery Method: Circle system utilized Preoxygenation: Pre-oxygenation with 100% oxygen Induction Type: IV induction Ventilation: Mask ventilation without difficulty Laryngoscope Size: Mac and 3 Grade View: Grade I Tube type: Oral Tube size: 7.5 mm Number of attempts: 1 Airway Equipment and Method: Stylet Placement Confirmation: ETT inserted through vocal cords under direct vision,  positive ETCO2 and breath sounds checked- equal and bilateral Secured at: 20 cm Tube secured with: Tape Dental Injury: Teeth and Oropharynx as per pre-operative assessment

## 2019-01-16 NOTE — Discharge Instructions (Addendum)
Return to work: 4 weeks (2 weeks with physical restrictions). ° °Activity: °1. Be up and out of the bed during the day.  Take a nap if needed.  You may walk up steps but be careful and use the hand rail.  Stair climbing will tire you more than you think, you may need to stop part way and rest.  ° °2. No lifting or straining for 4 weeks. ° °3. No driving for 1 weeks.  Do Not drive if you are taking narcotic pain medicine. ° °4. Shower daily.  Use soap and water on your incision and pat dry; don't rub.  ° °5. No sexual activity and nothing in the vagina for 8 weeks. ° °Medications:  °- Take ibuprofen and tylenol first line for pain control. Take these regularly (every 6 hours) to decrease the build up of pain. ° °- If necessary, for severe pain not relieved by ibuprofen, contact Dr Rossi's office and you will be prescribed percocet. ° °- While taking percocet you should take sennakot every night to reduce the likelihood of constipation. If this causes diarrhea, stop its use. ° °Diet: °1. Low sodium Heart Healthy Diet is recommended. ° °2. It is safe to use a laxative if you have difficulty moving your bowels.  ° °Wound Care: °1. Keep clean and dry.  Shower daily. ° °Reasons to call the Doctor: ° °· Fever - Oral temperature greater than 100.4 degrees Fahrenheit °· Foul-smelling vaginal discharge °· Difficulty urinating °· Nausea and vomiting °· Increased pain at the site of the incision that is unrelieved with pain medicine. °· Difficulty breathing with or without chest pain °· New calf pain especially if only on one side °· Sudden, continuing increased vaginal bleeding with or without clots. °  °Follow-up: °1. See Emma Rossi in 4 weeks. ° °Contacts: °For questions or concerns you should contact: ° °Dr. Emma Rossi at [phone] °After hours and on week-ends call [phone] and ask to speak to the physician on call for Gynecologic Oncology ° °After Your Surgery ° °The information in this section will tell you what  to expect after your surgery, both during your stay and after you leave. You will learn how to safely recover from your surgery. Write down any questions you have and be sure to ask your doctor or nurse. °What to Expect °When you wake up after your surgery, you will be in the Post-Anesthesia Care Unit (PACU) or your °recovery room. °A nurse will be monitoring your body temperature, blood pressure, pulse, and oxygen levels. °You may have a urinary catheter in your bladder to help monitor the amount of urine you are making. It should come out before you go home. You will also have compression boots on your lower legs to help your circulation. °Your pain medication will be given through an IV line or in tablet form. If you are having pain, tell your nurse. °Your nurse will tell you how to recover from your surgery. Below are examples of ways you can help yourself recover safely. °• You will be encouraged to walk with the help of your nurse or physical therapist. We will give you medication to relieve pain. Walking helps reduce the risk for blood clots and pneumonia. It also helps to stimulate your bowels so they begin working again. °• Use your incentive spirometer. This will help your lungs expand, which prevents pneumonia. For more information, read How to Use Your Incentive Spirometer. °Commonly Asked Questions °Will I have pain after   surgery? °Yes, you will have some pain after your surgery, especially in the first few days. Your doctor and nurse will ask you about your pain often. You will be given medication to manage your pain as needed. If your pain is not relieved, please tell your doctor or nurse. It is important to control your pain so you can cough, breathe deeply, use your incentive spirometer, and get out of bed and walk. °Will I be able to eat? °Yes, you will be able to eat a regular diet or eat as tolerated. You should start with foods that are soft and easy to digest such as apple sauce and chicken  noodle soup. Eat small meals frequently, and then advance to regular foods. °If you experience bloating, gas, or cramps, limit high-fiber foods, including whole grain breads and cereal, nuts, seeds, salads, fresh fruit, broccoli, cabbage, and cauliflower. °Will I have pain when I am home? °The length of time each person has pain or discomfort varies. You may still have some pain when you go home and will probably be taking pain medication. Follow the guidelines below. °• Take your medications as directed and as needed. °• Call your doctor if the medication prescribed for you doesn't relieve your pain. °• Don't drive or drink alcohol while you're taking prescription pain medication. °• As your incision heals, you will have less pain and need less pain medication. A mild pain reliever such as acetaminophen (Tylenol®) or ibuprofen (Advil®) will relieve aches and discomfort. However, large quantities of acetaminophen may be harmful to your liver. Don't take more acetaminophen than the amount directed on the bottle or as instructed by your doctor or nurse. °• Pain medication should help you as you resume your normal activities. Take enough medication to do your exercises comfortably. Pain medication is most effective 30 to 45 minutes after taking it. °• Keep track of when you take your pain medication. Taking it when your pain first begins is more effective than waiting for the pain to get worse. °Pain medication may cause constipation (having fewer bowel movements than what is normal for you). °How can I prevent constipation? °• Go to the bathroom at the same time every day. Your body will get used to going at that time. °• If you feel the urge to go, don't put it off. Try to use the bathroom 5 to 15 minutes after meals. °• After breakfast is a good time to move your bowels. The reflexes in your colon are strongest at this time. °• Exercise, if you can. Walking is an excellent form of exercise. °• Drink 8 (8-ounce)  glasses (2 liters) of liquids daily, if you can. Drink water, juices, soups, ice cream shakes, and other drinks that don't have caffeine. Drinks with caffeine, such as coffee and soda, pull fluid out of the body. °• Slowly increase the fiber in your diet to 25 to 35 grams per day. Fruits, vegetables, whole grains, and cereals contain fiber. If you have an ostomy or have had recent bowel surgery, check with your doctor or nurse before making any changes in your diet. °• Both over-the-counter and prescription medications are available to treat constipation. Start with 1 of the following over-the-counter medications first: °o Docusate sodium (Colace®) 100 mg. Take ___1__ capsules _2____ times a day. This is a stool softener that causes few side effects. Don't take it with mineral oil. °o Polyethylene glycol (MiraLAX®) 17 grams daily. °o Senna (Senokot®) 2 tablets at bedtime. This is a stimulant laxative,   which can cause cramping. °• If you haven't had a bowel movement in 2 days, call your doctor or nurse. °Can I shower? °Yes, you should shower 24 hours after your surgery. Be sure to shower every day. °Taking a warm shower is relaxing and can help decrease muscle aches. Use soap when you shower and gently wash your incision. Pat the areas dry with a towel after showering, and leave your incision uncovered (unless there is drainage). Call your doctor if you see any redness or drainage from your incision. °Don't take tub baths until you discuss it with your doctor at the first appointment after your surgery. °How do I care for my incisions? °You will have several small incisions on your abdomen. The incisions are closed with Steri-Strips or Dermabond. You may also have square white dressings on your incisions (Primapore®). You can remove these in the shower 24 hours after your surgery. You should clean your incisions with soap and water. °If you go home with Steri-Strips on your incision, they will loosen and may fall off  by themselves. If they haven't fallen off within 10 days, you can remove them. °If you go home with Dermabond over your sutures (stitches), it will also loosen and peel off. °What are the most common symptoms after a hysterectomy? °It's common for you to have some vaginal spotting or light bleeding. You should monitor this with a pad or a panty liner. If you have having heavy bleeding (bleeding through a pad or liner every 1 to 2 hours), call your doctor right away. °It's also common to have some discomfort after surgery from the air that was pumped into your abdomen during surgery. To help with this, walk, drink plenty of liquids and make sure to take the stool softeners °you received. °When is it safe for me to drive? °You may resume driving 2 weeks after surgery, as long as you are not taking pain medication that may make you drowsy. °When can I resume sexual activity? °Do not place anything in your vagina or have vaginal intercourse for 8 weeks after your surgery. Some people will need to wait longer than 8 weeks, so speak with your doctor before resuming sexual intercourse. °Will I be able to travel? °Yes, you can travel. If you are traveling by plane within a few weeks after your surgery, make sure you get up and walk every hour. Be sure to stretch your legs, drink plenty of liquids, and keep your feet elevated when possible. °Will I need any supplies? °Most people do not need any supplies after the surgery. In the rare case that you do need supplies, such as tubes or drains, your nurse will order them for you. °When can I return to work? °The time it takes to return to work depends on the type of work you do, the type of surgery you had, and how fast your body heals. Most people can return to work about 2 to 4 weeks after the surgery. °What exercises can I do? °Exercise will help you gain strength and feel better. Walking and stair climbing are excellent forms of exercise. Gradually increase the distance you  walk. Climb stairs slowly, resting or stopping as needed. Ask your doctor or nurse before starting more strenuous exercises. °When can I lift heavy objects? °Most people should not lift anything heavier than 10 pounds (4.5 kilograms) for at least 4 weeks after surgery. Speak with your doctor about when you can do heavy lifting. °How can I cope with   my feelings? °After surgery for a serious illness, you may have new and upsetting feelings. Many people say they felt weepy, sad, worried, nervous, irritable, and angry at one time or another. You may find that you can't control some of these feelings. If this happens, it's a good idea to seek emotional support. °The first step in coping is to talk about how you feel. Family and friends can help. Your nurse, doctor, and social worker can reassure, support, and guide you. It's always a good idea to let these professionals know how you, your family, and your friends are feeling emotionally. Many resources are available to patients and their families. Whether you're in the hospital or at home, the nurses, doctors, and social workers are here to help you and your family and friends handle the emotional aspects of your illness. °When is my first appointment after surgery? °Your first appointment after surgery will be 2 to 4 weeks after surgery. Your nurse will give you instructions on how to make this appointment, including the phone number to call. °What if I have other questions? °If you have any questions or concerns, please talk with your doctor or nurse. You can reach them Monday through Friday from 9:00 am to 5:00 pm. °After 5:00 pm, during the weekend, and on holidays, call [phone] and ask for the doctor on call for your doctor. ° °• Have a temperature of 101° F (38.3° C) or higher °• Have pain that does not get better with pain medication °• Have redness, drainage, or swelling from your incisions °

## 2019-01-16 NOTE — Transfer of Care (Signed)
Immediate Anesthesia Transfer of Care Note  Patient: Erica [name]  Procedure(s) Performed: XI ROBOTIC ASSISTED TOTAL HYSTERECTOMY WITH BILATERAL SALPINGO OOPHORECTOMY (Bilateral ) SENTINEL LYMPH NODE BIOPSY (N/A )  Patient Location: PACU  Anesthesia Type:General  Level of Consciousness: awake, alert  and oriented  Airway & Oxygen Therapy: Patient Spontanous Breathing and Patient connected to nasal cannula oxygen  Post-op Assessment: Report given to RN and Post -op Vital signs reviewed and stable  Post vital signs: Reviewed and stable  Last Vitals:  Vitals Value Taken Time  BP 161/61 01/16/19 0925  Temp    Pulse 64 01/16/19 0926  Resp 15 01/16/19 0926  SpO2 100 % 01/16/19 0926  Vitals shown include unvalidated device data.  Last Pain:  Vitals:   01/16/19 0555  TempSrc: Oral         Complications: No apparent anesthesia complications

## 2019-01-16 NOTE — Interval H&P Note (Signed)
History and Physical Interval Note:  01/16/2019 7:09 AM  Erica [name]  has presented today for surgery, with the diagnosis of HIGH GRADE ENDOMETRIAL CANCER.  The various methods of treatment have been discussed with the patient and family. After consideration of risks, benefits and other options for treatment, the patient has consented to  Procedure(s): XI ROBOTIC ASSISTED TOTAL HYSTERECTOMY WITH BILATERAL SALPINGO OOPHORECTOMY (Bilateral) SENTINEL LYMPH NODE BIOPSY (N/A) as a surgical intervention.  The patient's history has been reviewed, patient examined, no change in status, stable for surgery.  I have reviewed the patient's chart and labs.  Questions were answered to the patient's satisfaction.     Thereasa Solo

## 2019-01-17 ENCOUNTER — Telehealth: Payer: Self-pay

## 2019-01-17 ENCOUNTER — Encounter (HOSPITAL_COMMUNITY): Payer: Self-pay | Admitting: Gynecologic Oncology

## 2019-01-17 NOTE — Telephone Encounter (Signed)
Erica [name] is doing well after her surgery yesterday. Afebrile. Eating, drinking, and urinating well. Denies any urinary burning. Passing gas. No BM.  She will begin the 2 senokot -S today with 8 oz of water.  Incisions are D&I. Used tramadol x 2 yesterday.  Has not needed to take any today.   Pt knows to call [phone] if she has nay questions or concerns. Reviewed phone visit appointment on 01-22-19 with Dr. Denman George. Post op appointment 02-10-19 at 3:15 pm in the office. Pt verbalized understanding and wrote appointments down.

## 2019-01-21 ENCOUNTER — Telehealth: Payer: Self-pay | Admitting: Gynecologic Oncology

## 2019-01-21 NOTE — Telephone Encounter (Signed)
Confirmed phone visit with pt and verified demographic info

## 2019-01-22 ENCOUNTER — Inpatient Hospital Stay: Payer: Medicare Other | Attending: Gynecologic Oncology | Admitting: Gynecologic Oncology

## 2019-01-22 ENCOUNTER — Other Ambulatory Visit: Payer: Self-pay | Admitting: Oncology

## 2019-01-22 ENCOUNTER — Encounter: Payer: Self-pay | Admitting: Gynecologic Oncology

## 2019-01-22 DIAGNOSIS — Z7189 Other specified counseling: Secondary | ICD-10-CM

## 2019-01-22 DIAGNOSIS — Z90710 Acquired absence of both cervix and uterus: Secondary | ICD-10-CM | POA: Diagnosis not present

## 2019-01-22 DIAGNOSIS — C541 Malignant neoplasm of endometrium: Secondary | ICD-10-CM | POA: Diagnosis not present

## 2019-01-22 DIAGNOSIS — Z90722 Acquired absence of ovaries, bilateral: Secondary | ICD-10-CM | POA: Diagnosis not present

## 2019-01-22 NOTE — Progress Notes (Signed)
Gynecologic Oncology Telehealth Follow-up Note: Gyn-Onc  I connected with Erica [name] on 01/22/19 at  3:30 PM EDT by telephone and verified that I am speaking with the correct person using two identifiers.  I discussed the limitations, risks, security and privacy concerns of performing an evaluation and management service by telemedicine and the availability of in-person appointments. I also discussed with the patient that there may be a patient responsible charge related to this service. The patient expressed understanding and agreed to proceed.  Other persons participating in the visit and their role in the encounter: none.  Patient's location: home Provider's location: [place]  Chief Complaint:  Chief Complaint  Patient presents with  . endometrial cancer    counseling and coordination of care    Assessment/Plan: [name] is a 68 y.o with stage IA grade 3 endometrioid endometrial cancer. MSI pending.  High/intermediate risk factors for recurrence. Recommendation is for vaginal brachytherapy to reduce risk for local recurrence in accordance with NCCN guidelines.  I discussed this with the patient. I discussed the role of adjuvant therapy. I discussed prognosis and risk for recurrence. We reviewed symptoms concerning for recurrence and she will see me if these develop prior to her scheduled appointment.  After completing adjuvant therapy I recommend she follow-up at 3 monthly intervals for symptom review, physical examination and pelvic examination. Pap smear is not recommended in routine endometrial cancer surveillance. After 2 years we will space these visits to every 6 months, and then annually if recurrence has not developed within 5 years. All questions were answered.   HPI: [name] is a 68 y.o.  G5 P3 with a history of right breast cancer who received adjuvant tamoxifen for 10 years, 2000 62,016.[name]  She reports vaginal  spotting with pain that began on December 07, 2018.  A transvaginal ultrasound was notable for a uterus measuring 8.3 x 4.4 x 5.1 cm with an endometrial stripe of 1.53 cm.  Neither ovary was visualized.  An endometrial biopsy was obtained on December 26, 2018 and returned with a grade 3 endometrioid adenocarcinoma.    The patient denies nausea vomiting or changes in bowel or bladder habits.  She reports longstanding bladder urgency and a good appetite and there is been no recent weight loss.  She states that she is very active and involved in church activities and manages her own activities of daily living.  Her family history is notable for sister with breast cancer diagnosed in the 44s.  Interval Hx:  On 01/16/19 she underwent robotic assisted total hysterectomy, BSO, SLN biopsy. Surgery was uncomplicated. Final pathology revealed a FIGO grade 3 endometrioid endometrial adenocarcinoma. There was 2m of 231mmyometrial invasion (inner half) with no LVSI, no cervical or adnexal involvement and negative bilateral SLN's.MSI pending.  She was diagnosed with stage IA grade 3 endometrioid adenocarcinoma and, in accordance with NCCN guidelines, vaginal brachytherapy was recommended.   Review of Systems:  Constitutional  Feels well,   Cardiovascular  No chest pain, shortness of breath, or edema  Pulmonary  No cough or wheeze.  Gastro Intestinal  No nausea, vomitting, or diarrhoea. No bright red blood per rectum, no abdominal pain, change in bowel movement, or constipation.  Genito Urinary  No frequency, urgency, dysuria, urge incontinence occasional vaginal spotting musculo Skeletal  No myalgia, arthralgia, joint swelling or pain  Neurologic  No weakness, numbness, change in gait,  Psychology  No depression, anxiety, insomnia.    Current  Meds:  Outpatient Encounter Medications as of 01/22/2019  Medication Sig  . acetaminophen (TYLENOL) 500 MG tablet Take 500 mg by mouth every 6 (six) hours as needed  for moderate pain.   [name] alendronate (FOSAMAX) 70 MG tablet Take 70 mg by mouth once a week.   . Calcium Carb-Cholecalciferol (CALCIUM 600+D3) 600-200 MG-UNIT TABS Take 1 tablet by mouth daily.  [name] etodolac (LODINE) 400 MG tablet Take 400 mg by mouth daily as needed for moderate pain.   . fluticasone (FLONASE) 50 MCG/ACT nasal spray Place 1 spray into both nostrils daily.  [name] loratadine (CLARITIN) 10 MG tablet Take 10 mg by mouth daily.  . Multiple Vitamin (MULTIVITAMIN) tablet Take 1 tablet by mouth daily.  [name] senna-docusate (SENOKOT-S) 8.6-50 MG tablet Take 2 tablets by mouth at bedtime. For AFTER surgery, do not take if having diarrhea  . SYNTHROID 75 MCG tablet Take 75 mcg by mouth daily before breakfast.   . traMADol (ULTRAM) 50 MG tablet Take 1 tablet (50 mg total) by mouth every 6 (six) hours as needed. For AFTER surgery, do not take and drive   No facility-administered encounter medications on file as of 01/22/2019.     Allergy:  Allergies  Allergen Reactions  . Gadolinium Nausea And Vomiting     Desc: Pt developed nausea and vomiting after receiving 17cc Multihance. Came back for repeat study and did fine with Magnevist. [name], Onset Date: [id number]     Social Hx:   Social History   Socioeconomic History  . Marital status: Married    Spouse name: Not on file  . Number of children: Not on file  . Years of education: Not on file  . Highest education level: Not on file  Occupational History  . Not on file  Social Needs  . Financial resource strain: Not on file  . Food insecurity    Worry: Not on file    Inability: Not on file  . Transportation needs    Medical: Not on file    Non-medical: Not on file  Tobacco Use  . Smoking status: Never Smoker  . Smokeless tobacco: Never Used  Substance and Sexual Activity  . Alcohol use: Never    Frequency: Never  . Drug use: Never  . Sexual activity: Yes  Lifestyle  . Physical activity    Days per week: Not on file     Minutes per session: Not on file  . Stress: Not on file  Relationships  . Social Herbalist on phone: Not on file    Gets together: Not on file    Attends religious service: Not on file    Active member of club or organization: Not on file    Attends meetings of clubs or organizations: Not on file    Relationship status: Not on file  . Intimate partner violence    Fear of current or ex partner: Not on file    Emotionally abused: Not on file    Physically abused: Not on file    Forced sexual activity: Not on file  Other Topics Concern  . Not on file  Social History Narrative  . Not on file    Past Surgical Hx:  Past Surgical History:  Procedure Laterality Date  . BREAST LUMPECTOMY Right 2006   With Lymphnode discetion  . NM I-131 ABLATION W/THYROGEN ([place] HX)  1970  . OTHER SURGICAL HISTORY  1991   HNP with Fusion   . ROBOTIC ASSISTED  TOTAL HYSTERECTOMY WITH BILATERAL SALPINGO OOPHERECTOMY Bilateral 01/16/2019   Procedure: XI ROBOTIC ASSISTED TOTAL HYSTERECTOMY WITH BILATERAL SALPINGO OOPHORECTOMY;  Surgeon: Everitt Amber, MD;  Location: [facility];  Service: Gynecology;  Laterality: Bilateral;  . SENTINEL NODE BIOPSY N/A 01/16/2019   Procedure: SENTINEL LYMPH NODE BIOPSY;  Surgeon: Everitt Amber, MD;  Location: [facility];  Service: Gynecology;  Laterality: N/A;  . TUBAL LIGATION Bilateral 1975    Past Medical Hx:  Past Medical History:  Diagnosis Date  . Allergy   . Arthritis   . Breast cancer ([place])    right/2006  . Osteoarthritis   . Osteopenia   . Thyroid disease    Hypothyroidism  Patient is unaware of the stage of the breast cancer but states that she received lumpectomy sentinel lymph node dissection adjuvant radiation and chemotherapy followed by tamoxifen treatment for approximately 10 years  Past Gynecological History: Gravida 5 para 3 menopause occurred in the 62s denies any history of abnormal Pap and denies use of any hormonal agents for birth control  family  Hx:  Family History  Problem Relation Age of Onset  . Hypertension Mother   . Heart disease Mother   . Cirrhosis Father   . Colon cancer Neg Hx   . Liver disease Neg Hx   . Colon polyps Neg Hx     Vitals:  There were no vitals taken for this visit.  Physical Exam: Deferred due to telephone visit.  I discussed the assessment and treatment plan with the patient. The patient was provided with an opportunity to ask questions and all were answered. The patient agreed with the plan and demonstrated an understanding of the instructions.   The patient was advised to call back or see an in-person evaluation if the symptoms worsen or if the condition fails to improve as anticipated.   I provided 10 minutes of non face-to-face telephone visit time during this encounter, and > 50% was spent counseling as documented under my assessment & plan.    Thereasa Solo, MD, 01/22/2019, 2:44 PM

## 2019-01-23 ENCOUNTER — Other Ambulatory Visit: Payer: Self-pay | Admitting: Oncology

## 2019-02-05 ENCOUNTER — Encounter: Payer: Self-pay | Admitting: Radiation Oncology

## 2019-02-05 ENCOUNTER — Encounter (INDEPENDENT_AMBULATORY_CARE_PROVIDER_SITE_OTHER): Payer: Self-pay

## 2019-02-05 ENCOUNTER — Ambulatory Visit
Admission: RE | Admit: 2019-02-05 | Discharge: 2019-02-05 | Disposition: A | Discharge status: Home / Self Care | Payer: Medicare Other | Source: Ambulatory Visit | Attending: Radiation Oncology | Admitting: Radiation Oncology

## 2019-02-05 ENCOUNTER — Other Ambulatory Visit: Payer: Self-pay

## 2019-02-05 VITALS — BP 151/72 | HR 57 | Temp 98.7°F | Resp 18 | Ht 62.0 in | Wt 140.0 lb

## 2019-02-05 DIAGNOSIS — Z79899 Other long term (current) drug therapy: Secondary | ICD-10-CM | POA: Insufficient documentation

## 2019-02-05 DIAGNOSIS — C541 Malignant neoplasm of endometrium: Secondary | ICD-10-CM | POA: Insufficient documentation

## 2019-02-05 DIAGNOSIS — M8580 Other specified disorders of bone density and structure, unspecified site: Secondary | ICD-10-CM | POA: Insufficient documentation

## 2019-02-05 DIAGNOSIS — Z923 Personal history of irradiation: Secondary | ICD-10-CM | POA: Insufficient documentation

## 2019-02-05 DIAGNOSIS — M1990 Unspecified osteoarthritis, unspecified site: Secondary | ICD-10-CM | POA: Insufficient documentation

## 2019-02-05 DIAGNOSIS — C50511 Malignant neoplasm of lower-outer quadrant of right female breast: Secondary | ICD-10-CM

## 2019-02-05 DIAGNOSIS — Z90710 Acquired absence of both cervix and uterus: Secondary | ICD-10-CM | POA: Insufficient documentation

## 2019-02-05 DIAGNOSIS — Z800 Family history of malignant neoplasm of digestive organs: Secondary | ICD-10-CM | POA: Insufficient documentation

## 2019-02-05 DIAGNOSIS — Z853 Personal history of malignant neoplasm of breast: Secondary | ICD-10-CM | POA: Diagnosis not present

## 2019-02-05 DIAGNOSIS — Z90722 Acquired absence of ovaries, bilateral: Secondary | ICD-10-CM | POA: Insufficient documentation

## 2019-02-05 DIAGNOSIS — E039 Hypothyroidism, unspecified: Secondary | ICD-10-CM | POA: Diagnosis not present

## 2019-02-05 NOTE — Progress Notes (Signed)
GYN Location of Tumor / Histology:  stage IA grade 3 endometrioid endometrial cancer  Erica [name] presented with symptoms of: She reports vaginal spotting with pain that began on December 07, 2018.  A transvaginal ultrasound was notable for a uterus measuring 8.3 x 4.4 x 5.1 cm with an endometrial stripe of 1.53 cm.  Neither ovary was visualized.  An endometrial biopsy was obtained on December 26, 2018 and returned with a grade 3 endometrioid adenocarcinoma.    Biopsies revealed: 01/16/19:  Diagnosis 1. Lymph node, sentinel, biopsy, left obturator - NO CARCINOMA IDENTIFIED IN ONE LYMPH NODE (0/1) - SEE COMMENT 2. Lymph node, sentinel, biopsy, right obturator - NO CARCINOMA IDENTIFIED IN ONE LYMPH NODE (0/1) - SEE COMMENT 3. Uterus +/- tubes/ovaries, neoplastic, cervix, bilateral fallopian tubes and ovaries UTERUS: - ENDOMETRIOID CARCINOMA, FIGO GRADE 3, CONFINED TO THE UTERUS - SEE ONCOLOGY TABLE BELOW CERVIX: - NO CARCINOMA IDENTIFIED BILATERAL OVARIES: - NO CARCINOMA IDENTIFIED BILATERAL FALLOPIAN TUBES: - BENIGN PARATUBAL CYSTS (RIGHT) - NO CARCINOMA IDENTIFIED  Past/Anticipated interventions by Gyn/Onc surgery, if any: 01/16/19:  Operation: Robotic-assisted laparoscopic total hysterectomy with bilateral salpingoophorectomy, SLN biopsy   Surgeon: Donaciano Eva  Past/Anticipated interventions by medical oncology, if any: None at this time  Weight changes, if any:  Wt Readings from Last 3 Encounters:  02/05/19 140 lb (63.5 kg)  01/13/19 135 lb (61.2 kg)  01/09/19 135 lb 9.6 oz (61.5 kg)     Bowel/Bladder complaints, if any: pt reports pressure during urination. Pt denies dysuria/hematuria. Pt denies vaginal bleeding/discharge. Pt denies rectal bleeding, diarrhea/constipation.  Nausea/Vomiting, if any: Pt denies abdominal bloating, N/V.  Pain issues, if any:  Pt denies c/o pain.  SAFETY ISSUES:  Prior radiation? No  Pacemaker/ICD? No  Possible current  pregnancy? No  Is the patient on methotrexate? No  Current Complaints / other details:  Pt presents today for initial consult with Dr. Sondra Come for Radiation Oncology.   BP (!) 151/72 (BP Location: Left Arm, Patient Position: Sitting)   Pulse (!) 57   Temp 98.7 F (37.1 C) (Temporal)   Resp 18   Ht 5\' 2"  (1.575 m)   Wt 140 lb (63.5 kg)   SpO2 100%   BMI 25.61 kg/m   Loma Sousa, RN BSN

## 2019-02-05 NOTE — Progress Notes (Signed)
Radiation Oncology         [phone] ________________________________  Initial Outpatient Consultation  Name: Erica [name] MRN: [id number]  Date: 02/05/2019  DOB: [birth year]  [id number], Margaretha Sheffield, MD  Everitt Amber, MD   REFERRING PHYSICIAN: Everitt Amber, MD  DIAGNOSIS: Stage IA grade 3 endometrioid endometrial cancer  HISTORY OF PRESENT ILLNESS::Erica [name] is a 68 y.o. female who is accompanied by her daughter over the phone. She has a history of right breast cancer, diagnosed in 02/2004 and treated with right lumpectomy, chemotherapy under Dr. Jana Hakim, adjuvant radiation under Dr. Valere Dross, and anastrozole/tamoxifen for 10 years. The patient presented with painful vaginal spotting beginning 12/07/2018. Pap smear performed on 12/11/2018 was negative. She underwent transvaginal ultrasound that showed an endometrial stripe measuring 1.53 cm.   The patient underwent a biopsy on 12/26/2018 showing: grade 3 endometrioid adenocarcinoma. She was referred to Dr. Skeet Latch on 01/02/2019, who performed cervical biopsy due to mottled appearance of the cervix on exam. Fortunately, pathology showed only a benign polyp. She also underwent PET scan on 01/08/2019, which was negative for local or distant metastatic disease.  She proceeded to hysterectomy with BSO on 01/16/2019 under Dr. Denman George. Pathology from the procedure showed endometrioid carcinoma, FIGO grade 3, confined to the uterus. Dr. Denman George recommended vaginal brachytherapy to reduce risk of local recurrence.   PREVIOUS RADIATION THERAPY: Yes  2006: Right Breast (Dr. Valere Dross)  PAST MEDICAL HISTORY:  Past Medical History:  Diagnosis Date  . Allergy   . Arthritis   . Breast cancer ([place])    right/2006  . Osteoarthritis   . Osteopenia   . Thyroid disease    Hypothyroidism    PAST SURGICAL HISTORY: Past Surgical History:  Procedure Laterality Date  . BREAST LUMPECTOMY Right 2006   With Lymphnode discetion  . NM I-131 ABLATION  W/THYROGEN ([place] HX)  1970  . OTHER SURGICAL HISTORY  1991   HNP with Fusion   . ROBOTIC ASSISTED TOTAL HYSTERECTOMY WITH BILATERAL SALPINGO OOPHERECTOMY Bilateral 01/16/2019   Procedure: XI ROBOTIC ASSISTED TOTAL HYSTERECTOMY WITH BILATERAL SALPINGO OOPHORECTOMY;  Surgeon: Everitt Amber, MD;  Location: [facility];  Service: Gynecology;  Laterality: Bilateral;  . SENTINEL NODE BIOPSY N/A 01/16/2019   Procedure: SENTINEL LYMPH NODE BIOPSY;  Surgeon: Everitt Amber, MD;  Location: [facility];  Service: Gynecology;  Laterality: N/A;  . TUBAL LIGATION Bilateral 1975    FAMILY HISTORY:  Family History  Problem Relation Age of Onset  . Hypertension Mother   . Heart disease Mother   . Cirrhosis Father   . Colon cancer Neg Hx   . Liver disease Neg Hx   . Colon polyps Neg Hx     SOCIAL HISTORY:  Social History   Tobacco Use  . Smoking status: Never Smoker  . Smokeless tobacco: Never Used  Substance Use Topics  . Alcohol use: Never    Frequency: Never  . Drug use: Never  Widowed, never driven a car   ALLERGIES:  Allergies  Allergen Reactions  . Gadolinium Nausea And Vomiting     Desc: Pt developed nausea and vomiting after receiving 17cc Multihance. Came back for repeat study and did fine with Magnevist. [name], Onset Date: [id number]     MEDICATIONS:  Current Outpatient Medications  Medication Sig Dispense Refill  . acetaminophen (TYLENOL) 500 MG tablet Take 500 mg by mouth every 6 (six) hours as needed for moderate pain.     [name] alendronate (FOSAMAX) 70 MG tablet Take 70 mg by mouth once  a week.     . Calcium Carb-Cholecalciferol (CALCIUM 600+D3) 600-200 MG-UNIT TABS Take 1 tablet by mouth daily.    [name] etodolac (LODINE) 400 MG tablet Take 400 mg by mouth daily as needed for moderate pain.     . fluticasone (FLONASE) 50 MCG/ACT nasal spray Place 1 spray into both nostrils daily.    [name] loratadine (CLARITIN) 10 MG tablet Take 10 mg by mouth daily.    . Multiple Vitamin (MULTIVITAMIN) tablet  Take 1 tablet by mouth daily.    [name] senna-docusate (SENOKOT-S) 8.6-50 MG tablet Take 2 tablets by mouth at bedtime. For AFTER surgery, do not take if having diarrhea 30 tablet 0  . SYNTHROID 75 MCG tablet Take 75 mcg by mouth daily before breakfast.     . traMADol (ULTRAM) 50 MG tablet Take 1 tablet (50 mg total) by mouth every 6 (six) hours as needed. For AFTER surgery, do not take and drive (Patient not taking: Reported on 02/05/2019) 10 tablet 0   No current facility-administered medications for this encounter.     REVIEW OF SYSTEMS:  A 10+ POINT REVIEW OF SYSTEMS WAS OBTAINED including neurology, dermatology, psychiatry, cardiac, respiratory, lymph, extremities, GI, GU, musculoskeletal, constitutional, reproductive, HEENT. She reports pressure during urination. She denies abdominal bloating, dysuria or hematuria, vaginal discharge or bleeding, rectal bleeding, pain, diarrhea or constipation, and nausea or vomiting.   PHYSICAL EXAM:  height is 5\' 2"  (1.575 m) and weight is 140 lb (63.5 kg). Her temporal temperature is 98.7 F (37.1 C). Her blood pressure is 151/72 (abnormal) and her pulse is 57 (abnormal). Her respiration is 18 and oxygen saturation is 100%.   General: Alert and oriented, in no acute distress HEENT: Head is normocephalic. Extraocular movements are intact.  Neck: Neck is supple, no palpable cervical or supraclavicular lymphadenopathy. Heart: Regular in rate and rhythm with no murmurs, rubs, or gallops. Chest: Clear to auscultation bilaterally, with no rhonchi, wheezes, or rales. Patient has tattoos in place from her previous radiation treatment. Abdomen: Soft, nontender, nondistended, with no rigidity or guarding. Scars healing well without signs of drainage or infection. Extremities: No cyanosis or edema. Lymphatics: see Neck Exam Skin: No concerning lesions. Musculoskeletal: symmetric strength and muscle tone throughout. Neurologic: Cranial nerves II through XII are grossly  intact. No obvious focalities. Speech is fluent. Coordination is intact. Psychiatric: Judgment and insight are intact. Affect is appropriate. Left Breast: no palpable mass, nipple discharge or bleeding.  Right breast: patient has a lumpectomy scar in the lower-outer quadrant with associated architectural distortion.  No palpable mass nipple discharge or bleeding Pelvic exam deferred in light of recent surgery.  ECOG = 1  0 - Asymptomatic (Fully active, able to carry on all predisease activities without restriction)  1 - Symptomatic but completely ambulatory (Restricted in physically strenuous activity but ambulatory and able to carry out work of a light or sedentary nature. For example, light housework, office work)  2 - Symptomatic, <50% in bed during the day (Ambulatory and capable of all self care but unable to carry out any work activities. Up and about more than 50% of waking hours)  3 - Symptomatic, >50% in bed, but not bedbound (Capable of only limited self-care, confined to bed or chair 50% or more of waking hours)  4 - Bedbound (Completely disabled. Cannot carry on any self-care. Totally confined to bed or chair)  5 - Death   Eustace Pen MM, Creech RH, Tormey DC, et al. [phone]). "Toxicity and response criteria of  the [facility]". [place] Oncol. 5 (6): 649-55  LABORATORY DATA:  Lab Results  Component Value Date   WBC 5.3 01/13/2019   HGB 10.8 (L) 01/13/2019   HCT 34.0 (L) 01/13/2019   MCV 105.3 (H) 01/13/2019   PLT 204 01/13/2019   NEUTROABS 2.9 12/08/2014   Lab Results  Component Value Date   NA 141 01/13/2019   K 3.8 01/13/2019   CL 105 01/13/2019   CO2 30 01/13/2019   GLUCOSE 98 01/13/2019   CREATININE 0.67 01/13/2019   CALCIUM 9.4 01/13/2019      RADIOGRAPHY: Nm Pet Image Initial (pi) Skull Base To Thigh  Result Date: 01/09/2019 CLINICAL DATA:  Initial treatment strategy for newly diagnosed high-grade endometrial carcinoma with cervical  involvement. EXAM: NUCLEAR MEDICINE PET SKULL BASE TO THIGH TECHNIQUE: 6.9 mCi F-18 FDG was injected intravenously. Full-ring PET imaging was performed from the skull base to thigh after the radiotracer. CT data was obtained and used for attenuation correction and anatomic localization. Fasting blood glucose: 84 mg/dl COMPARISON:  None. FINDINGS: Mediastinal blood-pool activity (background): SUV max = 2.0 Liver activity (reference): SUV max = N/A NECK:  No hypermetabolic lymph nodes or masses. Incidental CT findings:  None. CHEST: No hypermetabolic masses or lymphadenopathy. No suspicious pulmonary nodules seen on CT images. Incidental CT findings: Mild pulmonary parenchymal scarring noted bilaterally. Aortic and coronary artery atherosclerosis. ABDOMEN/PELVIS: No abnormal hypermetabolic activity within the liver, pancreas, adrenal glands, or spleen. No hypermetabolic lymph nodes in the abdomen or pelvis. Hypermetabolic activity is seen in the central uterus with SUV max of 9.3, consistent with known endometrial carcinoma. No extra-uterine hypermetabolic activity seen within the pelvis. Incidental CT findings:  None. SKELETON: No focal hypermetabolic bone lesions to suggest skeletal metastasis. Incidental CT findings:  None. IMPRESSION: Hypermetabolic activity within central uterus, consistent with known endometrial carcinoma. No evidence of local or distant metastatic disease. Electronically Signed   By: Marlaine Hind M.D.   On: 01/09/2019 09:09      IMPRESSION: Stage IA grade 3 endometrioid endometrial cancer  Given the pathologic findings, the patient would be at risk for vaginal cuff recurrence. I agree with Dr. Serita Grit recommendation for vaginal brachytherapy.  Today, I talked to the patient and family about the findings and work-up thus far.  We discussed the natural history of endometrial cancer and general treatment, highlighting the role of radiotherapy (vaginal brachytherapy) in the management.  We  discussed the available radiation techniques, and focused on the details of logistics and delivery.  We reviewed the anticipated acute and late sequelae associated with radiation in this setting.  The patient was encouraged to ask questions that I answered to the best of my ability.  A patient consent form was discussed and signed.  We retained a copy for our records.  The patient would like to proceed with radiation and will be scheduled for CT simulation.  PLAN: Patient will be set up for CT simulation, planning and treatment approximately 6 weeks after her surgery (roughly the mid to late October) assuming adequate healing.  Patient will receive 5 high-dose-rate treatments directed at the vaginal cuff using iridium 192 is the high-dose-rate source.    ------------------------------------------------  Blair Promise, PhD, MD  This document serves as a record of services personally performed by Gery Pray, MD. It was created on his behalf by Wilburn Mylar, a trained medical scribe. The creation of this record is based on the scribe's personal observations and the provider's statements to  them. This document has been checked and approved by the attending provider.

## 2019-02-05 NOTE — Patient Instructions (Signed)
Coronavirus (COVID-19) Are you at risk?  Are you at risk for the Coronavirus (COVID-19)?  To be considered HIGH RISK for Coronavirus (COVID-19), you have to meet the following criteria:  . Traveled to [place], [place], [place], [place] or [place]; or in the [place] to [place], [place], [place], or [place]; and have fever, cough, and shortness of breath within the last 2 weeks of travel OR . Been in close contact with a person diagnosed with COVID-19 within the last 2 weeks and have fever, cough, and shortness of breath . IF YOU DO NOT MEET THESE CRITERIA, YOU ARE CONSIDERED LOW RISK FOR COVID-19.  What to do if you are HIGH RISK for COVID-19?  . If you are having a medical emergency, call 911. . Seek medical care right away. Before you go to a doctor's office, urgent care or emergency department, call ahead and tell them about your recent travel, contact with someone diagnosed with COVID-19, and your symptoms. You should receive instructions from your physician's office regarding next steps of care.  . When you arrive at healthcare provider, tell the healthcare staff immediately you have returned from visiting [place], [place], [place], [place] or [place]; or traveled in the [place] to [place], [place], [place], or [place]; in the last two weeks or you have been in close contact with a person diagnosed with COVID-19 in the last 2 weeks.   . Tell the health care staff about your symptoms: fever, cough and shortness of breath. . After you have been seen by a medical provider, you will be either: o Tested for (COVID-19) and discharged home on quarantine except to seek medical care if symptoms worsen, and asked to  - Stay home and avoid contact with others until you get your results (4-5 days)  - Avoid travel on public transportation if possible (such as bus, train, or airplane) or o Sent to the Emergency Department by EMS for evaluation, COVID-19 testing, and possible  admission depending on your condition and test results.  What to do if you are LOW RISK for COVID-19?  Reduce your risk of any infection by using the same precautions used for avoiding the common cold or flu:  . Wash your hands often with soap and warm water for at least 20 seconds.  If soap and water are not readily available, use an alcohol-based hand sanitizer with at least 60% alcohol.  . If coughing or sneezing, cover your mouth and nose by coughing or sneezing into the elbow areas of your shirt or coat, into a tissue or into your sleeve (not your hands). . Avoid shaking hands with others and consider head nods or verbal greetings only. . Avoid touching your eyes, nose, or mouth with unwashed hands.  . Avoid close contact with people who are sick. . Avoid places or events with large numbers of people in one location, like concerts or sporting events. . Carefully consider travel plans you have or are making. . If you are planning any travel outside or inside the [place], visit the CDC's Travelers' Health webpage for the latest health notices. . If you have some symptoms but not all symptoms, continue to monitor at home and seek medical attention if your symptoms worsen. . If you are having a medical emergency, call 911.   ADDITIONAL HEALTHCARE OPTIONS FOR PATIENTS  [facility] / e-Visit: [email or web address]         [facility]: [phone]  [facility]: [phone]                   [facility]: [phone]   

## 2019-02-10 ENCOUNTER — Encounter: Payer: Self-pay | Admitting: Gynecologic Oncology

## 2019-02-10 ENCOUNTER — Inpatient Hospital Stay (HOSPITAL_BASED_OUTPATIENT_CLINIC_OR_DEPARTMENT_OTHER): Payer: Medicare Other | Admitting: Gynecologic Oncology

## 2019-02-10 ENCOUNTER — Other Ambulatory Visit: Payer: Self-pay

## 2019-02-10 VITALS — BP 172/57 | HR 57 | Temp 98.5°F | Resp 18 | Ht 62.0 in | Wt 141.6 lb

## 2019-02-10 DIAGNOSIS — Z90710 Acquired absence of both cervix and uterus: Secondary | ICD-10-CM

## 2019-02-10 DIAGNOSIS — Z7189 Other specified counseling: Secondary | ICD-10-CM

## 2019-02-10 DIAGNOSIS — Z90722 Acquired absence of ovaries, bilateral: Secondary | ICD-10-CM | POA: Diagnosis not present

## 2019-02-10 DIAGNOSIS — C541 Malignant neoplasm of endometrium: Secondary | ICD-10-CM

## 2019-02-10 NOTE — Progress Notes (Signed)
Gynecologic Oncology Follow-up Note    Chief Complaint:  Chief Complaint  Patient presents with  . endometrial cancer    counseling and co-ordination of care.     Assessment/Plan: [name] is a 68 y.o with stage IA grade 3 endometrioid endometrial cancer. MSI pending.  High/intermediate risk factors for recurrence. Recommendation is for vaginal brachytherapy to reduce risk for local recurrence in accordance with NCCN guidelines.  I discussed this with the patient. I discussed the role of adjuvant therapy. I discussed prognosis and risk for recurrence. We reviewed symptoms concerning for recurrence and she will see me if these develop prior to her scheduled appointment.  After completing adjuvant therapy I recommend she follow-up at 3 monthly intervals for symptom review, physical examination and pelvic examination. Pap smear is not recommended in routine endometrial cancer surveillance. After 2 years we will space these visits to every 6 months, and then annually if recurrence has not developed within 5 years. All questions were answered.   HPI: [name] is a 68 y.o.  G5 P3 with a history of right breast cancer who received adjuvant tamoxifen for 10 years, 2000 62,016.[name]  She reports vaginal spotting with pain that began on December 07, 2018.  A transvaginal ultrasound was notable for a uterus measuring 8.3 x 4.4 x 5.1 cm with an endometrial stripe of 1.53 cm.  Neither ovary was visualized.  An endometrial biopsy was obtained on December 26, 2018 and returned with a grade 3 endometrioid adenocarcinoma.    The patient denies nausea vomiting or changes in bowel or bladder habits.  She reports longstanding bladder urgency and a good appetite and there is been no recent weight loss.  She states that she is very active and involved in church activities and manages her own activities of daily living.  Her family history is notable for sister with breast cancer diagnosed in the  46s.  Interval Hx:  On 01/16/19 she underwent robotic assisted total hysterectomy, BSO, SLN biopsy. Surgery was uncomplicated. Final pathology revealed a FIGO grade 3 endometrioid endometrial adenocarcinoma. There was 33m of 27mmyometrial invasion (inner half) with no LVSI, no cervical or adnexal involvement and negative bilateral SLN's.MSI pending.  She was diagnosed with stage IA grade 3 endometrioid adenocarcinoma and, in accordance with NCCN guidelines, vaginal brachytherapy was recommended.   Review of Systems:  Constitutional  Feels well,   Cardiovascular  No chest pain, shortness of breath, or edema  Pulmonary  No cough or wheeze.  Gastro Intestinal  No nausea, vomitting, or diarrhoea. No bright red blood per rectum, no abdominal pain, change in bowel movement, or constipation.  Genito Urinary  No frequency, urgency, dysuria, urge incontinence occasional vaginal spotting musculo Skeletal  No myalgia, arthralgia, joint swelling or pain  Neurologic  No weakness, numbness, change in gait,  Psychology  No depression, anxiety, insomnia.    Current Meds:  Outpatient Encounter Medications as of 02/10/2019  Medication Sig  . acetaminophen (TYLENOL) 500 MG tablet Take 500 mg by mouth every 6 (six) hours as needed for moderate pain.   . Marland Kitchenlendronate (FOSAMAX) 70 MG tablet Take 70 mg by mouth once a week.   . Calcium Carb-Cholecalciferol (CALCIUM 600+D3) 600-200 MG-UNIT TABS Take 1 tablet by mouth daily.  . Marland Kitchentodolac (LODINE) 400 MG tablet Take 400 mg by mouth daily as needed for moderate pain.   . fluticasone (FLONASE) 50 MCG/ACT nasal spray Place 1 spray into both nostrils daily.  . Marland Kitchenoratadine (CLARITIN) 10  MG tablet Take 10 mg by mouth daily.  . Multiple Vitamin (MULTIVITAMIN) tablet Take 1 tablet by mouth daily.  [name] senna-docusate (SENOKOT-S) 8.6-50 MG tablet Take 2 tablets by mouth at bedtime. For AFTER surgery, do not take if having diarrhea  . SYNTHROID 75 MCG tablet Take 75 mcg  by mouth daily before breakfast.   . traMADol (ULTRAM) 50 MG tablet Take 1 tablet (50 mg total) by mouth every 6 (six) hours as needed. For AFTER surgery, do not take and drive (Patient not taking: Reported on 02/05/2019)   No facility-administered encounter medications on file as of 02/10/2019.     Allergy:  Allergies  Allergen Reactions  . Gadolinium Nausea And Vomiting     Desc: Pt developed nausea and vomiting after receiving 17cc Multihance. Came back for repeat study and did fine with Magnevist. [name], Onset Date: [id number]     Social Hx:   Social History   Socioeconomic History  . Marital status: Married    Spouse name: Not on file  . Number of children: Not on file  . Years of education: Not on file  . Highest education level: Not on file  Occupational History  . Not on file  Social Needs  . Financial resource strain: Not on file  . Food insecurity    Worry: Not on file    Inability: Not on file  . Transportation needs    Medical: Not on file    Non-medical: Not on file  Tobacco Use  . Smoking status: Never Smoker  . Smokeless tobacco: Never Used  Substance and Sexual Activity  . Alcohol use: Never    Frequency: Never  . Drug use: Never  . Sexual activity: Yes  Lifestyle  . Physical activity    Days per week: Not on file    Minutes per session: Not on file  . Stress: Not on file  Relationships  . Social Herbalist on phone: Not on file    Gets together: Not on file    Attends religious service: Not on file    Active member of club or organization: Not on file    Attends meetings of clubs or organizations: Not on file    Relationship status: Not on file  . Intimate partner violence    Fear of current or ex partner: Not on file    Emotionally abused: Not on file    Physically abused: Not on file    Forced sexual activity: Not on file  Other Topics Concern  . Not on file  Social History Narrative  . Not on file    Past Surgical Hx:   Past Surgical History:  Procedure Laterality Date  . BREAST LUMPECTOMY Right 2006   With Lymphnode discetion  . NM I-131 ABLATION W/THYROGEN ([facility] HX)  1970  . OTHER SURGICAL HISTORY  1991   HNP with Fusion   . ROBOTIC ASSISTED TOTAL HYSTERECTOMY WITH BILATERAL SALPINGO OOPHERECTOMY Bilateral 01/16/2019   Procedure: XI ROBOTIC ASSISTED TOTAL HYSTERECTOMY WITH BILATERAL SALPINGO OOPHORECTOMY;  Surgeon: Everitt Amber, MD;  Location: [facility];  Service: Gynecology;  Laterality: Bilateral;  . SENTINEL NODE BIOPSY N/A 01/16/2019   Procedure: SENTINEL LYMPH NODE BIOPSY;  Surgeon: Everitt Amber, MD;  Location: [facility];  Service: Gynecology;  Laterality: N/A;  . TUBAL LIGATION Bilateral 1975    Past Medical Hx:  Past Medical History:  Diagnosis Date  . Allergy   . Arthritis   . Breast cancer ([place])  right/2006  . Osteoarthritis   . Osteopenia   . Thyroid disease    Hypothyroidism  Patient is unaware of the stage of the breast cancer but states that she received lumpectomy sentinel lymph node dissection adjuvant radiation and chemotherapy followed by tamoxifen treatment for approximately 10 years  Past Gynecological History: Gravida 5 para 3 menopause occurred in the 19s denies any history of abnormal Pap and denies use of any hormonal agents for birth control  family Hx:  Family History  Problem Relation Age of Onset  . Hypertension Mother   . Heart disease Mother   . Cirrhosis Father   . Colon cancer Neg Hx   . Liver disease Neg Hx   . Colon polyps Neg Hx     Vitals:  Blood pressure (!) 172/57, pulse (!) 57, temperature 98.5 F (36.9 C), temperature source Oral, resp. rate 18, height 5' 2"  (1.575 m), weight 141 lb 9.6 oz (64.2 kg), SpO2 100 %.  Physical Exam: WD in NAD Neck  Supple NROM, without any enlargements.  Lymph Node Survey No cervical supraclavicular or inguinal adenopathy Cardiovascular  Pulse normal rate, regularity and rhythm. S1 and S2 normal.  Lungs  Clear to  auscultation bilateraly, without wheezes/crackles/rhonchi. Good air movement.  Skin  No rash/lesions/breakdown  Psychiatry  Alert and oriented to person, place, and time  Abdomen  Normoactive bowel sounds, abdomen soft, non-tender without evidence of hernia. Well healed incisions Back No CVA tenderness Genito Urinary  Vulva/vagina: Normal external female genitalia.  No lesions. No discharge or bleeding.  Bladder/urethra:  No lesions or masses, well supported bladder  Vaginal cuff healing normally, no blood, no lesions  Adnexa: no palpable masses. Rectal  deferred Extremities  No bilateral cyanosis, clubbing or edema.  30 minutes of direct face to face counseling time was spent with the patient. This included discussion about prognosis, therapy recommendations and postoperative side effects and are beyond the scope of routine postoperative care.  Thereasa Solo, MD, 02/10/2019, 5:16 PM

## 2019-02-10 NOTE — Patient Instructions (Signed)
Dr Denman George has cleared you to return to work in mid October.  She recommends 5 treatments of radiation with Dr Sondra Come for your stage 1 grade 3 uterine cancer. She will then see you back regularly for check ups. Dr Sondra Come will schedule the first of these.  Notify Dr Serita Grit office at [phone] if you have questions.

## 2019-03-04 ENCOUNTER — Telehealth: Payer: Self-pay | Admitting: *Deleted

## 2019-03-04 NOTE — Telephone Encounter (Signed)
CALLED PATIENT TO INFORM OF NEW HDR [facility], SPOKE WITH PATIENT AND SHE IS AWARE OF THESE APPTS. °

## 2019-03-05 ENCOUNTER — Telehealth: Payer: Self-pay | Admitting: *Deleted

## 2019-03-05 NOTE — Telephone Encounter (Signed)
Called patient to remind of New HDR [place] for 03-06-19, vm full unable to leave message, but I spoke with patient on 03-04-19 and she is aware of these appts.

## 2019-03-06 ENCOUNTER — Ambulatory Visit
Admission: RE | Admit: 2019-03-06 | Discharge: 2019-03-06 | Disposition: A | Discharge status: Home / Self Care | Payer: Medicare Other | Source: Ambulatory Visit | Attending: Radiation Oncology | Admitting: Radiation Oncology

## 2019-03-06 ENCOUNTER — Encounter: Payer: Self-pay | Admitting: Radiation Oncology

## 2019-03-06 ENCOUNTER — Other Ambulatory Visit: Payer: Self-pay

## 2019-03-06 VITALS — BP 171/70 | HR 68 | Temp 98.2°F | Resp 20 | Ht 62.0 in | Wt 138.4 lb

## 2019-03-06 DIAGNOSIS — C541 Malignant neoplasm of endometrium: Secondary | ICD-10-CM | POA: Insufficient documentation

## 2019-03-06 DIAGNOSIS — Z923 Personal history of irradiation: Secondary | ICD-10-CM | POA: Insufficient documentation

## 2019-03-06 DIAGNOSIS — Z79899 Other long term (current) drug therapy: Secondary | ICD-10-CM | POA: Insufficient documentation

## 2019-03-06 DIAGNOSIS — Z510 Encounter for antineoplastic radiation therapy: Secondary | ICD-10-CM | POA: Insufficient documentation

## 2019-03-06 NOTE — Progress Notes (Signed)
Pt presents today for new [place] HDR with Dr. Sondra Come. Pt denies c/o pain. Pt reports increased stress incontinence since hysterectomy. Pt denies dysuria/hematuria. Pt denies vaginal bleeding/discharge. Pt denies rectal bleeding. Pt reports occasional constipation and is relieved by Senokot.   Wt Readings from Last 3 Encounters:  03/06/19 138 lb 6 oz (62.8 kg)  02/10/19 141 lb 9.6 oz (64.2 kg)  02/05/19 140 lb (63.5 kg)   BP (!) 171/70 (BP Location: Left Arm, Patient Position: Sitting)   Pulse 68   Temp 98.2 F (36.8 C) (Temporal)   Resp 20   Ht 5\' 2"  (1.575 m)   Wt 138 lb 6 oz (62.8 kg)   SpO2 100%   BMI 25.31 kg/m   Loma Sousa, RN BSN

## 2019-03-06 NOTE — Progress Notes (Signed)
Radiation Oncology         [phone] ________________________________  Name: Erica [name] MRN: [id number]  Date: 03/06/2019  DOB: [birth year]  Vaginal Brachytherapy Procedure Note  CC: [name], MD [name], MD    ICD-10-CM   1. Endometrial cancer (HCC)  C54.1     Diagnosis: Stage IA grade 3 endometrioid endometrial cancer  Radiation Treatment Dates: n/a  Narrative: She returns today for vaginal cylinder fitting.   On review of systems, she reports increased stress incontinence since her hysterectomy and occasional constipation that is relieved by Senokot. She denies pain, dysuria or hematuria, vaginal discharge or bleeding, and rectal bleeding.   ALLERGIES: is allergic to gadolinium.  Meds: Current Outpatient Medications  Medication Sig Dispense Refill  . acetaminophen (TYLENOL) 500 MG tablet Take 500 mg by mouth every 6 (six) hours as needed for moderate pain.     [name] alendronate (FOSAMAX) 70 MG tablet Take 70 mg by mouth once a week.     . Calcium Carb-Cholecalciferol (CALCIUM 600+D3) 600-200 MG-UNIT TABS Take 1 tablet by mouth daily.    [name] etodolac (LODINE) 400 MG tablet Take 400 mg by mouth daily as needed for moderate pain.     . fluticasone (FLONASE) 50 MCG/ACT nasal spray Place 1 spray into both nostrils daily.    [name] loratadine (CLARITIN) 10 MG tablet Take 10 mg by mouth daily.    . Multiple Vitamin (MULTIVITAMIN) tablet Take 1 tablet by mouth daily.    [name] senna-docusate (SENOKOT-S) 8.6-50 MG tablet Take 2 tablets by mouth at bedtime. For AFTER surgery, do not take if having diarrhea 30 tablet 0  . SYNTHROID 75 MCG tablet Take 75 mcg by mouth daily before breakfast.     . traMADol (ULTRAM) 50 MG tablet Take 1 tablet (50 mg total) by mouth every 6 (six) hours as needed. For AFTER surgery, do not take and drive (Patient not taking: Reported on 02/05/2019) 10 tablet 0   No current facility-administered medications for this encounter.     Physical  Findings: The patient is in no acute distress. Patient is alert and oriented.  height is 5\' 2"  (1.575 m) and weight is 138 lb 6 oz (62.8 kg). Her temporal temperature is 98.2 F (36.8 C). Her blood pressure is 171/70 (abnormal) and her pulse is 68. Her respiration is 20 and oxygen saturation is 100%.   No palpable cervical, supraclavicular or axillary lymphoadenopathy. The heart has a regular rate and rhythm. The lungs are clear to auscultation. Abdomen soft and non-tender.  On pelvic examination the external genitalia were unremarkable. A speculum exam was performed. Vaginal cuff intact, no mucosal lesions. On bimanual exam there were no pelvic masses appreciated.  Lab Findings: Lab Results  Component Value Date   WBC 5.3 01/13/2019   HGB 10.8 (L) 01/13/2019   HCT 34.0 (L) 01/13/2019   MCV 105.3 (H) 01/13/2019   PLT 204 01/13/2019    Radiographic Findings: No results found.  Impression: Stage IA grade 3 endometrioid endometrial cancer  Patient was fitted for a vaginal cylinder. The patient will be treated with a 2.5 cm diameter cylinder with a treatment length of 3.0 cm. This distended the vaginal vault without undue discomfort. The patient tolerated the procedure well.  The patient was successfully fitted for a vaginal cylinder. The patient is appropriate to begin vaginal brachytherapy.   Plan: The patient will proceed with CT simulation and vaginal brachytherapy today.    _______________________________   Blair Promise, PhD,  MD  This document serves as a record of services personally performed by Gery Pray, MD. It was created on his behalf by Wilburn Mylar, a trained medical scribe. The creation of this record is based on the scribe's personal observations and the provider's statements to them. This document has been checked and approved by the attending provider.

## 2019-03-06 NOTE — Patient Instructions (Signed)
Coronavirus (COVID-19) Are you at risk?  Are you at risk for the Coronavirus (COVID-19)?  To be considered HIGH RISK for Coronavirus (COVID-19), you have to meet the following criteria:  . Traveled to [place], [place], [place], [place] or [place]; or in the [place] to [place], [place], [place], or [place]; and have fever, cough, and shortness of breath within the last 2 weeks of travel OR . Been in close contact with a person diagnosed with COVID-19 within the last 2 weeks and have fever, cough, and shortness of breath . IF YOU DO NOT MEET THESE CRITERIA, YOU ARE CONSIDERED LOW RISK FOR COVID-19.  What to do if you are HIGH RISK for COVID-19?  . If you are having a medical emergency, call 911. . Seek medical care right away. Before you go to a doctor's office, urgent care or emergency department, call ahead and tell them about your recent travel, contact with someone diagnosed with COVID-19, and your symptoms. You should receive instructions from your physician's office regarding next steps of care.  . When you arrive at healthcare provider, tell the healthcare staff immediately you have returned from visiting [place], [place], [place], [place] or [place]; or traveled in the [place] to [place], [place], [place], or [place]; in the last two weeks or you have been in close contact with a person diagnosed with COVID-19 in the last 2 weeks.   . Tell the health care staff about your symptoms: fever, cough and shortness of breath. . After you have been seen by a medical provider, you will be either: o Tested for (COVID-19) and discharged home on quarantine except to seek medical care if symptoms worsen, and asked to  - Stay home and avoid contact with others until you get your results (4-5 days)  - Avoid travel on public transportation if possible (such as bus, train, or airplane) or o Sent to the Emergency Department by EMS for evaluation, COVID-19 testing, and possible  admission depending on your condition and test results.  What to do if you are LOW RISK for COVID-19?  Reduce your risk of any infection by using the same precautions used for avoiding the common cold or flu:  . Wash your hands often with soap and warm water for at least 20 seconds.  If soap and water are not readily available, use an alcohol-based hand sanitizer with at least 60% alcohol.  . If coughing or sneezing, cover your mouth and nose by coughing or sneezing into the elbow areas of your shirt or coat, into a tissue or into your sleeve (not your hands). . Avoid shaking hands with others and consider head nods or verbal greetings only. . Avoid touching your eyes, nose, or mouth with unwashed hands.  . Avoid close contact with people who are sick. . Avoid places or events with large numbers of people in one location, like concerts or sporting events. . Carefully consider travel plans you have or are making. . If you are planning any travel outside or inside the [place], visit the CDC's Travelers' Health webpage for the latest health notices. . If you have some symptoms but not all symptoms, continue to monitor at home and seek medical attention if your symptoms worsen. . If you are having a medical emergency, call 911.   ADDITIONAL HEALTHCARE OPTIONS FOR PATIENTS  [facility] / e-Visit: [email or web address]         [facility]: [phone]  [facility]: [phone]                   [facility]: [phone]   

## 2019-03-06 NOTE — Progress Notes (Signed)
  Radiation Oncology         [phone] ________________________________  Name: Erica [name] MRN: [id number]  Date: 03/06/2019  DOB: 04/03/1951  SIMULATION AND TREATMENT PLANNING NOTE HDR BRACHYTHERAPY  DIAGNOSIS:  Stage IA grade 3 endometrioid endometrial cancer  NARRATIVE:  The patient was brought to the [facility] suite.  Identity was confirmed.  All relevant records and images related to the planned course of therapy were reviewed.  The patient freely provided informed written consent to proceed with treatment after reviewing the details related to the planned course of therapy. The consent form was witnessed and verified by the simulation staff.  Then, the patient was set-up in a stable reproducible  supine position for radiation therapy.  CT images were obtained.  Surface markings were placed.  The CT images were loaded into the planning software.  Then the target and avoidance structures were contoured.  Treatment planning then occurred.  The radiation prescription was entered and confirmed.   I have requested : Brachytherapy Isodose Plan and Dosimetry Calculations to plan the radiation distribution.    PLAN:  The patient will receive 30 Gy in 5 fractions.  Patient will be treated with a 2.5 cm diameter segmented cylinder with a treatment length of 3 cm.  Prescription will be to the mucosal surface.  Iridium 192 will be the high-dose-rate radiation source.  ________________________________  Blair Promise, PhD, MD   This document serves as a record of services personally performed by Gery Pray, MD. It was created on his behalf by Wilburn Mylar, a trained medical scribe. The creation of this record is based on the scribe's personal observations and the provider's statements to them. This document has been checked and approved by the attending provider.

## 2019-03-06 NOTE — Progress Notes (Signed)
  Radiation Oncology         (336) [phone] ________________________________  Name: Erica [name] MRN: [id number]  Date: 03/06/2019  DOB: April 13, 1951  CC: [name], MD  [name], MD  HDR BRACHYTHERAPY NOTE  DIAGNOSIS: Stage IA grade 3 endometrioid endometrial cancer   Simple treatment device note: Patient had construction of her custom vaginal cylinder. She will be treated with a 2.5 cm diameter segmented cylinder. This conforms to her anatomy without undue discomfort.  Vaginal brachytherapy procedure node: The patient was brought to the [place] suite. Identity was confirmed. All relevant records and images related to the planned course of therapy were reviewed. The patient freely provided informed written consent to proceed with treatment after reviewing the details related to the planned course of therapy. The consent form was witnessed and verified by the simulation staff. Then, the patient was set-up in a stable reproducible supine position for radiation therapy. Pelvic exam revealed the vaginal cuff to be intact . The patient's custom vaginal cylinder was placed in the proximal vagina. This was affixed to the CT/MR stabilization plate to prevent slippage. Patient tolerated the placement well.  Verification simulation note:  A fiducial marker was placed within the vaginal cylinder. An AP and lateral film was then obtained through the pelvis area. This documented accurate position of the vaginal cylinder for treatment.  HDR BRACHYTHERAPY TREATMENT  The remote afterloading device was affixed to the vaginal cylinder by catheter. Patient then proceeded to undergo her first high-dose-rate treatment directed at the proximal vagina. The patient was prescribed a dose of 6.0 gray to be delivered to the mucosal surface. Treatment length was 3.0 cm. Patient was treated with 1 channel using 7 dwell positions. Treatment time was 337.4 seconds. Iridium 192 was the high-dose-rate source for  treatment. The patient tolerated the treatment well. After completion of her therapy, a radiation survey was performed documenting return of the iridium source into the GammaMed safe.   PLAN: The patient will return next week for her second high-dose-rate treatment. ________________________________  Blair Promise, PhD, MD   This document serves as a record of services personally performed by Gery Pray, MD. It was created on his behalf by Wilburn Mylar, a trained medical scribe. The creation of this record is based on the scribe's personal observations and the provider's statements to them. This document has been checked and approved by the attending provider.

## 2019-03-12 ENCOUNTER — Telehealth: Payer: Self-pay | Admitting: *Deleted

## 2019-03-12 NOTE — Telephone Encounter (Signed)
Called patient to remind of HDR Tx. For 03-13-19 @ 9 am, spoke with patient and she is aware of this tx.

## 2019-03-13 ENCOUNTER — Ambulatory Visit
Admission: RE | Admit: 2019-03-13 | Discharge: 2019-03-13 | Disposition: A | Discharge status: Home / Self Care | Payer: Medicare Other | Source: Ambulatory Visit | Attending: Radiation Oncology | Admitting: Radiation Oncology

## 2019-03-13 ENCOUNTER — Other Ambulatory Visit: Payer: Self-pay

## 2019-03-13 DIAGNOSIS — C541 Malignant neoplasm of endometrium: Secondary | ICD-10-CM | POA: Diagnosis not present

## 2019-03-13 NOTE — Progress Notes (Signed)
  Radiation Oncology         [phone]) [phone] ________________________________  Name: Erica [name] MRN: [id number]  Date: 03/13/2019  DOB: 1950/11/01  CC: [name], MD  [name], MD  HDR BRACHYTHERAPY NOTE  DIAGNOSIS: Stage IA grade 3 endometrioid endometrial cancer   Simple treatment device note: Patient had construction of her custom vaginal cylinder. She will be treated with a 2.5 cm diameter segmented cylinder. This conforms to her anatomy without undue discomfort.  Vaginal brachytherapy procedure node: The patient was brought to the [facility] suite. Identity was confirmed. All relevant records and images related to the planned course of therapy were reviewed. The patient freely provided informed written consent to proceed with treatment after reviewing the details related to the planned course of therapy. The consent form was witnessed and verified by the simulation staff. Then, the patient was set-up in a stable reproducible supine position for radiation therapy. Pelvic exam revealed the vaginal cuff to be intact . The patient's custom vaginal cylinder was placed in the proximal vagina. This was affixed to the CT/MR stabilization plate to prevent slippage. Patient tolerated the placement well.  Verification simulation note:  A fiducial marker was placed within the vaginal cylinder. An AP and lateral film was then obtained through the pelvis area. This documented accurate position of the vaginal cylinder for treatment.  HDR BRACHYTHERAPY TREATMENT  The remote afterloading device was affixed to the vaginal cylinder by catheter. Patient then proceeded to undergo her second high-dose-rate treatment directed at the proximal vagina. The patient was prescribed a dose of 6.0 gray to be delivered to the mucosal surface. Treatment length was 3.0 cm. Patient was treated with 1 channel using 7 dwell positions. Treatment time was 145.1 seconds. Iridium 192 was the high-dose-rate source for  treatment. The patient tolerated the treatment well. After completion of her therapy, a radiation survey was performed documenting return of the iridium source into the Gamma[employer] safe.   PLAN: The patient will return in early November for her third high-dose-rate treatment. ________________________________  Blair Promise, PhD, MD   This document serves as a record of services personally performed by Gery Pray, MD. It was created on his behalf by Wilburn Mylar, a trained medical scribe. The creation of this record is based on the scribe's personal observations and the provider's statements to them. This document has been checked and approved by the attending provider.

## 2019-03-20 ENCOUNTER — Ambulatory Visit: Payer: Medicare Other | Admitting: Radiation Oncology

## 2019-03-21 ENCOUNTER — Other Ambulatory Visit: Payer: Self-pay

## 2019-03-21 ENCOUNTER — Ambulatory Visit
Admission: RE | Admit: 2019-03-21 | Discharge: 2019-03-21 | Disposition: A | Discharge status: Home / Self Care | Payer: Medicare Other | Source: Ambulatory Visit | Attending: Family Medicine | Admitting: Family Medicine

## 2019-03-21 DIAGNOSIS — M810 Age-related osteoporosis without current pathological fracture: Secondary | ICD-10-CM

## 2019-03-21 DIAGNOSIS — Z1231 Encounter for screening mammogram for malignant neoplasm of breast: Secondary | ICD-10-CM

## 2019-03-24 ENCOUNTER — Telehealth: Payer: Self-pay | Admitting: *Deleted

## 2019-03-24 NOTE — Telephone Encounter (Signed)
Called patient to remind of HDR Tx. for 03-25-19 @ 10 am, spoke with patient and she is aware of this tx.

## 2019-03-25 ENCOUNTER — Other Ambulatory Visit: Payer: Self-pay

## 2019-03-25 ENCOUNTER — Ambulatory Visit
Admission: RE | Admit: 2019-03-25 | Discharge: 2019-03-25 | Disposition: A | Discharge status: Home / Self Care | Payer: Medicare Other | Source: Ambulatory Visit | Attending: Radiation Oncology | Admitting: Radiation Oncology

## 2019-03-25 DIAGNOSIS — Z510 Encounter for antineoplastic radiation therapy: Secondary | ICD-10-CM | POA: Diagnosis not present

## 2019-03-25 DIAGNOSIS — C541 Malignant neoplasm of endometrium: Secondary | ICD-10-CM | POA: Insufficient documentation

## 2019-03-25 NOTE — Progress Notes (Signed)
  Radiation Oncology         [phone]) [phone] ________________________________  Name: Erica [name] MRN: [id number]  Date: 03/25/2019  DOB: 1950/07/01  CC: [name], MD  [name], MD  HDR BRACHYTHERAPY NOTE  DIAGNOSIS: Stage IA grade 3 endometrioid endometrial cancer   Simple treatment device note: Patient had construction of her custom vaginal cylinder. She will be treated with a 2.5 cm diameter segmented cylinder. This conforms to her anatomy without undue discomfort.  Vaginal brachytherapy procedure node: The patient was brought to the [facility] suite. Identity was confirmed. All relevant records and images related to the planned course of therapy were reviewed. The patient freely provided informed written consent to proceed with treatment after reviewing the details related to the planned course of therapy. The consent form was witnessed and verified by the simulation staff. Then, the patient was set-up in a stable reproducible supine position for radiation therapy. Pelvic exam revealed the vaginal cuff to be intact . The patient's custom vaginal cylinder was placed in the proximal vagina. This was affixed to the CT/MR stabilization plate to prevent slippage. Patient tolerated the placement well.  Verification simulation note:  A fiducial marker was placed within the vaginal cylinder. An AP and lateral film was then obtained through the pelvis area. This documented accurate position of the vaginal cylinder for treatment.  HDR BRACHYTHERAPY TREATMENT  The remote afterloading device was affixed to the vaginal cylinder by catheter. Patient then proceeded to undergo her third high-dose-rate treatment directed at the proximal vagina. The patient was prescribed a dose of 6.0 gray to be delivered to the mucosal surface. Treatment length was 3.0 cm. Patient was treated with 1 channel using 7 dwell positions. Treatment time was 162.2 seconds. Iridium 192 was the high-dose-rate source for  treatment. The patient tolerated the treatment well. After completion of her therapy, a radiation survey was performed documenting return of the iridium source into the Gamma[employer] safe.   PLAN: The patient will return later this week for her fourth high-dose-rate treatment. ________________________________  Blair Promise, PhD, MD   This document serves as a record of services personally performed by Gery Pray, MD. It was created on his behalf by Wilburn Mylar, a trained medical scribe. The creation of this record is based on the scribe's personal observations and the provider's statements to them. This document has been checked and approved by the attending provider.

## 2019-03-26 ENCOUNTER — Telehealth: Payer: Self-pay | Admitting: *Deleted

## 2019-03-26 NOTE — Telephone Encounter (Signed)
Called patient to remind of HDR Tx. For 03-27-19 @ 9 am, spoke with patient and she is aware of this tx.

## 2019-03-27 ENCOUNTER — Ambulatory Visit
Admission: RE | Admit: 2019-03-27 | Discharge: 2019-03-27 | Disposition: A | Discharge status: Home / Self Care | Payer: Medicare Other | Source: Ambulatory Visit | Attending: Radiation Oncology | Admitting: Radiation Oncology

## 2019-03-27 ENCOUNTER — Other Ambulatory Visit: Payer: Self-pay

## 2019-03-27 DIAGNOSIS — C541 Malignant neoplasm of endometrium: Secondary | ICD-10-CM

## 2019-03-27 NOTE — Progress Notes (Signed)
  Radiation Oncology         (336) [phone] ________________________________  Name: Erica [name] MRN: [id number]  Date: 03/27/2019  DOB: Nov 06, 1950  CC: [name], MD  [name], MD  HDR BRACHYTHERAPY NOTE  DIAGNOSIS: Stage IA grade 3 endometrioid endometrial cancer   Simple treatment device note: Patient had construction of her custom vaginal cylinder. She will be treated with a 2.5 cm diameter segmented cylinder. This conforms to her anatomy without undue discomfort.  Vaginal brachytherapy procedure node: The patient was brought to the [facility] suite. Identity was confirmed. All relevant records and images related to the planned course of therapy were reviewed. The patient freely provided informed written consent to proceed with treatment after reviewing the details related to the planned course of therapy. The consent form was witnessed and verified by the simulation staff. Then, the patient was set-up in a stable reproducible supine position for radiation therapy. Pelvic exam revealed the vaginal cuff to be intact . The patient's custom vaginal cylinder was placed in the proximal vagina. This was affixed to the CT/MR stabilization plate to prevent slippage. Patient tolerated the placement well.  Verification simulation note:  A fiducial marker was placed within the vaginal cylinder. An AP and lateral film was then obtained through the pelvis area. This documented accurate position of the vaginal cylinder for treatment.  HDR BRACHYTHERAPY TREATMENT  The remote afterloading device was affixed to the vaginal cylinder by catheter. Patient then proceeded to undergo her fourth high-dose-rate treatment directed at the proximal vagina. The patient was prescribed a dose of 6.0 gray to be delivered to the mucosal surface. Treatment length was 3.0 cm. Patient was treated with 1 channel using 7 dwell positions. Treatment time was 165.4 seconds. Iridium 192 was the high-dose-rate source for  treatment. The patient tolerated the treatment well. After completion of her therapy, a radiation survey was performed documenting return of the iridium source into the Gamma[employer] safe.   PLAN: The patient will return next week for her final high-dose-rate treatment. ________________________________  Blair Promise, PhD, MD   This document serves as a record of services personally performed by Gery Pray, MD. It was created on his behalf by Wilburn Mylar, a trained medical scribe. The creation of this record is based on the scribe's personal observations and the provider's statements to them. This document has been checked and approved by the attending provider.

## 2019-04-02 ENCOUNTER — Telehealth: Payer: Self-pay | Admitting: *Deleted

## 2019-04-02 NOTE — Telephone Encounter (Signed)
CALLED PATIENT TO REMIND OF HDR TX. FOR 04-03-19 @ 9 AM, SPOKE WITH PATIENT AND SHE IS AWARE OF THIS [place].

## 2019-04-02 NOTE — Progress Notes (Signed)
  Radiation Oncology         [phone]) [phone] ________________________________  Name: Erica [name] MRN: [id number]  Date: 04/03/2019  DOB: March 18, 1951  CC: [name], MD  [name], MD  HDR BRACHYTHERAPY NOTE  DIAGNOSIS: Stage IA grade 3 endometrioid endometrial cancer   Simple treatment device note: Patient had construction of her custom vaginal cylinder. She will be treated with a 2.5 cm diameter segmented cylinder. This conforms to her anatomy without undue discomfort.  Vaginal brachytherapy procedure node: The patient was brought to the [facility] suite. Identity was confirmed. All relevant records and images related to the planned course of therapy were reviewed. The patient freely provided informed written consent to proceed with treatment after reviewing the details related to the planned course of therapy. The consent form was witnessed and verified by the simulation staff. Then, the patient was set-up in a stable reproducible supine position for radiation therapy. Pelvic exam revealed the vaginal cuff to be intact . The patient's custom vaginal cylinder was placed in the proximal vagina. This was affixed to the CT/MR stabilization plate to prevent slippage. Patient tolerated the placement well.  Verification simulation note:  A fiducial marker was placed within the vaginal cylinder. An AP and lateral film was then obtained through the pelvis area. This documented accurate position of the vaginal cylinder for treatment.  HDR BRACHYTHERAPY TREATMENT  The remote afterloading device was affixed to the vaginal cylinder by catheter. Patient then proceeded to undergo her fifth high-dose-rate treatment directed at the proximal vagina. The patient was prescribed a dose of 6.0 gray to be delivered to the mucosal surface. Treatment length was 3.0 cm. Patient was treated with 1 channel using 7 dwell positions. Treatment time was 176.70 seconds. Iridium 192 was the high-dose-rate source for  treatment. The patient tolerated the treatment well. After completion of her therapy, a radiation survey was performed documenting return of the iridium source into the Gamma[employer] safe.   PLAN: The patient will return in one month for follow-up.  The patient did have some diarrhea after the fourth treatment and I recommended she get some Imodium if this occurs again otherwise routine follow-up in 1 month ________________________________  Blair Promise, PhD, MD   This document serves as a record of services personally performed by Gery Pray, MD. It was created on his behalf by Clerance Lav, a trained medical scribe. The creation of this record is based on the scribe's personal observations and the provider's statements to them. This document has been checked and approved by the attending provider.

## 2019-04-03 ENCOUNTER — Other Ambulatory Visit: Payer: Self-pay

## 2019-04-03 ENCOUNTER — Ambulatory Visit
Admission: RE | Admit: 2019-04-03 | Discharge: 2019-04-03 | Disposition: A | Discharge status: Home / Self Care | Payer: Medicare Other | Source: Ambulatory Visit | Attending: Radiation Oncology | Admitting: Radiation Oncology

## 2019-04-03 ENCOUNTER — Encounter: Payer: Self-pay | Admitting: Radiation Oncology

## 2019-04-03 DIAGNOSIS — C541 Malignant neoplasm of endometrium: Secondary | ICD-10-CM | POA: Diagnosis not present

## 2019-05-08 ENCOUNTER — Ambulatory Visit
Admission: RE | Admit: 2019-05-08 | Discharge: 2019-05-08 | Disposition: A | Discharge status: Home / Self Care | Payer: Medicare Other | Source: Ambulatory Visit | Attending: Radiation Oncology | Admitting: Radiation Oncology

## 2019-05-08 ENCOUNTER — Other Ambulatory Visit: Payer: Self-pay

## 2019-05-08 ENCOUNTER — Encounter: Payer: Self-pay | Admitting: Radiation Oncology

## 2019-05-08 VITALS — BP 148/74 | HR 72 | Temp 98.4°F | Resp 18 | Ht 62.0 in | Wt 137.5 lb

## 2019-05-08 DIAGNOSIS — R6881 Early satiety: Secondary | ICD-10-CM | POA: Diagnosis not present

## 2019-05-08 DIAGNOSIS — C541 Malignant neoplasm of endometrium: Secondary | ICD-10-CM | POA: Insufficient documentation

## 2019-05-08 DIAGNOSIS — Z923 Personal history of irradiation: Secondary | ICD-10-CM | POA: Diagnosis not present

## 2019-05-08 DIAGNOSIS — Z79899 Other long term (current) drug therapy: Secondary | ICD-10-CM | POA: Diagnosis not present

## 2019-05-08 NOTE — Progress Notes (Signed)
Radiation Oncology         (336) [phone] ________________________________  Name: Erica [name] MRN: [id number]  Date: 05/08/2019  DOB: March 01, 1951  Follow-Up Visit Note  CC: [name], MD  [name], MD    ICD-10-CM   1. Endometrial cancer (HCC)  C54.1     Diagnosis:   Stage IA grade 3 endometrioid endometrial cancer  Interval Since Last Radiation:  1 month   03/06/2019 through 04/03/2019 Site Technique Tx Length Total Dose (Gy) Dose per Fx (Gy) Completed Fx Beam Energies  Pelvis: Pelvis_v cuff HDR-brachy; 2.5 cm cylinder 3.0 cm 30/30 6 5/5 Ir-192    Narrative:  The patient returns today for routine follow-up.   On review of systems, the patient reports urinary "pressure," occasional gas, and early satiety. She also notes a few days of light vaginal discharge following her last treatment that has since resolved. She denies pain, dysuria or hematuria, vaginal bleeding, rectal bleeding, diarrhea or constipation, and nausea or vomiting.  ALLERGIES:  is allergic to gadolinium.  Meds: Current Outpatient Medications  Medication Sig Dispense Refill  . acetaminophen (TYLENOL) 500 MG tablet Take 500 mg by mouth every 6 (six) hours as needed for moderate pain.     [name] alendronate (FOSAMAX) 70 MG tablet Take 70 mg by mouth once a week.     . Calcium Carb-Cholecalciferol (CALCIUM 600+D3) 600-200 MG-UNIT TABS Take 1 tablet by mouth daily.    [name] etodolac (LODINE) 400 MG tablet Take 400 mg by mouth daily as needed for moderate pain.     . fluticasone (FLONASE) 50 MCG/ACT nasal spray Place 1 spray into both nostrils daily.    [name] loratadine (CLARITIN) 10 MG tablet Take 10 mg by mouth daily.    . Multiple Vitamin (MULTIVITAMIN) tablet Take 1 tablet by mouth daily.    [name] SYNTHROID 75 MCG tablet Take 75 mcg by mouth daily before breakfast.     . senna-docusate (SENOKOT-S) 8.6-50 MG tablet Take 2 tablets by mouth at bedtime. For AFTER surgery, do not take if having diarrhea (Patient  not taking: Reported on 05/08/2019) 30 tablet 0  . traMADol (ULTRAM) 50 MG tablet Take 1 tablet (50 mg total) by mouth every 6 (six) hours as needed. For AFTER surgery, do not take and drive (Patient not taking: Reported on 05/08/2019) 10 tablet 0   No current facility-administered medications for this encounter.    Physical Findings: The patient is in no acute distress. Patient is alert and oriented.  height is 5\' 2"  (1.575 m) and weight is 137 lb 8 oz (62.4 kg). Her temporal temperature is 98.4 F (36.9 C). Her blood pressure is 148/74 (abnormal) and her pulse is 72. Her respiration is 18 and oxygen saturation is 99%. .   Lungs are clear to auscultation bilaterally. Heart has regular rate and rhythm. No palpable cervical, supraclavicular, or axillary adenopathy. Abdomen soft, non-tender, normal bowel sounds. Pelvic exam deferred in light of recent treatment completion.  Lab Findings: Lab Results  Component Value Date   WBC 5.3 01/13/2019   HGB 10.8 (L) 01/13/2019   HCT 34.0 (L) 01/13/2019   MCV 105.3 (H) 01/13/2019   PLT 204 01/13/2019    Radiographic Findings: No results found.  Impression:  The patient is recovering from the effects of radiation.  She did not experience any significant side effects with her vaginal brachytherapy.  Plan: The patient will follow up with Dr. Denman George in approximately 2 months.  She will return for follow-up in radiation  oncology in 5 months.  Today the patient was given a vaginal dilator and instructions on its use.  ____________________________________ Gery Pray, MD   This document serves as a record of services personally performed by Gery Pray, MD. It was created on his behalf by Wilburn Mylar, a trained medical scribe. The creation of this record is based on the scribe's personal observations and the provider's statements to them. This document has been checked and approved by the attending provider.

## 2019-05-08 NOTE — Progress Notes (Signed)
Pt presents today for f/u with Dr. Sondra Come. Pt denies c/o pain. Pt denies fatigue. Pt reports urinary "pressure", denies dysuria/hematuria. Pt denies vaginal bleeding/discharge. Pt reports a few days of light vaginal discharge after last treatment, has since resolved. Pt denies rectal bleeding, diarrhea/constipation. Pt reports occasional gas and early satiety. Pt denies N/V. Vaginal dilator teaching provided with fair understanding.  BP (!) 148/74 (BP Location: Left Arm, Patient Position: Sitting)   Pulse 72   Temp 98.4 F (36.9 C) (Temporal)   Resp 18   Ht 5\' 2"  (1.575 m)   Wt 137 lb 8 oz (62.4 kg)   SpO2 99%   BMI 25.15 kg/m   Wt Readings from Last 3 Encounters:  05/08/19 137 lb 8 oz (62.4 kg)  03/06/19 138 lb 6 oz (62.8 kg)  02/10/19 141 lb 9.6 oz (64.2 kg)   Loma Sousa, RN BSN    Home Care Instructions for the Insertion and Care of Your Vaginal Dilator  Why Do I Need a Vaginal Dilator?  Internal radiation therapy may cause scar tissue to form at the top of your vagina (vaginal cuff).  This may make vaginal examinations difficult in the future. You can prevent scar tissue from forming by using a vaginal dilator (a smooth plastic rod), and/or by having regular sexual intercourse.  If not using the dilator you should be having intercourse two or three times a week.  If you are unable to have intercourse, you should use your vaginal dilator.  You may have some spotting or bleeding from your dilator or intercourse the first few times. You may also have some discomfort. If discomfort occurs with intercourse, you and your partner may need to stop for a while and try again later.  How to Use Your Vaginal Dilator  - Wash the dilator with soap and water before and after each use. - Check the dilator to be sure it is smooth. Do not use the dilator if you find any roughspots. - Coat the dilator with K-Y Jelly, Astroglide, or Replens. Do not use Vaseline, baby oil, or other  oil based lubricants. They are not water-soluble and can be irritating to the tissues in the vagina. - Lie on your back with your knees bent and legs apart. - Insert the rounded end of the dilator into your vagina as far as it will go without causing pain or discomfort. - Close your knees and slowly straighten your legs. - Keep the dilator in your vagina for about 10 to 15 minutes.  Please use 3 times a week, for example: Monday, Wednesday and Friday evenings. [facility] your knees, open your legs, and gently remove the dilator. - Gently cleanse the skin around the vaginal opening. - Wash the dilator after each use. -  It is important that you use the dilator routinely until instructed otherwise by your doctor.

## 2019-05-08 NOTE — Progress Notes (Incomplete)
°  Patient Name: Erica [name] MRN: [id number] DOB: Jul 31, 1950 Referring Physician: [name] (Profile Not Attached) Date of Service: 04/03/2019 [facility] [place]                                                        End Of Treatment Note  Diagnoses: C54.1-Malignant neoplasm of endometrium  Cancer Staging: Stage IA grade 3 endometrioid endometrial cancer  Intent: Curative  Radiation Treatment Dates: 03/06/2019 through 04/03/2019 Site Technique Tx Length Total Dose (Gy) Dose per Fx (Gy) Completed Fx Beam Energies  Pelvis: Pelvis_v cuff HDR-brachy; 2.5 cm cylinder 3.0 cm 30/30 6 5/5 Ir-192   Narrative: The patient tolerated radiation therapy relatively well. She reported diarrhea after her 4th dose. She was otherwise without acute side effects.  Plan: The patient will follow-up with radiation oncology in 1 month.  ________________________________________________   Blair Promise, PhD, MD  This document serves as a record of services personally performed by Gery Pray, MD. It was created on his behalf by Wilburn Mylar, a trained medical scribe. The creation of this record is based on the scribe's personal observations and the provider's statements to them. This document has been checked and approved by the attending provider.

## 2019-05-08 NOTE — Patient Instructions (Signed)
Home Care Instructions for the Insertion and Care of Your Vaginal Dilator  Why Do I Need a Vaginal Dilator?  Internal radiation therapy may cause scar tissue to form at the top of your vagina (vaginal cuff).  This may make vaginal examinations difficult in the future. You can prevent scar tissue from forming by using a vaginal dilator (a smooth plastic rod), and/or by having regular sexual intercourse.  If not using the dilator you should be having intercourse two or three times a week.  If you are unable to have intercourse, you should use your vaginal dilator.  You may have some spotting or bleeding from your dilator or intercourse the first few times. You may also have some discomfort. If discomfort occurs with intercourse, you and your partner may need to stop for a while and try again later.  How to Use Your Vaginal Dilator  - Wash the dilator with soap and water before and after each use. - Check the dilator to be sure it is smooth. Do not use the dilator if you find any roughspots. - Coat the dilator with K-Y Jelly, Astroglide, or Replens. Do not use Vaseline, baby oil, or other oil based lubricants. They are not water-soluble and can be irritating to the tissues in the vagina. - Lie on your back with your knees bent and legs apart. - Insert the rounded end of the dilator into your vagina as far as it will go without causing pain or discomfort. - Close your knees and slowly straighten your legs. - Keep the dilator in your vagina for about 10 to 15 minutes.  Please use 3 times a week, for example: Monday, Wednesday and Friday evenings. - Bend your knees, open your legs, and gently remove the dilator. - Gently cleanse the skin around the vaginal opening. - Wash the dilator after each use. -  It is important that you use the dilator routinely until instructed otherwise by your doctor.   Coronavirus (COVID-19) Are you at risk?  Are you at risk for the Coronavirus  (COVID-19)?  To be considered HIGH RISK for Coronavirus (COVID-19), you have to meet the following criteria:  . Traveled to [place], [place], [place], [place] or [place]; or in the [place] to [place], [place], [place], or [place]; and have fever, cough, and shortness of breath within the last 2 weeks of travel OR . Been in close contact with a person diagnosed with COVID-19 within the last 2 weeks and have fever, cough, and shortness of breath . IF YOU DO NOT MEET THESE CRITERIA, YOU ARE CONSIDERED LOW RISK FOR COVID-19.  What to do if you are HIGH RISK for COVID-19?  . If you are having a medical emergency, call 911. . Seek medical care right away. Before you go to a doctor's office, urgent care or emergency department, call ahead and tell them about your recent travel, contact with someone diagnosed with COVID-19, and your symptoms. You should receive instructions from your physician's office regarding next steps of care.  . When you arrive at healthcare provider, tell the healthcare staff immediately you have returned from visiting [place], [place], [place], [place] or [place]; or traveled in the [place] to [place], [place], [place], or [place]; in the last two weeks or you have been in close contact with a person diagnosed with COVID-19 in the last 2 weeks.   . Tell the health care staff about your symptoms: fever, cough and shortness of breath. .   After you have been seen by a medical provider, you will be either: o Tested for (COVID-19) and discharged home on quarantine except to seek medical care if symptoms worsen, and asked to  - Stay home and avoid contact with others until you get your results (4-5 days)  - Avoid travel on public transportation if possible (such as bus, train, or airplane) or o Sent to the Emergency Department by EMS for evaluation, COVID-19 testing, and possible admission depending on your condition and test results.  What to do if you are LOW  RISK for COVID-19?  Reduce your risk of any infection by using the same precautions used for avoiding the common cold or flu:  . Wash your hands often with soap and warm water for at least 20 seconds.  If soap and water are not readily available, use an alcohol-based hand sanitizer with at least 60% alcohol.  . If coughing or sneezing, cover your mouth and nose by coughing or sneezing into the elbow areas of your shirt or coat, into a tissue or into your sleeve (not your hands). . Avoid shaking hands with others and consider head nods or verbal greetings only. . Avoid touching your eyes, nose, or mouth with unwashed hands.  . Avoid close contact with people who are sick. . Avoid places or events with large numbers of people in one location, like concerts or sporting events. . Carefully consider travel plans you have or are making. . If you are planning any travel outside or inside the [place], visit the CDC's Travelers' Health webpage for the latest health notices. . If you have some symptoms but not all symptoms, continue to monitor at home and seek medical attention if your symptoms worsen. . If you are having a medical emergency, call 911.   ADDITIONAL HEALTHCARE OPTIONS FOR PATIENTS  [facility] / e-Visit: [email or web address]         [facility]: [phone]  [facility]: [phone]                   [facility]: [phone]   

## 2019-06-03 ENCOUNTER — Telehealth: Payer: Self-pay | Admitting: *Deleted

## 2019-06-03 NOTE — Telephone Encounter (Signed)
CALLED PATIENT TO INFORM OF FU WITH DR. Denman George ON 07-11-19- ARRIVAL TIME- 2:30 PM AND HER FU WITH DR. KINARD ON 10-06-19 @ 10:30 AM, SPOKE WITH PATIENT AND SHE IS AWARE OF THESE APPTS.

## 2019-06-03 NOTE — Telephone Encounter (Signed)
Per Enid Derry in radiation I have scheduled an appt for 2/19. She will contact the patient

## 2019-07-09 ENCOUNTER — Telehealth: Payer: Self-pay | Admitting: *Deleted

## 2019-07-09 NOTE — Telephone Encounter (Signed)
Return patient's call and rescheduled the appt from Friday to March 2rd

## 2019-07-11 ENCOUNTER — Ambulatory Visit: Payer: Medicare Other | Admitting: Gynecologic Oncology

## 2019-07-22 ENCOUNTER — Inpatient Hospital Stay: Payer: Medicare Other | Attending: Gynecologic Oncology | Admitting: Gynecologic Oncology

## 2019-07-22 ENCOUNTER — Other Ambulatory Visit: Payer: Self-pay

## 2019-07-22 ENCOUNTER — Encounter: Payer: Self-pay | Admitting: Gynecologic Oncology

## 2019-07-22 VITALS — BP 148/66 | HR 84 | Temp 98.0°F | Resp 16 | Ht 62.0 in | Wt 130.8 lb

## 2019-07-22 DIAGNOSIS — Z8542 Personal history of malignant neoplasm of other parts of uterus: Secondary | ICD-10-CM | POA: Diagnosis not present

## 2019-07-22 DIAGNOSIS — C541 Malignant neoplasm of endometrium: Secondary | ICD-10-CM

## 2019-07-22 DIAGNOSIS — Z08 Encounter for follow-up examination after completed treatment for malignant neoplasm: Secondary | ICD-10-CM | POA: Diagnosis present

## 2019-07-22 DIAGNOSIS — Z923 Personal history of irradiation: Secondary | ICD-10-CM

## 2019-07-22 DIAGNOSIS — Z853 Personal history of malignant neoplasm of breast: Secondary | ICD-10-CM | POA: Insufficient documentation

## 2019-07-22 DIAGNOSIS — E039 Hypothyroidism, unspecified: Secondary | ICD-10-CM | POA: Insufficient documentation

## 2019-07-22 DIAGNOSIS — Z79899 Other long term (current) drug therapy: Secondary | ICD-10-CM | POA: Diagnosis not present

## 2019-07-22 DIAGNOSIS — Z90710 Acquired absence of both cervix and uterus: Secondary | ICD-10-CM | POA: Diagnosis not present

## 2019-07-22 DIAGNOSIS — Z90722 Acquired absence of ovaries, bilateral: Secondary | ICD-10-CM | POA: Diagnosis not present

## 2019-07-22 DIAGNOSIS — M1990 Unspecified osteoarthritis, unspecified site: Secondary | ICD-10-CM | POA: Diagnosis not present

## 2019-07-22 DIAGNOSIS — M8580 Other specified disorders of bone density and structure, unspecified site: Secondary | ICD-10-CM | POA: Diagnosis not present

## 2019-07-22 NOTE — Patient Instructions (Signed)
Please notify Dr Denman George at phone number [phone] if you notice vaginal bleeding, new pelvic or abdominal pains, bloating, feeling full easy, or a change in bladder or bowel function.   Please have Dr Clabe Seal nurses contact Dr Serita Grit office (at [phone]) in May, 2021 after your appointment with him to request an appointment with her for August, 2021.

## 2019-07-22 NOTE — Progress Notes (Signed)
Gynecologic Oncology Follow-up Note    Chief Complaint:  Chief Complaint  Patient presents with  . Endometrial cancer ([place])    Follow up    Assessment/Plan: [name] is a 69 y.o with stage IA grade 3 endometrioid endometrial cancer. MSI stable/MMR normal.  High/intermediate risk factors for recurrence. s/p vaginal brachytherapy completed 04/03/19.  Disease status: no evidence of disease recurrence.  I recommend follow-up every 3 months for 2 years (alternating between Dr Sondra Come and myself) and after that time we will transition to q 6 monthly until November, 2025.  Survivorship care plan given.   HPI: [name] is a 69 y.o.  G5 P3 with a history of right breast cancer who received adjuvant tamoxifen for 10 years, 2000 62,016.[name]  She reports vaginal spotting with pain that began on December 07, 2018.  A transvaginal ultrasound was notable for a uterus measuring 8.3 x 4.4 x 5.1 cm with an endometrial stripe of 1.53 cm.  Neither ovary was visualized.  An endometrial biopsy was obtained on December 26, 2018 and returned with a grade 3 endometrioid adenocarcinoma.    The patient denies nausea vomiting or changes in bowel or bladder habits.  She reports longstanding bladder urgency and a good appetite and there is been no recent weight loss.  She states that she is very active and involved in church activities and manages her own activities of daily living.  Her family history is notable for sister with breast cancer diagnosed in the 25s.  On 01/16/19 she underwent robotic assisted total hysterectomy, BSO, SLN biopsy. Surgery was uncomplicated. Final pathology revealed a FIGO grade 3 endometrioid endometrial adenocarcinoma. There was 38m of 235mmyometrial invasion (inner half) with no LVSI, no cervical or adnexal involvement and negative bilateral SLN's.MSI pending.  She was diagnosed with stage IA grade 3 endometrioid adenocarcinoma and, in accordance with NCCN guidelines,  vaginal brachytherapy was recommended.  Interval Hx:  She received vaginal brachytherapy with Dr.Kinard between the dates October 15 through 04/03/2019.  She received a total of 30 Gray in 5 fractions of 6 Gray.  She tolerated treatments well with no toxicities or delays.  Today she returned for survivorship and surveillance visit.  She reported no concerns or questions.  She denied using the vaginal dilator.  Review of Systems:  Constitutional  Feels well,   Cardiovascular  No chest pain, shortness of breath, or edema  Pulmonary  No cough or wheeze.  Gastro Intestinal  No nausea, vomitting, or diarrhoea. No bright red blood per rectum, no abdominal pain, change in bowel movement, or constipation.  Genito Urinary  No frequency, urgency, dysuria, urge incontinence occasional vaginal spotting musculo Skeletal  No myalgia, arthralgia, joint swelling or pain  Neurologic  No weakness, numbness, change in gait,  Psychology  No depression, anxiety, insomnia.    Current Meds:  Outpatient Encounter Medications as of 07/22/2019  Medication Sig  . acetaminophen (TYLENOL) 500 MG tablet Take 500 mg by mouth every 6 (six) hours as needed for moderate pain.   . Marland Kitchenlendronate (FOSAMAX) 70 MG tablet Take 70 mg by mouth once a week.   . Calcium Carb-Cholecalciferol (CALCIUM 600+D3) 600-200 MG-UNIT TABS Take 1 tablet by mouth daily.  . Marland Kitchentodolac (LODINE) 400 MG tablet Take 400 mg by mouth daily as needed for moderate pain.   . fluticasone (FLONASE) 50 MCG/ACT nasal spray Place 1 spray into both nostrils daily.  . Marland Kitchenoratadine (CLARITIN) 10 MG tablet Take 10 mg by mouth daily.  .[name]  Multiple Vitamin (MULTIVITAMIN) tablet Take 1 tablet by mouth daily.  [name] senna-docusate (SENOKOT-S) 8.6-50 MG tablet Take 2 tablets by mouth at bedtime. For AFTER surgery, do not take if having diarrhea  . SYNTHROID 75 MCG tablet Take 75 mcg by mouth daily before breakfast.   . traMADol (ULTRAM) 50 MG tablet Take 1 tablet (50 mg  total) by mouth every 6 (six) hours as needed. For AFTER surgery, do not take and drive   No facility-administered encounter medications on file as of 07/22/2019.    Allergy:  Allergies  Allergen Reactions  . Gadolinium Nausea And Vomiting     Desc: Pt developed nausea and vomiting after receiving 17cc Multihance. Came back for repeat study and did fine with Magnevist. [name], Onset Date: [id number]     Social Hx:   Social History   Socioeconomic History  . Marital status: Married    Spouse name: Not on file  . Number of children: Not on file  . Years of education: Not on file  . Highest education level: Not on file  Occupational History  . Not on file  Tobacco Use  . Smoking status: Never Smoker  . Smokeless tobacco: Never Used  Substance and Sexual Activity  . Alcohol use: Never  . Drug use: Never  . Sexual activity: Yes  Other Topics Concern  . Not on file  Social History Narrative  . Not on file   Social Determinants of Health   Financial Resource Strain:   . Difficulty of Paying Living Expenses: Not on file  Food Insecurity:   . Worried About Charity fundraiser in the Last Year: Not on file  . Ran Out of Food in the Last Year: Not on file  Transportation Needs:   . Lack of Transportation (Medical): Not on file  . Lack of Transportation (Non-Medical): Not on file  Physical Activity:   . Days of Exercise per Week: Not on file  . Minutes of Exercise per Session: Not on file  Stress:   . Feeling of Stress : Not on file  Social Connections:   . Frequency of Communication with Friends and Family: Not on file  . Frequency of Social Gatherings with Friends and Family: Not on file  . Attends Religious Services: Not on file  . Active Member of Clubs or Organizations: Not on file  . Attends Archivist Meetings: Not on file  . Marital Status: Not on file  Intimate Partner Violence:   . Fear of Current or Ex-Partner: Not on file  . Emotionally Abused:  Not on file  . Physically Abused: Not on file  . Sexually Abused: Not on file    Past Surgical Hx:  Past Surgical History:  Procedure Laterality Date  . BREAST LUMPECTOMY Right 2006   With Lymphnode discetion  . NM I-131 ABLATION W/THYROGEN ([place] [facility])  1970  . OTHER SURGICAL HISTORY  1991   HNP with Fusion   . ROBOTIC ASSISTED TOTAL HYSTERECTOMY WITH BILATERAL SALPINGO OOPHERECTOMY Bilateral 01/16/2019   Procedure: XI ROBOTIC ASSISTED TOTAL HYSTERECTOMY WITH BILATERAL SALPINGO OOPHORECTOMY;  Surgeon: Everitt Amber, MD;  Location: [facility];  Service: Gynecology;  Laterality: Bilateral;  . SENTINEL NODE BIOPSY N/A 01/16/2019   Procedure: SENTINEL LYMPH NODE BIOPSY;  Surgeon: Everitt Amber, MD;  Location: [facility];  Service: Gynecology;  Laterality: N/A;  . TUBAL LIGATION Bilateral 1975    Past Medical Hx:  Past Medical History:  Diagnosis Date  . Allergy   .  Arthritis   . Breast cancer ([place])    right/2006  . Osteoarthritis   . Osteopenia   . Thyroid disease    Hypothyroidism  Patient is unaware of the stage of the breast cancer but states that she received lumpectomy sentinel lymph node dissection adjuvant radiation and chemotherapy followed by tamoxifen treatment for approximately 10 years  Past Gynecological History: Gravida 5 para 3 menopause occurred in the 41s denies any history of abnormal Pap and denies use of any hormonal agents for birth control  family Hx:  Family History  Problem Relation Age of Onset  . Hypertension Mother   . Heart disease Mother   . Cirrhosis Father   . Colon cancer Neg Hx   . Liver disease Neg Hx   . Colon polyps Neg Hx     Vitals:  Blood pressure (!) 148/66, pulse 84, temperature 98 F (36.7 C), temperature source Temporal, resp. rate 16, height 5' 2"  (1.575 m), weight 130 lb 12.8 oz (59.3 kg), SpO2 100 %.  Physical Exam: WD in NAD Neck  Supple NROM, without any enlargements.  Lymph Node Survey No cervical supraclavicular or inguinal  adenopathy Cardiovascular  Pulse normal rate, regularity and rhythm. S1 and S2 normal.  Lungs  Clear to auscultation bilateraly, without wheezes/crackles/rhonchi. Good air movement.  Skin  No rash/lesions/breakdown  Psychiatry  Alert and oriented to person, place, and time  Abdomen  Normoactive bowel sounds, abdomen soft, non-tender without evidence of hernia. Well healed incisions Back No CVA tenderness Genito Urinary  Vulva/vagina: Normal external female genitalia.  No lesions. No discharge or bleeding.  Bladder/urethra:  No lesions or masses, well supported bladder. Distal urethral diverticulum present.  Vaginal cuff smooth, no blood, no lesions  Adnexa: no palpable masses. Rectal  deferred Extremities  No bilateral cyanosis, clubbing or edema.  Thereasa Solo, MD, 07/22/2019, 3:47 PM

## 2019-10-06 ENCOUNTER — Ambulatory Visit: Payer: Self-pay | Admitting: Radiation Oncology

## 2019-10-08 NOTE — Progress Notes (Signed)
Patient here for a 5 month f/u visit with Dr. Sondra Come.  Pain: 0  Fatigue: none  Nausea, vomiting or diarrhea: None  Bladder Issues: occasional dysuria if waits too long to empty her bladder  Vaginal or rectal bleeding: None  Skin irritation: None  BP (!) 138/54 (BP Location: Right Arm, Patient Position: Sitting, Cuff Size: Normal)   Pulse (!) 103   Temp 97.7 F (36.5 C)   Resp 20   Wt 128 lb (58.1 kg)   SpO2 99%   BMI 23.41 kg/m    Wt Readings from Last 3 Encounters:  10/09/19 128 lb (58.1 kg)  07/22/19 130 lb 12.8 oz (59.3 kg)  05/08/19 137 lb 8 oz (62.4 kg)

## 2019-10-09 ENCOUNTER — Encounter: Payer: Self-pay | Admitting: Radiation Oncology

## 2019-10-09 ENCOUNTER — Ambulatory Visit
Admission: RE | Admit: 2019-10-09 | Discharge: 2019-10-09 | Disposition: A | Discharge status: Home / Self Care | Payer: Medicare Other | Source: Ambulatory Visit | Attending: Radiation Oncology | Admitting: Radiation Oncology

## 2019-10-09 ENCOUNTER — Other Ambulatory Visit: Payer: Self-pay

## 2019-10-09 DIAGNOSIS — Z8542 Personal history of malignant neoplasm of other parts of uterus: Secondary | ICD-10-CM | POA: Diagnosis present

## 2019-10-09 DIAGNOSIS — Z79899 Other long term (current) drug therapy: Secondary | ICD-10-CM | POA: Diagnosis not present

## 2019-10-09 DIAGNOSIS — Z923 Personal history of irradiation: Secondary | ICD-10-CM | POA: Insufficient documentation

## 2019-10-09 DIAGNOSIS — C541 Malignant neoplasm of endometrium: Secondary | ICD-10-CM

## 2019-10-09 NOTE — Progress Notes (Signed)
Radiation Oncology         [phone] ________________________________  Name: Erica [name] MRN: [id number]  Date: 10/09/2019  DOB: 1951-05-10  Follow-Up Visit Note  CC: [name], MD  [name], MD    ICD-10-CM   1. Endometrial cancer ([facility])  C54.1     Diagnosis: Stage IA grade 3 endometrioid endometrial cancer  Interval Since Last Radiation: Six months, one week, and one day.  03/06/2019 through 04/03/2019 Site Technique Tx Length Total Dose (Gy) Dose per Fx (Gy) Completed Fx Beam Energies  Pelvis: Pelvis_v cuff HDR-brachy; 2.5 cm cylinder 3.0 cm 30/30 6 5/5 Ir-192    Narrative:  The patient returns today for routine follow-up. She was last seen by Dr. Denman George, during which time there was no evidence of disease recurrence.   On review of systems, the patient reports no pelvic pain or abdominal bloating.  She denies any vaginal bleeding. She denies hematuria or rectal bleeding.  She denies any problems with her bowels.  Has been inconsistent with using her vaginal dilator and I recommended she resume this procedure.  ALLERGIES:  is allergic to gadolinium.  Meds: Current Outpatient Medications  Medication Sig Dispense Refill  . acetaminophen (TYLENOL) 500 MG tablet Take 500 mg by mouth every 6 (six) hours as needed for moderate pain.     [name] alendronate (FOSAMAX) 70 MG tablet Take 70 mg by mouth once a week.     . Calcium Carb-Cholecalciferol (CALCIUM 600+D3) 600-200 MG-UNIT TABS Take 1 tablet by mouth daily.    [name] etodolac (LODINE) 400 MG tablet Take 400 mg by mouth daily as needed for moderate pain.     . fluticasone (FLONASE) 50 MCG/ACT nasal spray Place 1 spray into both nostrils daily.    [name] loratadine (CLARITIN) 10 MG tablet Take 10 mg by mouth daily.    . Multiple Vitamin (MULTIVITAMIN) tablet Take 1 tablet by mouth daily.    [name] SYNTHROID 75 MCG tablet Take 75 mcg by mouth daily before breakfast.     . senna-docusate (SENOKOT-S) 8.6-50 MG tablet Take 2  tablets by mouth at bedtime. For AFTER surgery, do not take if having diarrhea (Patient not taking: Reported on 10/09/2019) 30 tablet 0  . traMADol (ULTRAM) 50 MG tablet Take 1 tablet (50 mg total) by mouth every 6 (six) hours as needed. For AFTER surgery, do not take and drive 10 tablet 0   No current facility-administered medications for this encounter.    Physical Findings: The patient is in no acute distress. Patient is alert and oriented.  weight is 128 lb (58.1 kg). Her temperature is 97.7 F (36.5 C). Her blood pressure is 138/54 (abnormal) and her pulse is 103 (abnormal). Her respiration is 20 and oxygen saturation is 99%.   Lungs are clear to auscultation bilaterally. Heart has regular rate and rhythm. No palpable cervical, supraclavicular, or axillary adenopathy. Abdomen soft, non-tender, normal bowel sounds. On pelvic examination the external genitalia were unremarkable. A speculum exam was performed. There are no mucosal lesions noted in the vaginal vault. On bimanual l examination there were no pelvic masses appreciated.  Vaginal cuff intact  Lab Findings: Lab Results  Component Value Date   WBC 5.3 01/13/2019   HGB 10.8 (L) 01/13/2019   HCT 34.0 (L) 01/13/2019   MCV 105.3 (H) 01/13/2019   PLT 204 01/13/2019    Radiographic Findings: No results found.  Impression: Stage IA grade 3 endometrioid endometrial cancer  No clinical evidence of recurrence.  Recommended  she resume using the vaginal dilator  Plan: The patient will follow up with Dr. Denman George in three months and with radiation oncology in six months.  _________________________________   Blair Promise, PhD, MD  This document serves as a record of services personally performed by Gery Pray, MD. It was created on his behalf by Clerance Lav, a trained medical scribe. The creation of this record is based on the scribe's personal observations and the provider's statements to them. This document has been checked and  approved by the attending provider.

## 2019-10-10 ENCOUNTER — Telehealth: Payer: Self-pay | Admitting: *Deleted

## 2019-10-10 NOTE — Telephone Encounter (Signed)
CALLED PATIENT TO INFORM OF FU WITH DR,. KINARD ON 04-12-20 @ 3 PM, NO ANSWER UNABLE TO LEAVE MESSAGE, MAILED APPT. CARD

## 2019-11-04 ENCOUNTER — Telehealth: Payer: Self-pay | Admitting: *Deleted

## 2019-11-04 NOTE — Telephone Encounter (Signed)
CALLED PATIENT TO INFORM OF FU APPT. WITH DR. Denman George ON 12/08/19 - ARRIVAL TIME- 2 PM, UNABLE TO LVM, MAILED APPT. CARD

## 2019-12-08 ENCOUNTER — Encounter: Payer: Self-pay | Admitting: Gynecologic Oncology

## 2019-12-08 ENCOUNTER — Inpatient Hospital Stay: Payer: Medicare Other

## 2019-12-08 ENCOUNTER — Other Ambulatory Visit: Payer: Self-pay

## 2019-12-08 ENCOUNTER — Inpatient Hospital Stay: Payer: Medicare Other | Attending: Gynecologic Oncology | Admitting: Gynecologic Oncology

## 2019-12-08 VITALS — BP 171/67 | HR 71 | Temp 98.5°F | Resp 16 | Ht 62.0 in | Wt 129.8 lb

## 2019-12-08 DIAGNOSIS — Z79899 Other long term (current) drug therapy: Secondary | ICD-10-CM | POA: Diagnosis not present

## 2019-12-08 DIAGNOSIS — E039 Hypothyroidism, unspecified: Secondary | ICD-10-CM | POA: Diagnosis not present

## 2019-12-08 DIAGNOSIS — M1990 Unspecified osteoarthritis, unspecified site: Secondary | ICD-10-CM | POA: Diagnosis not present

## 2019-12-08 DIAGNOSIS — R3915 Urgency of urination: Secondary | ICD-10-CM | POA: Diagnosis not present

## 2019-12-08 DIAGNOSIS — M8580 Other specified disorders of bone density and structure, unspecified site: Secondary | ICD-10-CM | POA: Insufficient documentation

## 2019-12-08 DIAGNOSIS — R1900 Intra-abdominal and pelvic swelling, mass and lump, unspecified site: Secondary | ICD-10-CM

## 2019-12-08 DIAGNOSIS — Z90722 Acquired absence of ovaries, bilateral: Secondary | ICD-10-CM | POA: Diagnosis not present

## 2019-12-08 DIAGNOSIS — R102 Pelvic and perineal pain: Secondary | ICD-10-CM | POA: Insufficient documentation

## 2019-12-08 DIAGNOSIS — Z923 Personal history of irradiation: Secondary | ICD-10-CM | POA: Diagnosis not present

## 2019-12-08 DIAGNOSIS — Z853 Personal history of malignant neoplasm of breast: Secondary | ICD-10-CM | POA: Diagnosis not present

## 2019-12-08 DIAGNOSIS — C541 Malignant neoplasm of endometrium: Secondary | ICD-10-CM | POA: Diagnosis not present

## 2019-12-08 DIAGNOSIS — M25559 Pain in unspecified hip: Secondary | ICD-10-CM

## 2019-12-08 DIAGNOSIS — Z90710 Acquired absence of both cervix and uterus: Secondary | ICD-10-CM | POA: Insufficient documentation

## 2019-12-08 LAB — COMPREHENSIVE METABOLIC PANEL
ALT: 8 U/L (ref 0–44)
AST: 21 U/L (ref 15–41)
Albumin: 3.8 g/dL (ref 3.5–5.0)
Alkaline Phosphatase: 60 U/L (ref 38–126)
Anion gap: 9 (ref 5–15)
BUN: 7 mg/dL — ABNORMAL LOW (ref 8–23)
CO2: 28 mmol/L (ref 22–32)
Calcium: 10 mg/dL (ref 8.9–10.3)
Chloride: 102 mmol/L (ref 98–111)
Creatinine, Ser: 0.74 mg/dL (ref 0.44–1.00)
GFR calc Af Amer: >60 mL/min (ref >60)
GFR calc non Af Amer: >60 mL/min (ref >60)
Glucose, Bld: 86 mg/dL (ref 70–99)
Potassium: 3.7 mmol/L (ref 3.5–5.1)
Sodium: 139 mmol/L (ref 135–145)
Total Bilirubin: 0.4 mg/dL (ref 0.3–1.2)
Total Protein: 8 g/dL (ref 6.5–8.1)

## 2019-12-08 LAB — CBC WITH DIFFERENTIAL (CANCER CENTER ONLY)
Abs Immature Granulocytes: 0.02 10*3/uL (ref 0.00–0.07)
Basophils Absolute: 0 10*3/uL (ref 0.0–0.1)
Basophils Relative: 0 %
Eosinophils Absolute: 0.1 10*3/uL (ref 0.0–0.5)
Eosinophils Relative: 1 %
HCT: 33.2 % — ABNORMAL LOW (ref 36.0–46.0)
Hemoglobin: 10.7 g/dL — ABNORMAL LOW (ref 12.0–15.0)
Immature Granulocytes: 0 %
Lymphocytes Relative: 19 %
Lymphs Abs: 1.3 10*3/uL (ref 0.7–4.0)
MCH: 33.8 pg (ref 26.0–34.0)
MCHC: 32.2 g/dL (ref 30.0–36.0)
MCV: 104.7 fL — ABNORMAL HIGH (ref 80.0–100.0)
Monocytes Absolute: 0.6 10*3/uL (ref 0.1–1.0)
Monocytes Relative: 8 %
Neutro Abs: 4.9 10*3/uL (ref 1.7–7.7)
Neutrophils Relative %: 72 %
Platelet Count: 287 10*3/uL (ref 150–400)
RBC: 3.17 MIL/uL — ABNORMAL LOW (ref 3.87–5.11)
RDW: 11.9 % (ref 11.5–15.5)
WBC Count: 6.8 10*3/uL (ref 4.0–10.5)
nRBC: 0 % (ref 0.0–0.2)

## 2019-12-08 MED ORDER — OXYCODONE HCL 5 MG PO TABS: 5.0000 mg | ORAL_TABLET | ORAL | 0 refills | Status: DC | PRN

## 2019-12-08 MED ORDER — SENNOSIDES-DOCUSATE SODIUM 8.6-50 MG PO TABS: 2.0000 | ORAL_TABLET | Freq: Every day | ORAL | 0 refills | Status: DC

## 2019-12-08 NOTE — Patient Instructions (Addendum)
On examination there were concerning findings for possible regrowth of the cancer in the pelvis. Therefore Dr Denman George is checking some lab work today. She is also ordering a CT scan of the abdomen and pelvis to evaluate for any masses.  Her office will set up a phone visit encounter with you after the CT scan to discuss the results. Have your daughter available at that visit if you would like to hear the results and the plan.  Dr Denman George has prescribed pain medication for you. Please take sennakot once a day while taking this to prevent constipation.   You can take this pain medication with tylenol or ibuprofen (it will not interact with either).

## 2019-12-08 NOTE — Progress Notes (Signed)
Gynecologic Oncology Follow-up Note    Chief Complaint:  Chief Complaint  Patient presents with  . Endometrial cancer [facility])    Assessment/Plan: [name] is a 69 y.o with stage IA grade 3 endometrioid endometrial cancer. MSI stable/MMR normal. High/intermediate risk factors for recurrence. s/p vaginal brachytherapy completed 04/03/19.  Disease status: symptoms and exam findings concerning for recurrence on 12/08/19.  Recommend check UA and labs today. Recommend CT ab/pelvis to evaluate for abdomino pelvic recurrence. Phone visit to discuss results and plan salvage therapy (likely with chemotherapy) if recurrence confirmed (+/- biopsy).  Will order oxycodone and sennakot for her pain.   HPI: [name] is a 69 y.o.  G5 P3 with a history of right breast cancer who received adjuvant tamoxifen for 10 years, 2000 62,016.[name]  She reports vaginal spotting with pain that began on December 07, 2018.  A transvaginal ultrasound was notable for a uterus measuring 8.3 x 4.4 x 5.1 cm with an endometrial stripe of 1.53 cm.  Neither ovary was visualized.  An endometrial biopsy was obtained on December 26, 2018 and returned with a grade 3 endometrioid adenocarcinoma.    The patient denies nausea vomiting or changes in bowel or bladder habits.  She reports longstanding bladder urgency and a good appetite and there is been no recent weight loss.  She states that she is very active and involved in church activities and manages her own activities of daily living.  Her family history is notable for sister with breast cancer diagnosed in the 32s.  On 01/16/19 she underwent robotic assisted total hysterectomy, BSO, SLN biopsy. Surgery was uncomplicated. Final pathology revealed a FIGO grade 3 endometrioid endometrial adenocarcinoma. There was 59m of 25mmyometrial invasion (inner half) with no LVSI, no cervical or adnexal involvement and negative bilateral SLN's. MSI stable/MMR normal.   She was  diagnosed with stage IA grade 3 endometrioid adenocarcinoma and, in accordance with NCCN guidelines, vaginal brachytherapy was recommended. She received vaginal brachytherapy with Dr.Kinard between the dates October 15 through 04/03/2019.  She received a total of 30 Gray in 5 fractions of 6 Gray.  She tolerated treatments well with no toxicities or delays.  Interval Hx:   The patient presented today for routine scheduled surveillance but reported severe pelvic pain since June 2021.  She reported that these are worse with micturition and passage of bowel movements.  She denied constipation.  She denied difficulty voiding urine.  She denied fevers.  She denied blood in the urine, vaginally or stools.  She denied weight loss or difficulty eating.  Her symptoms particularly her pain was severe.  She did endorse left lower extremity edema since June 2021.  Review of Systems:  Constitutional  Feels well,   Cardiovascular  No chest pain, shortness of breath, or edema  Pulmonary  No cough or wheeze.  Gastro Intestinal  + pain with BM Genito Urinary  + pain with micturition, pelvic pain + pelvic pain Neurologic  No weakness, numbness, change in gait,  Psychology  No depression, anxiety, insomnia.    Current Meds:  Outpatient Encounter Medications as of 12/08/2019  Medication Sig  . acetaminophen (TYLENOL) 500 MG tablet Take 500 mg by mouth every 6 (six) hours as needed for moderate pain.   . Marland Kitchenlendronate (FOSAMAX) 70 MG tablet Take 70 mg by mouth once a week.   . Calcium Carb-Cholecalciferol (CALCIUM 600+D3) 600-200 MG-UNIT TABS Take 1 tablet by mouth daily.  . Marland Kitchentodolac (LODINE) 400 MG tablet Take  400 mg by mouth daily as needed for moderate pain.   . fluticasone (FLONASE) 50 MCG/ACT nasal spray Place 1 spray into both nostrils daily.  [name] loratadine (CLARITIN) 10 MG tablet Take 10 mg by mouth daily.  . Multiple Vitamin (MULTIVITAMIN) tablet Take 1 tablet by mouth daily.  [name] senna-docusate  (SENOKOT-S) 8.6-50 MG tablet Take 2 tablets by mouth at bedtime. For AFTER surgery, do not take if having diarrhea  . SYNTHROID 75 MCG tablet Take 75 mcg by mouth daily before breakfast.   . traMADol (ULTRAM) 50 MG tablet Take 1 tablet (50 mg total) by mouth every 6 (six) hours as needed. For AFTER surgery, do not take and drive  . [DISCONTINUED] senna-docusate (SENOKOT-S) 8.6-50 MG tablet Take 2 tablets by mouth at bedtime. For AFTER surgery, do not take if having diarrhea  . oxyCODONE (OXY IR/ROXICODONE) 5 MG immediate release tablet Take 1 tablet (5 mg total) by mouth every 4 (four) hours as needed for severe pain.   No facility-administered encounter medications on file as of 12/08/2019.    Allergy:  Allergies  Allergen Reactions  . Gadolinium Nausea And Vomiting     Desc: Pt developed nausea and vomiting after receiving 17cc Multihance. Came back for repeat study and did fine with Magnevist. Erica [name], Onset Date: [id number]     Social Hx:   Social History   Socioeconomic History  . Marital status: Married    Spouse name: Not on file  . Number of children: Not on file  . Years of education: Not on file  . Highest education level: Not on file  Occupational History  . Not on file  Tobacco Use  . Smoking status: Never Smoker  . Smokeless tobacco: Never Used  Vaping Use  . Vaping Use: Never used  Substance and Sexual Activity  . Alcohol use: Never  . Drug use: Never  . Sexual activity: Yes  Other Topics Concern  . Not on file  Social History Narrative  . Not on file   Social Determinants of Health   Financial Resource Strain:   . Difficulty of Paying Living Expenses:   Food Insecurity:   . Worried About Charity fundraiser in the Last Year:   . Arboriculturist in the Last Year:   Transportation Needs:   . Film/video editor (Medical):   [name] Lack of Transportation (Non-Medical):   Physical Activity:   . Days of Exercise per Week:   . Minutes of Exercise per  Session:   Stress:   . Feeling of Stress :   Social Connections:   . Frequency of Communication with Friends and Family:   . Frequency of Social Gatherings with Friends and Family:   . Attends Religious Services:   . Active Member of Clubs or Organizations:   . Attends Archivist Meetings:   [name] Marital Status:   Intimate Partner Violence:   . Fear of Current or Ex-Partner:   . Emotionally Abused:   [name] Physically Abused:   . Sexually Abused:     Past Surgical Hx:  Past Surgical History:  Procedure Laterality Date  . BREAST LUMPECTOMY Right 2006   With Lymphnode discetion  . NM I-131 ABLATION W/THYROGEN ([place] HX)  1970  . OTHER SURGICAL HISTORY  1991   HNP with Fusion   . ROBOTIC ASSISTED TOTAL HYSTERECTOMY WITH BILATERAL SALPINGO OOPHERECTOMY Bilateral 01/16/2019   Procedure: XI ROBOTIC ASSISTED TOTAL HYSTERECTOMY WITH BILATERAL SALPINGO OOPHORECTOMY;  Surgeon: Everitt Amber,  MD;  Location: [facility];  Service: Gynecology;  Laterality: Bilateral;  . SENTINEL NODE BIOPSY N/A 01/16/2019   Procedure: SENTINEL LYMPH NODE BIOPSY;  Surgeon: Everitt Amber, MD;  Location: [facility];  Service: Gynecology;  Laterality: N/A;  . TUBAL LIGATION Bilateral 1975    Past Medical Hx:  Past Medical History:  Diagnosis Date  . Allergy   . Arthritis   . Breast cancer ([place])    right/2006  . Osteoarthritis   . Osteopenia   . Thyroid disease    Hypothyroidism  Patient is unaware of the stage of the breast cancer but states that she received lumpectomy sentinel lymph node dissection adjuvant radiation and chemotherapy followed by tamoxifen treatment for approximately 10 years  Past Gynecological History: Gravida 5 para 3 menopause occurred in the 38s denies any history of abnormal Pap and denies use of any hormonal agents for birth control  family Hx:  Family History  Problem Relation Age of Onset  . Hypertension Mother   . Heart disease Mother   . Cirrhosis Father   . Colon cancer Neg Hx   .  Liver disease Neg Hx   . Colon polyps Neg Hx     Vitals:  Blood pressure (!) 171/67, pulse 71, temperature 98.5 F (36.9 C), temperature source Oral, resp. rate 16, height 5' 2"  (1.575 m), weight 129 lb 12.8 oz (58.9 kg), SpO2 100 %.  Physical Exam: WD in NAD Neck  Supple NROM, without any enlargements.  Lymph Node Survey No cervical supraclavicular or inguinal adenopathy Cardiovascular  Pulse normal rate, regularity and rhythm. S1 and S2 normal.  Lungs  Clear to auscultation bilateraly, without wheezes/crackles/rhonchi. Good air movement.  Skin  No rash/lesions/breakdown  Psychiatry  Alert and oriented to person, place, and time  Abdomen  + tenderness to palpation in mid to lower mid abdomen. + guarding. + small 1cm nodule that was tender in the umbilicus. Back No CVA tenderness Genito Urinary  Smooth vaginal walls on speculum exam. There is palpable nodularity in the right deep anterior pelvis palpable on vaginal bimanual exam but not rectovaginal exam. Not fixed. Rectal  No nodularity or masses palpable Extremities  No bilateral cyanosis, clubbing or edema.  Thereasa Solo, MD, 12/08/2019, 3:16 PM

## 2019-12-09 LAB — URINE CULTURE: Culture: NO GROWTH

## 2019-12-09 LAB — CA 125: Cancer Antigen (CA) 125: 25.8 U/mL (ref 0.0–38.1)

## 2019-12-15 ENCOUNTER — Other Ambulatory Visit: Payer: Self-pay

## 2019-12-15 ENCOUNTER — Encounter (HOSPITAL_COMMUNITY): Payer: Self-pay

## 2019-12-15 ENCOUNTER — Ambulatory Visit (HOSPITAL_COMMUNITY): Payer: Medicare Other

## 2019-12-15 ENCOUNTER — Telehealth: Payer: Self-pay | Admitting: Gynecologic Oncology

## 2019-12-15 ENCOUNTER — Ambulatory Visit (HOSPITAL_COMMUNITY)
Admission: RE | Admit: 2019-12-15 | Discharge: 2019-12-15 | Disposition: A | Discharge status: Home / Self Care | Payer: Medicare Other | Source: Ambulatory Visit | Attending: Gynecologic Oncology | Admitting: Gynecologic Oncology

## 2019-12-15 DIAGNOSIS — C541 Malignant neoplasm of endometrium: Secondary | ICD-10-CM | POA: Insufficient documentation

## 2019-12-15 DIAGNOSIS — R1900 Intra-abdominal and pelvic swelling, mass and lump, unspecified site: Secondary | ICD-10-CM | POA: Diagnosis present

## 2019-12-15 DIAGNOSIS — M25559 Pain in unspecified hip: Secondary | ICD-10-CM | POA: Insufficient documentation

## 2019-12-15 MED ORDER — IOHEXOL 300 MG/ML  SOLN
100.0000 mL | Freq: Once | INTRAMUSCULAR | Status: AC | PRN
  Administered 2019-12-15: 80 mL via INTRAVENOUS

## 2019-12-15 MED ORDER — SODIUM CHLORIDE (PF) 0.9 % IJ SOLN
INTRAMUSCULAR | Status: AC
  Filled 2019-12-15: qty 50

## 2019-12-16 ENCOUNTER — Inpatient Hospital Stay (HOSPITAL_BASED_OUTPATIENT_CLINIC_OR_DEPARTMENT_OTHER): Payer: Medicare Other | Admitting: Gynecologic Oncology

## 2019-12-16 ENCOUNTER — Encounter: Payer: Self-pay | Admitting: Gynecologic Oncology

## 2019-12-16 DIAGNOSIS — Z7189 Other specified counseling: Secondary | ICD-10-CM

## 2019-12-16 DIAGNOSIS — C775 Secondary and unspecified malignant neoplasm of intrapelvic lymph nodes: Secondary | ICD-10-CM

## 2019-12-16 DIAGNOSIS — C786 Secondary malignant neoplasm of retroperitoneum and peritoneum: Secondary | ICD-10-CM

## 2019-12-16 DIAGNOSIS — N134 Hydroureter: Secondary | ICD-10-CM

## 2019-12-16 DIAGNOSIS — C541 Malignant neoplasm of endometrium: Secondary | ICD-10-CM

## 2019-12-16 NOTE — Progress Notes (Signed)
Gynecologic Oncology Telehealth Follow-up Visit.  I connected with Erica [name] on 12/16/19 at  3:45 PM EDT by telephone and verified that I am speaking with the correct person using two identifiers.  I discussed the limitations, risks, security and privacy concerns of performing an evaluation and management service by telemedicine and the availability of in-person appointments. I also discussed with the patient that there may be a patient responsible charge related to this service. The patient expressed understanding and agreed to proceed.  Other persons participating in the visit and their role in the encounter: daughter (listening/asking questions).  Patient's location: home Provider's location: [place]  Chief Complaint:  Chief Complaint  Patient presents with  . recurrent endometrial cancer    counseling and coordination of care    Assessment/Plan: [name] is a 69 y.o with recurrent grade 3 endometrioid endometrial cancer. MSI stable/MMR normal. Recurrence diagnosed 12/08/19 (peritoneal carcinomatosis, lymphadenopathy, pelvic masses).  I am recommending consideration for salvage chemotherapy with platinum and taxane. We will check ER/PR status on her tumor. We will obtain a CT of the chest.  We will have her be evaluated by urology for possible right ureteral stenting to optimize renal function for chemotherapy. We will present her case at multidisciplinary conference to ensure consensus agreement with the plan.  I discussed the above findings and plan with the patient and her daughter. They were provided opportunity to ask questions.  HPI: [name] is a 69 y.o.  G5 P3 with a history of right breast cancer who received adjuvant tamoxifen for 10 years, 2000 62,016.[name]  She reports vaginal spotting with pain that began on December 07, 2018.  A transvaginal ultrasound was notable for a uterus measuring 8.3 x 4.4 x 5.1 cm with an  endometrial stripe of 1.53 cm.  Neither ovary was visualized.  An endometrial biopsy was obtained on December 26, 2018 and returned with a grade 3 endometrioid adenocarcinoma.    The patient denies nausea vomiting or changes in bowel or bladder habits.  She reports longstanding bladder urgency and a good appetite and there is been no recent weight loss.  She states that she is very active and involved in church activities and manages her own activities of daily living.  Her family history is notable for sister with breast cancer diagnosed in the 1s.  On 01/16/19 she underwent robotic assisted total hysterectomy, BSO, SLN biopsy. Surgery was uncomplicated. Final pathology revealed a FIGO grade 3 endometrioid endometrial adenocarcinoma. There was 56m of 25mmyometrial invasion (inner half) with no LVSI, no cervical or adnexal involvement and negative bilateral SLN's. MSI stable/MMR normal.   She was diagnosed with stage IA grade 3 endometrioid adenocarcinoma and, in accordance with NCCN guidelines, vaginal brachytherapy was recommended. She received vaginal brachytherapy with Dr.Kinard between the dates October 15 through 04/03/2019.  She received a total of 30 Gray in 5 fractions of 6 Gray.  She tolerated treatments well with no toxicities or delays.  Interval Hx:   The patient presented on 12/08/19 for routine scheduled surveillance but reported severe pelvic pain since June 2021.  She reported that these are worse with micturition and passage of bowel movements.  Her symptoms particularly her pain was severe.  She did endorse left lower extremity edema since June 2021. Physical examination at that visit confirmed tenderness in the abdomen on deep palpation and a palpable mass in the right pelvis on pelvic examination.   She underwent a CT abd/pelvis  on 12/15/19 to evaluate the pain and concerning examination findings and this confirmed recurrence of the endometrial cancer: a soft tissue nodule measuring  3.4x2.6cm in the right hemipelvis, left pelvic sidewall iliac lymph node conglomerate mass measuring 2.8cm, ascites, carcinomatosis and omental nodularity, ventral peritoneal nodules, moderate right hydroureter proximal to an obstruction at the right pelvic side wall soft tissue mass.   She was recommended to receive salvage chemotherapy with platinum and taxane.   Review of Systems:  Constitutional  Feels well,   Cardiovascular  No chest pain, shortness of breath, or edema  Pulmonary  No cough or wheeze.  Gastro Intestinal  + pain with BM Genito Urinary  + pain with micturition, pelvic pain + pelvic pain Neurologic  No weakness, numbness, change in gait,  Psychology  No depression, anxiety, insomnia.    Current Meds:  Outpatient Encounter Medications as of 12/16/2019  Medication Sig  . acetaminophen (TYLENOL) 500 MG tablet Take 500 mg by mouth every 6 (six) hours as needed for moderate pain.   [name] alendronate (FOSAMAX) 70 MG tablet Take 70 mg by mouth once a week.   . Calcium Carb-Cholecalciferol (CALCIUM 600+D3) 600-200 MG-UNIT TABS Take 1 tablet by mouth daily.  [name] etodolac (LODINE) 400 MG tablet Take 400 mg by mouth daily as needed for moderate pain.   . fluticasone (FLONASE) 50 MCG/ACT nasal spray Place 1 spray into both nostrils daily.  [name] loratadine (CLARITIN) 10 MG tablet Take 10 mg by mouth daily.  . Multiple Vitamin (MULTIVITAMIN) tablet Take 1 tablet by mouth daily.  [name] oxyCODONE (OXY IR/ROXICODONE) 5 MG immediate release tablet Take 1 tablet (5 mg total) by mouth every 4 (four) hours as needed for severe pain.  [name] senna-docusate (SENOKOT-S) 8.6-50 MG tablet Take 2 tablets by mouth at bedtime. For AFTER surgery, do not take if having diarrhea  . SYNTHROID 75 MCG tablet Take 75 mcg by mouth daily before breakfast.   . traMADol (ULTRAM) 50 MG tablet Take 1 tablet (50 mg total) by mouth every 6 (six) hours as needed. For AFTER surgery, do not take and drive   No  facility-administered encounter medications on file as of 12/16/2019.    Allergy:  Allergies  Allergen Reactions  . Gadolinium Nausea And Vomiting     Desc: Pt developed nausea and vomiting after receiving 17cc Multihance. Came back for repeat study and did fine with Magnevist. [name], Onset Date: [id number]     Social Hx:   Social History   Socioeconomic History  . Marital status: Married    Spouse name: Not on file  . Number of children: Not on file  . Years of education: Not on file  . Highest education level: Not on file  Occupational History  . Not on file  Tobacco Use  . Smoking status: Never Smoker  . Smokeless tobacco: Never Used  Vaping Use  . Vaping Use: Never used  Substance and Sexual Activity  . Alcohol use: Never  . Drug use: Never  . Sexual activity: Yes  Other Topics Concern  . Not on file  Social History Narrative  . Not on file   Social Determinants of Health   Financial Resource Strain:   . Difficulty of Paying Living Expenses:   Food Insecurity:   . Worried About Charity fundraiser in the Last Year:   . Arboriculturist in the Last Year:   Transportation Needs:   . Film/video editor (Medical):   [name] Lack  of Transportation (Non-Medical):   Physical Activity:   . Days of Exercise per Week:   . Minutes of Exercise per Session:   Stress:   . Feeling of Stress :   Social Connections:   . Frequency of Communication with Friends and Family:   . Frequency of Social Gatherings with Friends and Family:   . Attends Religious Services:   . Active Member of Clubs or Organizations:   . Attends Archivist Meetings:   [name] Marital Status:   Intimate Partner Violence:   . Fear of Current or Ex-Partner:   . Emotionally Abused:   [name] Physically Abused:   . Sexually Abused:     Past Surgical Hx:  Past Surgical History:  Procedure Laterality Date  . BREAST LUMPECTOMY Right 2006   With Lymphnode discetion  . NM I-131 ABLATION W/THYROGEN  ([place] HX)  1970  . OTHER SURGICAL HISTORY  1991   HNP with Fusion   . ROBOTIC ASSISTED TOTAL HYSTERECTOMY WITH BILATERAL SALPINGO OOPHERECTOMY Bilateral 01/16/2019   Procedure: XI ROBOTIC ASSISTED TOTAL HYSTERECTOMY WITH BILATERAL SALPINGO OOPHORECTOMY;  Surgeon: Everitt Amber, MD;  Location: [facility];  Service: Gynecology;  Laterality: Bilateral;  . SENTINEL NODE BIOPSY N/A 01/16/2019   Procedure: SENTINEL LYMPH NODE BIOPSY;  Surgeon: Everitt Amber, MD;  Location: [facility];  Service: Gynecology;  Laterality: N/A;  . TUBAL LIGATION Bilateral 1975    Past Medical Hx:  Past Medical History:  Diagnosis Date  . Allergy   . Arthritis   . Breast cancer ([place])    right/2006  . Osteoarthritis   . Osteopenia   . Thyroid disease    Hypothyroidism  Patient is unaware of the stage of the breast cancer but states that she received lumpectomy sentinel lymph node dissection adjuvant radiation and chemotherapy followed by tamoxifen treatment for approximately 10 years  Past Gynecological History: Gravida 5 para 3 menopause occurred in the 27s denies any history of abnormal Pap and denies use of any hormonal agents for birth control  family Hx:  Family History  Problem Relation Age of Onset  . Hypertension Mother   . Heart disease Mother   . Cirrhosis Father   . Colon cancer Neg Hx   . Liver disease Neg Hx   . Colon polyps Neg Hx     Vitals:  There were no vitals taken for this visit.  Physical Exam: Deferred due to phone visit.  I discussed the assessment and treatment plan with the patient. The patient was provided with an opportunity to ask questions and all were answered. The patient agreed with the plan and demonstrated an understanding of the instructions.   The patient was advised to call back or see an in-person evaluation if the symptoms worsen or if the condition fails to improve as anticipated.   I provided 20 minutes of non face-to-face telephone visit time during this encounter, and >  50% was spent counseling as documented under my assessment & plan.   Thereasa Solo, MD, 12/16/2019, 3:52 PM

## 2019-12-17 ENCOUNTER — Telehealth: Payer: Self-pay | Admitting: Oncology

## 2019-12-17 NOTE — Telephone Encounter (Signed)
Mickel Baas from [facility] called.  Appointment has been made with Dr. Alinda Money for Tuesday, 12/23/19 at 3:00.  Mickel Baas will notify patient.

## 2019-12-17 NOTE — Telephone Encounter (Signed)
Faxed urgent referral to [facility] and also called and spoke to the triage nurse, Mickel Baas.  She will get patient scheduled and will call her with an appointment.  Mal Amabile with appointment to see Dr. Alvy Bimler on 12/22/19 at 1:45, CT chest on 12/24/19 at 3:30 (3:15 arrival, water only 4 hours prior) and genetic counseling on 12/29/19 at 9 am.  Also advised her that [facility] will be calling her with an appointment.  She verbalized understanding of appointments and instructions.

## 2019-12-22 ENCOUNTER — Other Ambulatory Visit: Payer: Self-pay

## 2019-12-22 ENCOUNTER — Inpatient Hospital Stay: Payer: Medicare Other | Attending: Gynecologic Oncology | Admitting: Hematology and Oncology

## 2019-12-22 ENCOUNTER — Other Ambulatory Visit: Payer: Self-pay | Admitting: Hematology and Oncology

## 2019-12-22 ENCOUNTER — Ambulatory Visit: Payer: Medicare Other | Admitting: Hematology and Oncology

## 2019-12-22 ENCOUNTER — Encounter: Payer: Self-pay | Admitting: Hematology and Oncology

## 2019-12-22 VITALS — BP 161/78 | HR 89 | Temp 98.5°F | Resp 18 | Ht 62.0 in | Wt 129.2 lb

## 2019-12-22 DIAGNOSIS — M7989 Other specified soft tissue disorders: Secondary | ICD-10-CM

## 2019-12-22 DIAGNOSIS — R600 Localized edema: Secondary | ICD-10-CM

## 2019-12-22 DIAGNOSIS — R030 Elevated blood-pressure reading, without diagnosis of hypertension: Secondary | ICD-10-CM | POA: Diagnosis not present

## 2019-12-22 DIAGNOSIS — M1990 Unspecified osteoarthritis, unspecified site: Secondary | ICD-10-CM | POA: Diagnosis not present

## 2019-12-22 DIAGNOSIS — G893 Neoplasm related pain (acute) (chronic): Secondary | ICD-10-CM | POA: Diagnosis not present

## 2019-12-22 DIAGNOSIS — D539 Nutritional anemia, unspecified: Secondary | ICD-10-CM

## 2019-12-22 DIAGNOSIS — E039 Hypothyroidism, unspecified: Secondary | ICD-10-CM | POA: Insufficient documentation

## 2019-12-22 DIAGNOSIS — C541 Malignant neoplasm of endometrium: Secondary | ICD-10-CM

## 2019-12-22 DIAGNOSIS — Z7189 Other specified counseling: Secondary | ICD-10-CM

## 2019-12-22 DIAGNOSIS — I700 Atherosclerosis of aorta: Secondary | ICD-10-CM | POA: Insufficient documentation

## 2019-12-22 DIAGNOSIS — M8580 Other specified disorders of bone density and structure, unspecified site: Secondary | ICD-10-CM | POA: Insufficient documentation

## 2019-12-22 DIAGNOSIS — I10 Essential (primary) hypertension: Secondary | ICD-10-CM | POA: Insufficient documentation

## 2019-12-22 DIAGNOSIS — Z79899 Other long term (current) drug therapy: Secondary | ICD-10-CM | POA: Insufficient documentation

## 2019-12-22 DIAGNOSIS — R188 Other ascites: Secondary | ICD-10-CM | POA: Insufficient documentation

## 2019-12-22 MED ORDER — ONDANSETRON HCL 8 MG PO TABS: 8.0000 mg | ORAL_TABLET | Freq: Three times a day (TID) | ORAL | 1 refills | Status: DC | PRN

## 2019-12-22 MED ORDER — DEXAMETHASONE 4 MG PO TABS: ORAL_TABLET | ORAL | 6 refills | Status: DC

## 2019-12-22 MED ORDER — PROCHLORPERAZINE MALEATE 10 MG PO TABS: 10.0000 mg | ORAL_TABLET | Freq: Four times a day (QID) | ORAL | 1 refills | Status: DC | PRN

## 2019-12-22 MED FILL — DEXAMETHASONE 4 MG TABLET: 4 | 84 days supply | Qty: 16 | Fill #0

## 2019-12-22 MED FILL — PROCHLORPERAZINE 10 MG TAB: 10 | 7 days supply | Qty: 30 | Fill #0

## 2019-12-22 MED FILL — ONDANSETRON HCL 8 MG TABLET: 8 | 10 days supply | Qty: 30 | Fill #0

## 2019-12-22 NOTE — Assessment & Plan Note (Signed)
She has chronic macrocytic anemia I will order additional work-up to rule out nutritional deficiency

## 2019-12-22 NOTE — Assessment & Plan Note (Signed)
I have reviewed her imaging studies CT scan of chest is in progress to document burden of disease She has stage IV disease Her condition is treatable but at this point is not considered curable  We reviewed the NCCN guidelines We discussed the role of chemotherapy. The intent is of palliative intent.  We discussed some of the risks, benefits, side-effects of carboplatin & Taxol. Treatment is intravenous, every 3 weeks x 6 cycles  Some of the short term side-effects included, though not limited to, including weight loss, life threatening infections, risk of allergic reactions, need for transfusions of blood products, nausea, vomiting, change in bowel habits, loss of hair, admission to hospital for various reasons, and risks of death.   Long term side-effects are also discussed including risks of infertility, permanent damage to nerve function, hearing loss, chronic fatigue, kidney damage with possibility needing hemodialysis, and rare secondary malignancy including bone marrow disorders.  The patient is aware that the response rates discussed earlier is not guaranteed.  After a long discussion, patient made an informed decision to proceed with the prescribed plan of care.   Patient education material was dispensed. We discussed premedication with dexamethasone before chemotherapy. I do not plan prophylactic G-CSF support She had good venous access and does not need a port She will get chemo education class, baseline blood work and then see me back next week to start first dose of treatment

## 2019-12-22 NOTE — Assessment & Plan Note (Signed)
Plan of care is discussed with the patient and her daughter over the phone I will see her next week before start of treatment for further management

## 2019-12-22 NOTE — Assessment & Plan Note (Signed)
She has pain medicine to take as needed We discussed narcotic refill policy

## 2019-12-22 NOTE — Progress Notes (Signed)
[place] NOTE  Patient Care Team: [name], MD as PCP - General [facility])  ASSESSMENT & PLAN:  Endometrial cancer [facility]) I have reviewed her imaging studies CT scan of chest is in progress to document burden of disease She has stage IV disease Her condition is treatable but at this point is not considered curable  We reviewed the NCCN guidelines We discussed the role of chemotherapy. The intent is of palliative intent.  We discussed some of the risks, benefits, side-effects of carboplatin & Taxol. Treatment is intravenous, every 3 weeks x 6 cycles  Some of the short term side-effects included, though not limited to, including weight loss, life threatening infections, risk of allergic reactions, need for transfusions of blood products, nausea, vomiting, change in bowel habits, loss of hair, admission to hospital for various reasons, and risks of death.   Long term side-effects are also discussed including risks of infertility, permanent damage to nerve function, hearing loss, chronic fatigue, kidney damage with possibility needing hemodialysis, and rare secondary malignancy including bone marrow disorders.  The patient is aware that the response rates discussed earlier is not guaranteed.  After a long discussion, patient made an informed decision to proceed with the prescribed plan of care.   Patient education material was dispensed. We discussed premedication with dexamethasone before chemotherapy. I do not plan prophylactic G-CSF support She had good venous access and does not need a port She will get chemo education class, baseline blood work and then see me back next week to start first dose of treatment   Deficiency anemia She has chronic macrocytic anemia I will order additional work-up to rule out nutritional deficiency  Left leg swelling She has bilateral lower extremity edema, left greater than the right She is having also pain at  the back of her calf and was prescribed pain medicine I recommend urgent ultrasound venous Doppler evaluation to rule out DVT  Goals of care, counseling/discussion Plan of care is discussed with the patient and her daughter over the phone I will see her next week before start of treatment for further management  Cancer associated pain She has pain medicine to take as needed We discussed narcotic refill policy  Elevated BP without diagnosis of hypertension We will recheck again before starting treatment Is either due to pain or she has undiagnosed hypertension that requires therapy   Orders Placed This Encounter  Procedures  . CBC with Differential (Cancer Center Only)    Standing Status:   Standing    Number of Occurrences:   20    Standing Expiration Date:   12/21/2020  . CMP ([place] only)    Standing Status:   Standing    Number of Occurrences:   20    Standing Expiration Date:   12/21/2020  . Iron and TIBC    Standing Status:   Future    Standing Expiration Date:   12/21/2020  . Vitamin B12    Standing Status:   Future    Standing Expiration Date:   12/21/2020  . Ferritin    Standing Status:   Future    Standing Expiration Date:   12/21/2020  . Reticulocytes ([place])    Standing Status:   Future    Standing Expiration Date:   12/21/2020    The total time spent in the appointment was 60 minutes encounter with patients including review of chart and various tests results, discussions about plan of care and coordination of care plan  All questions were answered. The patient knows to call the clinic with any problems, questions or concerns. No barriers to learning was detected.  [name], MD 8/2/20215:27 PM  CHIEF COMPLAINTS/PURPOSE OF CONSULTATION:  Recurrent uterine cancer with carcinomatosis, for further management  HISTORY OF PRESENTING ILLNESS:  Erica [name] 69 y.o. female is here because of recent diagnosis of recurrent uterine cancer This patient have  history of breast cancer and uterine cancer Her daughter, Erica [name] is available over the phone  I have reviewed her chart and materials related to her cancer extensively and collaborated history with the patient. Summary of oncologic history is as follows: Oncology History Overview Note  ER/PR negative, MSI stable   Endometrial cancer ([place])  01/08/2019 Imaging   Hypermetabolic activity within central uterus, consistent with known endometrial carcinoma.   No evidence of local or distant metastatic disease.     01/16/2019 Pathology Results   1. Lymph node, sentinel, biopsy, left obturator - NO CARCINOMA IDENTIFIED IN ONE LYMPH NODE (0/1) - SEE COMMENT 2. Lymph node, sentinel, biopsy, right obturator - NO CARCINOMA IDENTIFIED IN ONE LYMPH NODE (0/1) - SEE COMMENT 3. Uterus +/- tubes/ovaries, neoplastic, cervix, bilateral fallopian tubes and ovaries UTERUS: - ENDOMETRIOID CARCINOMA, FIGO GRADE 3, CONFINED TO THE UTERUS - SEE ONCOLOGY TABLE BELOW CERVIX: - NO CARCINOMA IDENTIFIED BILATERAL OVARIES: - NO CARCINOMA IDENTIFIED BILATERAL FALLOPIAN TUBES: - BENIGN PARATUBAL CYSTS (RIGHT) - NO CARCINOMA IDENTIFIED Microscopic Comment 1. and 2. Cytokeratin AE1/3 was performed on the sentinel lymph nodes to exclude micrometastasis. There is no evidence of metastatic carcinoma by immunohistochemistry. 3. UTERUS, CARCINOMA OR CARCINOSARCOMA Procedure: Hysterectomy and bilateral salpingo-oophorectomy Histologic type: Endometrioid carcinoma Histologic Grade: FIGO Grade 3 Myometrial invasion: Depth of invasion: 4 mm 1 ofMyometrial thickness: 27 mm Uterine Serosa Involvement: Not identified Cervical stromal involvement: Not identified Extent of involvement of other organs: N/A Lymphovascular invasion: Not identified Regional Lymph Nodes: Examined: 2 Sentinel 0 Non-sentinel 2 Total Lymph nodes with metastasis: N/A Isolated tumor cells (< 0.2 mm): 0 Micrometastasis: (> 0.2 mm and < 2.0 mm):  0 Macrometastasis: (> 2.0 mm): 0 Extracapsular extension: N/A Tumor block for ancillary studies: 3C MMR / MSI testing: Pending Pathologic Stage Classification (pTNM, AJCC 8th edition): pT1a, pN0 FIGO Stage: FIGO Ia   01/16/2019 Surgery   Surgeon: Donaciano Eva  Pre-operative Diagnosis: endometrial cancer grade 3    Operation: Robotic-assisted laparoscopic total hysterectomy with bilateral salpingoophorectomy, SLN biopsy  Operative Findings:  : 9cm uterus (mildly broadened and bulky), normal appearing fallopian tubes and ovaries.       12/15/2019 Imaging   1. Status post hysterectomy. Soft tissue nodule in the right hemipelvis measuring 3.4 x 2.6 cm. Soft tissue nodule or left pelvic sidewall/iliac lymph node conglomerate measuring 2.8 x 2.1 cm. Findings are consistent with recurrent/metastatic disease. 2. Moderate volume ascites throughout the abdomen and pelvis with fine peritoneal nodularity noted in the pelvis and new omental or ventral peritoneal soft tissue thickening, consistent with peritoneal metastatic disease. 3. Moderate right hydronephrosis and hydroureter, the distal right ureter obstructed by right pelvic soft tissue mass noted above. 4. Aortic Atherosclerosis (ICD10-I70.0).   12/22/2019 Cancer Staging   Staging form: Corpus Uteri - Carcinoma and Carcinosarcoma, AJCC 8th Edition - Clinical stage from 12/22/2019: FIGO Stage IVB (rcT1a, cN0, cM1) - Signed by [name], MD on 12/22/2019    She is having intermittent lower abdominal pain and acute and progressive left leg swelling and thigh pain over a month ago She is prescribed  some pain medicine She have some baseline constipation No recent nausea or vomiting She lives with 1 son She is active in activities of daily living  MEDICAL HISTORY:  Past Medical History:  Diagnosis Date  . Allergy   . Arthritis   . Breast cancer ([place])    right/2006  . Osteoarthritis   . Osteopenia   . Thyroid disease     Hypothyroidism    SURGICAL HISTORY: Past Surgical History:  Procedure Laterality Date  . BREAST LUMPECTOMY Right 2006   With Lymphnode discetion  . NM I-131 ABLATION W/THYROGEN ([place] HX)  1970  . OTHER SURGICAL HISTORY  1991   HNP with Fusion   . ROBOTIC ASSISTED TOTAL HYSTERECTOMY WITH BILATERAL SALPINGO OOPHERECTOMY Bilateral 01/16/2019   Procedure: XI ROBOTIC ASSISTED TOTAL HYSTERECTOMY WITH BILATERAL SALPINGO OOPHORECTOMY;  Surgeon: Everitt Amber, MD;  Location: [facility];  Service: Gynecology;  Laterality: Bilateral;  . SENTINEL NODE BIOPSY N/A 01/16/2019   Procedure: SENTINEL LYMPH NODE BIOPSY;  Surgeon: Everitt Amber, MD;  Location: [facility];  Service: Gynecology;  Laterality: N/A;  . TUBAL LIGATION Bilateral 1975    SOCIAL HISTORY: Social History   Socioeconomic History  . Marital status: Widowed    Spouse name: Not on file  . Number of children: 3  . Years of education: Not on file  . Highest education level: Not on file  Occupational History  . Occupation: retired  Tobacco Use  . Smoking status: Never Smoker  . Smokeless tobacco: Never Used  Vaping Use  . Vaping Use: Never used  Substance and Sexual Activity  . Alcohol use: Never  . Drug use: Never  . Sexual activity: Yes  Other Topics Concern  . Not on file  Social History Narrative  . Not on file   Social Determinants of Health   Financial Resource Strain:   . Difficulty of Paying Living Expenses:   Food Insecurity:   . Worried About Charity fundraiser in the Last Year:   . Arboriculturist in the Last Year:   Transportation Needs:   . Film/video editor (Medical):   [name] Lack of Transportation (Non-Medical):   Physical Activity:   . Days of Exercise per Week:   . Minutes of Exercise per Session:   Stress:   . Feeling of Stress :   Social Connections:   . Frequency of Communication with Friends and Family:   . Frequency of Social Gatherings with Friends and Family:   . Attends Religious Services:   .  Active Member of Clubs or Organizations:   . Attends Archivist Meetings:   [name] Marital Status:   Intimate Partner Violence:   . Fear of Current or Ex-Partner:   . Emotionally Abused:   [name] Physically Abused:   . Sexually Abused:     FAMILY HISTORY: Family History  Problem Relation Age of Onset  . Hypertension Mother   . Heart disease Mother   . Cirrhosis Father   . Colon cancer Neg Hx   . Liver disease Neg Hx   . Colon polyps Neg Hx     ALLERGIES:  is allergic to gadolinium.  MEDICATIONS:  Current Outpatient Medications  Medication Sig Dispense Refill  . acetaminophen (TYLENOL) 500 MG tablet Take 500 mg by mouth every 6 (six) hours as needed for moderate pain.     [name] alendronate (FOSAMAX) 70 MG tablet Take 70 mg by mouth once a week.     . Calcium Carb-Cholecalciferol (CALCIUM  600+D3) 600-200 MG-UNIT TABS Take 1 tablet by mouth daily.    [name] dexamethasone (DECADRON) 4 MG tablet Take 2 tabs at the night before and 2 tab the morning of chemotherapy, every 3 weeks, by mouth x 6 cycles 36 tablet 6  . etodolac (LODINE) 400 MG tablet Take 400 mg by mouth daily as needed for moderate pain.     . fluticasone (FLONASE) 50 MCG/ACT nasal spray Place 1 spray into both nostrils daily.    [name] loratadine (CLARITIN) 10 MG tablet Take 10 mg by mouth daily.    . Multiple Vitamin (MULTIVITAMIN) tablet Take 1 tablet by mouth daily.    . ondansetron (ZOFRAN) 8 MG tablet Take 1 tablet (8 mg total) by mouth every 8 (eight) hours as needed. Start on the third day after chemotherapy. 30 tablet 1  . oxyCODONE (OXY IR/ROXICODONE) 5 MG immediate release tablet Take 1 tablet (5 mg total) by mouth every 4 (four) hours as needed for severe pain. 50 tablet 0  . prochlorperazine (COMPAZINE) 10 MG tablet Take 1 tablet (10 mg total) by mouth every 6 (six) hours as needed (Nausea or vomiting). 30 tablet 1  . senna-docusate (SENOKOT-S) 8.6-50 MG tablet Take 2 tablets by mouth at bedtime. For AFTER surgery, do not  take if having diarrhea 30 tablet 0  . SYNTHROID 75 MCG tablet Take 75 mcg by mouth daily before breakfast.     . traMADol (ULTRAM) 50 MG tablet Take 1 tablet (50 mg total) by mouth every 6 (six) hours as needed. For AFTER surgery, do not take and drive 10 tablet 0   No current facility-administered medications for this visit.    REVIEW OF SYSTEMS:   Constitutional: Denies fevers, chills or abnormal night sweats Eyes: Denies blurriness of vision, double vision or watery eyes Ears, nose, mouth, throat, and face: Denies mucositis or sore throat Respiratory: Denies cough, dyspnea or wheezes Cardiovascular: Denies palpitation, chest discomfort  Gastrointestinal:  Denies nausea, heartburn or change in bowel habits Skin: Denies abnormal skin rashes Lymphatics: Denies new lymphadenopathy or easy bruising Neurological:Denies numbness, tingling or new weaknesses Behavioral/Psych: Mood is stable, no new changes  All other systems were reviewed with the patient and are negative.  PHYSICAL EXAMINATION: ECOG PERFORMANCE STATUS: 1 - Symptomatic but completely ambulatory  Vitals:   12/22/19 1359  BP: (!) 161/78  Pulse: 89  Resp: 18  Temp: 98.5 F (36.9 C)  SpO2: 97%   Filed Weights   12/22/19 1359  Weight: 129 lb 3.2 oz (58.6 kg)    GENERAL:alert, no distress and comfortable SKIN: skin color, texture, turgor are normal, no rashes or significant lesions EYES: normal, conjunctiva are pink and non-injected, sclera clear OROPHARYNX:no exudate, no erythema and lips, buccal mucosa, and tongue normal  NECK: supple, thyroid normal size, non-tender, without nodularity LYMPH:  no palpable lymphadenopathy in the cervical, axillary or inguinal LUNGS: clear to auscultation and percussion with normal breathing effort HEART: regular rate & rhythm and no murmurs moderate bilateral lower extremity edema, left greater than right ABDOMEN:abdomen soft, non-tender and normal bowel sounds Musculoskeletal:no  cyanosis of digits and no clubbing  PSYCH: alert & oriented x 3 with fluent speech NEURO: no focal motor/sensory deficits  LABORATORY DATA:  I have reviewed the data as listed Lab Results  Component Value Date   WBC 6.8 12/08/2019   HGB 10.7 (L) 12/08/2019   HCT 33.2 (L) 12/08/2019   MCV 104.7 (H) 12/08/2019   PLT 287 12/08/2019   Recent Labs  01/13/19 1220 12/08/19 1544  NA 141 139  K 3.8 3.7  CL 105 102  CO2 30 28  GLUCOSE 98 86  BUN 7* 7*  CREATININE 0.67 0.74  CALCIUM 9.4 10.0  GFRNONAA >60 >60  GFRAA >60 >60  PROT 7.5 8.0  ALBUMIN 3.8 3.8  AST 20 21  ALT 12 8  ALKPHOS 55 60  BILITOT 0.6 0.4    RADIOGRAPHIC STUDIES: I have personally reviewed the radiological images as listed and agreed with the findings in the report. CT Abdomen Pelvis W Contrast  Result Date: 12/15/2019 CLINICAL DATA:  Endometrial cancer, s/p hysterectomy and vaginal brachytherapy, abdominal pain and palpable mass, clinical concern for recurrence EXAM: CT ABDOMEN AND PELVIS WITH CONTRAST TECHNIQUE: Multidetector CT imaging of the abdomen and pelvis was performed using the standard protocol following bolus administration of intravenous contrast. CONTRAST:  43m OMNIPAQUE IOHEXOL 300 MG/ML SOLN, additional oral enteric contrast COMPARISON:  PET-CT, 01/08/2019 FINDINGS: Lower chest: No acute abnormality. Fibrotic change in the left lung base. Hepatobiliary: No solid liver abnormality is seen. No gallstones, gallbladder wall thickening, or biliary dilatation. Pancreas: Unremarkable. No pancreatic ductal dilatation or surrounding inflammatory changes. Spleen: Normal in size without significant abnormality. Adrenals/Urinary Tract: Adrenal glands are unremarkable. Moderate right hydronephrosis and hydroureter, the distal right ureter obstructed by a soft tissue mass in the right hemipelvis (series 2, image 59). Bladder is unremarkable. Stomach/Bowel: Stomach is within normal limits. Appendix appears normal.  There is new omental and or peritoneal soft tissue thickening in the ventral abdomen (series 2, image 34). Vascular/Lymphatic: Aortic atherosclerosis. Possible enlarged left pelvic sidewall/iliac lymph nodes versus soft tissue nodules as noted above, no other enlarged lymph nodes in the abdomen or pelvis. Reproductive: Status post hysterectomy. Soft tissue nodule in the right hemipelvis measuring 3.4 x 2.6 cm (series 2, image 59). Soft tissue nodule or left pelvic sidewall/iliac lymph node conglomerate measuring 2.8 x 2.1 cm (series 2, image 56). Other: No abdominal wall hernia or abnormality. Moderate volume ascites throughout the abdomen and pelvis with fine peritoneal nodularity noted in the pelvis (series 2, image 64). Musculoskeletal: No acute or significant osseous findings. IMPRESSION: 1. Status post hysterectomy. Soft tissue nodule in the right hemipelvis measuring 3.4 x 2.6 cm. Soft tissue nodule or left pelvic sidewall/iliac lymph node conglomerate measuring 2.8 x 2.1 cm. Findings are consistent with recurrent/metastatic disease. 2. Moderate volume ascites throughout the abdomen and pelvis with fine peritoneal nodularity noted in the pelvis and new omental or ventral peritoneal soft tissue thickening, consistent with peritoneal metastatic disease. 3. Moderate right hydronephrosis and hydroureter, the distal right ureter obstructed by right pelvic soft tissue mass noted above. 4. Aortic Atherosclerosis (ICD10-I70.0). These results will be called to the ordering clinician or representative by the Radiologist Assistant, and communication documented in the PACS or [employer] Electronically Signed   By: AEddie CandleM.D.   On: 12/15/2019 09:13

## 2019-12-22 NOTE — Assessment & Plan Note (Signed)
We will recheck again before starting treatment Is either due to pain or she has undiagnosed hypertension that requires therapy

## 2019-12-22 NOTE — Progress Notes (Signed)
START ON PATHWAY REGIMEN - Uterine     A cycle is every 21 days:     Paclitaxel      Carboplatin   **Always confirm dose/schedule in your pharmacy ordering system**  Patient Characteristics: Endometrioid, Recurrent/Progressive Disease, Second Line, Systemic Recurrence, No Previous Chemotherapy Histology: Endometrioid Therapeutic Status: Recurrent or Progressive Disease Line of Therapy: Second Line Time to Recurrence: No Previous Chemotherapy Intent of Therapy: Non-Curative / Palliative Intent, Discussed with Patient

## 2019-12-22 NOTE — Assessment & Plan Note (Signed)
She has bilateral lower extremity edema, left greater than the right She is having also pain at the back of her calf and was prescribed pain medicine I recommend urgent ultrasound venous Doppler evaluation to rule out DVT

## 2019-12-23 ENCOUNTER — Ambulatory Visit (HOSPITAL_COMMUNITY)
Admission: RE | Admit: 2019-12-23 | Discharge: 2019-12-23 | Disposition: A | Discharge status: Home / Self Care | Payer: Medicare Other | Source: Ambulatory Visit | Attending: Hematology and Oncology | Admitting: Hematology and Oncology

## 2019-12-23 ENCOUNTER — Telehealth: Payer: Self-pay

## 2019-12-23 DIAGNOSIS — M7989 Other specified soft tissue disorders: Secondary | ICD-10-CM | POA: Diagnosis not present

## 2019-12-23 DIAGNOSIS — C541 Malignant neoplasm of endometrium: Secondary | ICD-10-CM | POA: Insufficient documentation

## 2019-12-23 NOTE — Telephone Encounter (Signed)
Called and given below message from Dr. Alvy Bimler. She verbalized understanding.

## 2019-12-23 NOTE — Telephone Encounter (Signed)
Called and left a message asking her to call the office back. 

## 2019-12-23 NOTE — Telephone Encounter (Signed)
The swelling is due to her abdominal cancer It is very good news we rule out DVT

## 2019-12-23 NOTE — Telephone Encounter (Signed)
Erica [name] with Vascular lab called and left a message with pre report. She is negative bilaterally for DVT. Sharyn Lull ask that we call the patient with results and what may be causing the swelling.

## 2019-12-23 NOTE — Progress Notes (Signed)
Bilateral lower extremity venous duplex completed. Refer to "CV Proc" under chart review to view preliminary results.  12/23/2019 9:30 AM Kelby Aline., MHA, RVT, RDCS, RDMS

## 2019-12-24 ENCOUNTER — Ambulatory Visit (HOSPITAL_COMMUNITY)
Admission: RE | Admit: 2019-12-24 | Discharge: 2019-12-24 | Disposition: A | Discharge status: Home / Self Care | Payer: Medicare Other | Source: Ambulatory Visit | Attending: Gynecologic Oncology | Admitting: Gynecologic Oncology

## 2019-12-24 DIAGNOSIS — C775 Secondary and unspecified malignant neoplasm of intrapelvic lymph nodes: Secondary | ICD-10-CM | POA: Diagnosis present

## 2019-12-24 DIAGNOSIS — C786 Secondary malignant neoplasm of retroperitoneum and peritoneum: Secondary | ICD-10-CM | POA: Diagnosis present

## 2019-12-24 DIAGNOSIS — C541 Malignant neoplasm of endometrium: Secondary | ICD-10-CM | POA: Insufficient documentation

## 2019-12-24 MED ORDER — IOHEXOL 300 MG/ML  SOLN
100.0000 mL | Freq: Once | INTRAMUSCULAR | Status: AC | PRN
  Administered 2019-12-24: 100 mL via INTRAVENOUS

## 2019-12-24 MED ORDER — SODIUM CHLORIDE (PF) 0.9 % IJ SOLN
INTRAMUSCULAR | Status: AC
  Filled 2019-12-24: qty 50

## 2019-12-25 ENCOUNTER — Other Ambulatory Visit: Payer: Self-pay | Admitting: Urology

## 2019-12-25 ENCOUNTER — Encounter (HOSPITAL_COMMUNITY): Payer: Self-pay

## 2019-12-25 ENCOUNTER — Other Ambulatory Visit (HOSPITAL_COMMUNITY)
Admission: RE | Admit: 2019-12-25 | Discharge: 2019-12-25 | Disposition: A | Discharge status: Home / Self Care | Payer: Medicare Other | Source: Ambulatory Visit | Attending: Urology | Admitting: Urology

## 2019-12-25 DIAGNOSIS — Z20822 Contact with and (suspected) exposure to covid-19: Secondary | ICD-10-CM | POA: Insufficient documentation

## 2019-12-25 DIAGNOSIS — Z01812 Encounter for preprocedural laboratory examination: Secondary | ICD-10-CM | POA: Diagnosis present

## 2019-12-25 LAB — SARS CORONAVIRUS 2 (TAT 6-24 HRS): SARS Coronavirus 2: NEGATIVE

## 2019-12-25 NOTE — Patient Instructions (Addendum)
DUE TO COVID-19 ONLY ONE VISITOR ARE ALLOWED TO COME WITH YOU AND STAY IN THE WAITING ROOM ONLY DURING PRE OP AND PROCEDURE.    COVID SWAB TESTING COMPLETED ON:  Thursday, Aug, 5, 2021  (Must self quarantine after testing. Follow instructions on handout.)       Your procedure is scheduled on: Monday, Aug. 9, 2021   Report to [facility] Main  Entrance    Report to admitting at 1:00 PM   Call this number if you have problems the morning of surgery [phone]   Do not eat food :After Midnight.   May have liquids until  12:00 NOON day of surgery  CLEAR LIQUID DIET  Foods Allowed                                                                     Foods Excluded  Water, Black Coffee and tea, regular and decaf                             liquids that you cannot  Plain Jell-O in any flavor  (No red)                                           see through such as: Fruit ices (not with fruit pulp)                                     milk, soups, orange juice              Iced Popsicles (No red)                                    All solid food                                   Apple juices Sports drinks like Gatorade (No red) Lightly seasoned clear broth or consume(fat free) Sugar, honey syrup  Sample Menu Breakfast                                Lunch                                     Supper Cranberry juice                    Beef broth                            Chicken broth Jell-O  Grape juice                           Apple juice Coffee or tea                        Jell-O                                      Popsicle                                                Coffee or tea                        Coffee or tea      Oral Hygiene is also important to reduce your risk of infection.                                    Remember - BRUSH YOUR TEETH THE MORNING OF SURGERY WITH YOUR REGULAR TOOTHPASTE   Do NOT smoke after Midnight   Take  these medicines the morning of surgery with A SIP OF WATER: Levothyroxine, Loratadine                               You may not have any metal on your body including hair pins, jewelry, and body piercings             Do not wear make-up, lotions, powders, perfumes/cologne, or deodorant             Do not wear nail polish.  Do not shave  48 hours prior to surgery.               Do not bring valuables to the hospital. [place].   Contacts, dentures or bridgework may not be worn into surgery.   Patients discharged the day of surgery will not be allowed to drive home.   Special Instructions: Bring a copy of your healthcare power of attorney and living will documents         the day of surgery if you haven't scanned them in before.              Please read over the following fact sheets you were given: IF YOU HAVE QUESTIONS ABOUT YOUR PRE OP INSTRUCTIONS PLEASE CALL [phone]   [place] - Preparing for Surgery Before surgery, you can play an important role.  Because skin is not sterile, your skin needs to be as free of germs as possible.  You can reduce the number of germs on your skin by washing with CHG (chlorahexidine gluconate) soap before surgery.  CHG is an antiseptic cleaner which kills germs and bonds with the skin to continue killing germs even after washing. Please DO NOT use if you have an allergy to CHG or antibacterial soaps.  If your skin becomes reddened/irritated stop using the CHG and inform your nurse when you arrive at Short  Stay. Do not shave (including legs and underarms) for at least 48 hours prior to the first CHG shower.  You may shave your face/neck.  Please follow these instructions carefully:  1.  Shower with CHG Soap the night before surgery and the  morning of surgery.  2.  If you choose to wash your hair, wash your hair first as usual with your normal  shampoo.  3.  After you shampoo, rinse your hair and body  thoroughly to remove the shampoo.                             4.  Use CHG as you would any other liquid soap.  You can apply chg directly to the skin and wash.  Gently with a scrungie or clean washcloth.  5.  Apply the CHG Soap to your body ONLY FROM THE NECK DOWN.   Do   not use on face/ open                           Wound or open sores. Avoid contact with eyes, ears mouth and   genitals (private parts).                       Wash face,  Genitals (private parts) with your normal soap.             6.  Wash thoroughly, paying special attention to the area where your    surgery  will be performed.  7.  Thoroughly rinse your body with warm water from the neck down.  8.  DO NOT shower/wash with your normal soap after using and rinsing off the CHG Soap.                9.  Pat yourself dry with a clean towel.            10.  Wear clean pajamas.            11.  Place clean sheets on your bed the night of your first shower and do not  sleep with pets. Day of Surgery : Do not apply any lotions/deodorants the morning of surgery.  Please wear clean clothes to the hospital/surgery center.  FAILURE TO FOLLOW THESE INSTRUCTIONS MAY RESULT IN THE CANCELLATION OF YOUR SURGERY  PATIENT SIGNATURE_________________________________  NURSE SIGNATURE__________________________________  ________________________________________________________________________

## 2019-12-25 NOTE — Progress Notes (Signed)
Pharmacist Chemotherapy Monitoring - Initial Assessment    Anticipated start date: 12/31/19   Regimen:   Are orders appropriate based on the patients diagnosis, regimen, and cycle? Yes  Does the plan date match the patients scheduled date? Yes  Is the sequencing of drugs appropriate? Yes  Are the premedications appropriate for the patients regimen? Yes  Prior Authorization for treatment is: Approved o If applicable, is the correct biosimilar selected based on the patient's insurance? not applicable  Organ Function and Labs:  Are dose adjustments needed based on the patient's renal function, hepatic function, or hematologic function? No  Are appropriate labs ordered prior to the start of patient's treatment? Yes, labs to be repeated on 12/29/19  Other organ system assessment, if indicated: N/A  The following baseline labs, if indicated, have been ordered: N/A  Dose Assessment:  Are the drug doses appropriate? Yes  Are the following correct: o Drug concentrations Yes o IV fluid compatible with drug Yes o Administration routes Yes o Timing of therapy Yes  If applicable, does the patient have documented access for treatment and/or plans for port-a-cath placement? unknown  If applicable, have lifetime cumulative doses been properly documented and assessed? not applicable Lifetime Dose Tracking  No doses have been documented on this patient for the following tracked chemicals: Doxorubicin, Epirubicin, Idarubicin, Daunorubicin, Mitoxantrone, Bleomycin, Oxaliplatin, Carboplatin, Liposomal Doxorubicin  o   Toxicity Monitoring/Prevention:  The patient has the following take home antiemetics prescribed: Ondansetron and Prochlorperazine  The patient has the following take home medications prescribed: hypersensitivity pre-meds   Medication allergies and previous infusion related reactions, if applicable, have been reviewed and addressed. Yes  The patient's current medication  list has been assessed for drug-drug interactions with their chemotherapy regimen. no significant drug-drug interactions were identified on review.  Order Review:  Are the treatment plan orders signed? Yes  Is the patient scheduled to see a provider prior to their treatment? Yes  I verify that I have reviewed each item in the above checklist and answered each question accordingly.  Acquanetta Belling 12/25/2019 4:27 PM

## 2019-12-25 NOTE — Progress Notes (Addendum)
COVID Vaccine Completed: Yes Date COVID Vaccine completed: 06/2019 COVID vaccine manufacturer: [place]   (not sure)  PCP - Dr. Lady Deutscher Cardiologist - N/A  Chest x-ray - CT chest 12/24/2019 in epic EKG -greater than 1 year Stress Test - N/A ECHO - N/A Cardiac Cath - N/A  Sleep Study - N/A CPAP - N/A  Fasting Blood Sugar - N/A Checks Blood Sugar __N/A___ times a day  Blood Thinner Instructions: N/A Aspirin Instructions: N/A Last Dose: N/A  Anesthesia review:  N/A  Patient denies shortness of breath, fever, cough and chest pain at PAT appointment   Patient verbalized understanding of instructions that were given to them at the PAT appointment. Patient was also instructed that they will need to review over the PAT instructions again at home before surgery.

## 2019-12-26 ENCOUNTER — Encounter (HOSPITAL_COMMUNITY)
Admission: RE | Admit: 2019-12-26 | Discharge: 2019-12-26 | Disposition: A | Discharge status: Home / Self Care | Payer: Medicare Other | Source: Ambulatory Visit | Attending: Urology | Admitting: Urology

## 2019-12-26 ENCOUNTER — Other Ambulatory Visit: Payer: Self-pay

## 2019-12-26 ENCOUNTER — Encounter (HOSPITAL_COMMUNITY): Payer: Self-pay

## 2019-12-26 DIAGNOSIS — Z01812 Encounter for preprocedural laboratory examination: Secondary | ICD-10-CM | POA: Diagnosis not present

## 2019-12-26 HISTORY — DX: Other seasonal allergic rhinitis: J30.2

## 2019-12-26 HISTORY — DX: Personal history of urinary (tract) infections: Z87.440

## 2019-12-26 HISTORY — DX: Gastro-esophageal reflux disease without esophagitis: K21.9

## 2019-12-26 HISTORY — DX: Personal history of antineoplastic chemotherapy: Z92.21

## 2019-12-26 HISTORY — DX: Malignant neoplasm of endometrium: C54.1

## 2019-12-26 HISTORY — DX: Anemia, unspecified: D64.9

## 2019-12-26 HISTORY — DX: Personal history of irradiation: Z92.3

## 2019-12-26 HISTORY — DX: Hypothyroidism, unspecified: E03.9

## 2019-12-26 HISTORY — DX: Spontaneous ecchymoses: R23.3

## 2019-12-26 HISTORY — DX: Crossing vessel and stricture of ureter without hydronephrosis: N13.5

## 2019-12-26 LAB — BASIC METABOLIC PANEL
Anion gap: 10 (ref 5–15)
BUN: 6 mg/dL — ABNORMAL LOW (ref 8–23)
CO2: 28 mmol/L (ref 22–32)
Calcium: 9.3 mg/dL (ref 8.9–10.3)
Chloride: 95 mmol/L — ABNORMAL LOW (ref 98–111)
Creatinine, Ser: 0.56 mg/dL (ref 0.44–1.00)
GFR calc Af Amer: >60 mL/min (ref >60)
GFR calc non Af Amer: >60 mL/min (ref >60)
Glucose, Bld: 101 mg/dL — ABNORMAL HIGH (ref 70–99)
Potassium: 3.2 mmol/L — ABNORMAL LOW (ref 3.5–5.1)
Sodium: 133 mmol/L — ABNORMAL LOW (ref 135–145)

## 2019-12-26 LAB — CBC
HCT: 33.5 % — ABNORMAL LOW (ref 36.0–46.0)
Hemoglobin: 10.8 g/dL — ABNORMAL LOW (ref 12.0–15.0)
MCH: 33.8 pg (ref 26.0–34.0)
MCHC: 32.2 g/dL (ref 30.0–36.0)
MCV: 104.7 fL — ABNORMAL HIGH (ref 80.0–100.0)
Platelets: 329 10*3/uL (ref 150–400)
RBC: 3.2 MIL/uL — ABNORMAL LOW (ref 3.87–5.11)
RDW: 12.3 % (ref 11.5–15.5)
WBC: 5.6 10*3/uL (ref 4.0–10.5)
nRBC: 0 % (ref 0.0–0.2)

## 2019-12-26 NOTE — H&P (Signed)
Office Visit Report     12/23/2019   --------------------------------------------------------------------------------   Erica [name]  MRN: [id number]  DOB: 07/12/1950, 69 year old Female  SSN:    PRIMARY CARE:  Erica Cage. Laurann Montana, MD  REFERRING:  Erica Simmer. Denman George, MD  PROVIDER:  Raynelle [name], M.D.  LOCATION:  [facility] [phone]     --------------------------------------------------------------------------------   CC/HPI: Right hydronephrosis   [name] is a pleasant 69 year old female with recurrent endometrial carcinoma. She recently underwent a CT scan that demonstrated a large pelvic mass with apparent obstruction of the distal right ureter. She was noted to have right hydroureteronephrosis down to the level of this mass. Her serum creatinine is 0.74. She is scheduled to begin systemic chemotherapy next Wednesday and is seen to determine whether she can proceed with right ureteral stent placement to optimize renal function before beginning chemotherapy. She denies any hematuria. She does not have flank or abdominal pain.     ALLERGIES: Gadolinium Derivatives    MEDICATIONS: Acetaminophen  Calcium 600-Vit D3  Claritin  Compazine 10 mg tablet  Dexamethasone 4 mg tablet  Etodolac 400 mg tablet  Flonase Allergy Relief  Fosamax Plus D 70 mg-2,800 unit tablet  Multivitamins  Oxycodone Hcl 5 mg capsule  Senokot 8.6 mg tablet  Synthroid  Tramadol Hcl 50 mg tablet  Zofran     GU PSH: Hysterectomy     NON-GU PSH: Breast lumpectomy     GU PMH: Malignant neoplasm of uterine adnexa, unspecified    NON-GU PMH: Arthritis Breast Cancer, History    FAMILY HISTORY: 1 Daughter - Daughter 2 sons - Son   SOCIAL HISTORY: Marital Status: Widowed Preferred Language: English; Ethnicity: Not Hispanic Or Latino; Race: Black or African American Current Smoking Status: Patient has never smoked.   Tobacco Use Assessment Completed: Used Tobacco in last 30  days? Has never drank.  Does not drink caffeine.    REVIEW OF SYSTEMS:    GU Review Female:   Patient reports get up at night to urinate and leakage of urine. Patient denies frequent urination, hard to postpone urination, burning /pain with urination, stream starts and stops, trouble starting your stream, have to strain to urinate, and currently pregnant.  Gastrointestinal (Lower):   Patient denies diarrhea and constipation.  Gastrointestinal (Upper):   Patient denies vomiting and nausea.  Constitutional:   Patient denies fever, night sweats, weight loss, and fatigue.  Skin:   Patient denies skin rash/ lesion and itching.  Eyes:   Patient denies blurred vision and double vision.  Ears/ Nose/ Throat:   Patient denies sore throat and sinus problems.  Hematologic/Lymphatic:   Patient denies swollen glands and easy bruising.  Cardiovascular:   Patient denies leg swelling and chest pains.  Respiratory:   Patient denies cough and shortness of breath.  Endocrine:   Patient denies excessive thirst.  Musculoskeletal:   Patient denies back pain and joint pain.  Neurological:   Patient denies headaches and dizziness.  Psychologic:   Patient denies depression and anxiety.   VITAL SIGNS:      12/23/2019 03:15 PM  Weight 129 lb / 58.51 kg  Height 64 in / 162.56 cm  BP 176/92 mmHg  Pulse 90 /min  BMI 22.1 kg/m   MULTI-SYSTEM PHYSICAL EXAMINATION:    Constitutional: Well-nourished. No physical deformities. Normally developed. Good grooming.  Respiratory: No labored breathing, no use of accessory muscles. Clear bilaterally.  Cardiovascular: Normal temperature, normal extremity pulses, no swelling, no  varicosities. Regular rate and rhythm.  Gastrointestinal: No mass, no tenderness, no rigidity, non obese abdomen. No CVA tenderness.     Complexity of Data:  Lab Test Review:   BMP  Records Review:   Previous Patient Records  X-Ray Review: C.T. Abdomen/Pelvis: Reviewed Films.    Notes:                      CLINICAL DATA: Endometrial cancer, s/p hysterectomy and vaginal  brachytherapy, abdominal pain and palpable mass, clinical concern  for recurrence   EXAM:  CT ABDOMEN AND PELVIS WITH CONTRAST   TECHNIQUE:  Multidetector CT imaging of the abdomen and pelvis was performed  using the standard protocol following bolus administration of  intravenous contrast.   CONTRAST: 91mL OMNIPAQUE IOHEXOL 300 MG/ML SOLN, additional oral  enteric contrast   COMPARISON: PET-CT, 01/08/2019   FINDINGS:  Lower chest: No acute abnormality. Fibrotic change in the left lung  base.   Hepatobiliary: No solid liver abnormality is seen. No gallstones,  gallbladder wall thickening, or biliary dilatation.   Pancreas: Unremarkable. No pancreatic ductal dilatation or  surrounding inflammatory changes.   Spleen: Normal in size without significant abnormality.   Adrenals/Urinary Tract: Adrenal glands are unremarkable. Moderate  right hydronephrosis and hydroureter, the distal right ureter  obstructed by a soft tissue mass in the right hemipelvis (series 2,  image 59). Bladder is unremarkable.   Stomach/Bowel: Stomach is within normal limits. Appendix appears  normal. There is new omental and or peritoneal soft tissue  thickening in the ventral abdomen (series 2, image 34).   Vascular/Lymphatic: Aortic atherosclerosis. Possible enlarged left  pelvic sidewall/iliac lymph nodes versus soft tissue nodules as  noted above, no other enlarged lymph nodes in the abdomen or pelvis.   Reproductive: Status post hysterectomy. Soft tissue nodule in the  right hemipelvis measuring 3.4 x 2.6 cm (series 2, image 59). Soft  tissue nodule or left pelvic sidewall/iliac lymph node conglomerate  measuring 2.8 x 2.1 cm (series 2, image 56).   Other: No abdominal wall hernia or abnormality. Moderate volume  ascites throughout the abdomen and pelvis with fine peritoneal  nodularity noted in the pelvis (series 2,  image 64).   Musculoskeletal: No acute or significant osseous findings.   IMPRESSION:  1. Status post hysterectomy. Soft tissue nodule in the right  hemipelvis measuring 3.4 x 2.6 cm. Soft tissue nodule or left pelvic  sidewall/iliac lymph node conglomerate measuring 2.8 x 2.1 cm.  Findings are consistent with recurrent/metastatic disease.  2. Moderate volume ascites throughout the abdomen and pelvis with  fine peritoneal nodularity noted in the pelvis and new omental or  ventral peritoneal soft tissue thickening, consistent with  peritoneal metastatic disease.  3. Moderate right hydronephrosis and hydroureter, the distal right  ureter obstructed by right pelvic soft tissue mass noted above.  4. Aortic Atherosclerosis (ICD10-I70.0).   These results will be called to the ordering clinician or  representative by the Radiologist Assistant, and communication  documented in the PACS or [employer].    Electronically Signed  By: Eddie Candle M.D.  On: 12/15/2019 09:13   Cr 0.74 on 12/08/19   PROCEDURES:          Urinalysis - 81003 Dipstick Dipstick Cont'd  Specimen: Voided Bilirubin: Neg  Color: Yellow Ketones: Neg  Appearance: Clear Blood: Neg  Specific Gravity: 1.025 Protein: Trace  pH: 6.0 Urobilinogen: 0.2  Glucose: Neg Nitrites: Neg    Leukocyte Esterase: Neg  ASSESSMENT:      ICD-10 Details  1 GU:   Ureteral obstruction - N13.1    PLAN:           Schedule Return Visit/Planned Activity: Other See Visit Notes             Note: Will call to schedule surgery.           Document Letter(s):  Created for Patient: Clinical Summary         Notes:   1. Right ureteral obstruction secondary to endometrial malignancy: I had a discussion with her and her daughter, Phebe Colla (via phone), today. I recommended proceeding with cystoscopy and right ureteral stent placement. We discussed this procedure in detail including the potential risks, complications, and the  expected recovery process as well as the potential symptoms related to an indwelling ureteral stent. After discussion, she does give informed consent to proceed. Will try to arrange this for Monday afternoon after her appointments at the [facility] but prior to beginning chemotherapy next Wednesday.   Cc: Dr. Everitt Amber  Dr. [name]  Dr. [name]     * Signed by Erica [name], M.D. on 12/23/19 at 5:34 PM (EDT)*

## 2019-12-28 ENCOUNTER — Encounter (HOSPITAL_COMMUNITY): Payer: Self-pay | Admitting: Urology

## 2019-12-29 ENCOUNTER — Inpatient Hospital Stay (HOSPITAL_BASED_OUTPATIENT_CLINIC_OR_DEPARTMENT_OTHER): Payer: Medicare Other | Admitting: Genetic Counselor

## 2019-12-29 ENCOUNTER — Other Ambulatory Visit: Payer: Self-pay | Admitting: Hematology and Oncology

## 2019-12-29 ENCOUNTER — Ambulatory Visit (HOSPITAL_COMMUNITY)
Admission: RE | Admit: 2019-12-29 | Discharge: 2019-12-29 | Disposition: A | Discharge status: Home / Self Care | Payer: Medicare Other | Attending: Urology | Admitting: Urology

## 2019-12-29 ENCOUNTER — Inpatient Hospital Stay: Payer: Medicare Other

## 2019-12-29 ENCOUNTER — Encounter (HOSPITAL_COMMUNITY)
Admission: RE | Disposition: A | Discharge status: Home / Self Care | Payer: Self-pay | Source: Home / Self Care | Attending: Urology

## 2019-12-29 ENCOUNTER — Ambulatory Visit (HOSPITAL_COMMUNITY): Payer: Medicare Other | Admitting: Physician Assistant

## 2019-12-29 ENCOUNTER — Ambulatory Visit (HOSPITAL_COMMUNITY): Payer: Medicare Other

## 2019-12-29 ENCOUNTER — Other Ambulatory Visit: Payer: Self-pay | Admitting: Genetic Counselor

## 2019-12-29 ENCOUNTER — Ambulatory Visit (HOSPITAL_COMMUNITY): Payer: Medicare Other | Admitting: Anesthesiology

## 2019-12-29 ENCOUNTER — Encounter (HOSPITAL_COMMUNITY): Payer: Self-pay | Admitting: Urology

## 2019-12-29 ENCOUNTER — Other Ambulatory Visit: Payer: Self-pay | Admitting: Oncology

## 2019-12-29 ENCOUNTER — Encounter: Payer: Self-pay | Admitting: Genetic Counselor

## 2019-12-29 ENCOUNTER — Other Ambulatory Visit: Payer: Self-pay

## 2019-12-29 DIAGNOSIS — N135 Crossing vessel and stricture of ureter without hydronephrosis: Secondary | ICD-10-CM | POA: Diagnosis not present

## 2019-12-29 DIAGNOSIS — Z7189 Other specified counseling: Secondary | ICD-10-CM

## 2019-12-29 DIAGNOSIS — Z7952 Long term (current) use of systemic steroids: Secondary | ICD-10-CM | POA: Diagnosis not present

## 2019-12-29 DIAGNOSIS — Z79891 Long term (current) use of opiate analgesic: Secondary | ICD-10-CM | POA: Insufficient documentation

## 2019-12-29 DIAGNOSIS — Z803 Family history of malignant neoplasm of breast: Secondary | ICD-10-CM

## 2019-12-29 DIAGNOSIS — M1990 Unspecified osteoarthritis, unspecified site: Secondary | ICD-10-CM | POA: Insufficient documentation

## 2019-12-29 DIAGNOSIS — D539 Nutritional anemia, unspecified: Secondary | ICD-10-CM

## 2019-12-29 DIAGNOSIS — Z79899 Other long term (current) drug therapy: Secondary | ICD-10-CM | POA: Diagnosis not present

## 2019-12-29 DIAGNOSIS — C541 Malignant neoplasm of endometrium: Secondary | ICD-10-CM

## 2019-12-29 DIAGNOSIS — Z170 Estrogen receptor positive status [ER+]: Secondary | ICD-10-CM

## 2019-12-29 DIAGNOSIS — E039 Hypothyroidism, unspecified: Secondary | ICD-10-CM | POA: Diagnosis not present

## 2019-12-29 DIAGNOSIS — Z853 Personal history of malignant neoplasm of breast: Secondary | ICD-10-CM

## 2019-12-29 DIAGNOSIS — C50511 Malignant neoplasm of lower-outer quadrant of right female breast: Secondary | ICD-10-CM

## 2019-12-29 DIAGNOSIS — Z79890 Hormone replacement therapy (postmenopausal): Secondary | ICD-10-CM | POA: Diagnosis not present

## 2019-12-29 DIAGNOSIS — M7989 Other specified soft tissue disorders: Secondary | ICD-10-CM

## 2019-12-29 HISTORY — PX: CYSTOSCOPY WITH STENT PLACEMENT: SHX5790

## 2019-12-29 LAB — CBC WITH DIFFERENTIAL (CANCER CENTER ONLY)
Abs Immature Granulocytes: 0.01 10*3/uL (ref 0.00–0.07)
Basophils Absolute: 0 10*3/uL (ref 0.0–0.1)
Basophils Relative: 1 %
Eosinophils Absolute: 0 10*3/uL (ref 0.0–0.5)
Eosinophils Relative: 0 %
HCT: 32.7 % — ABNORMAL LOW (ref 36.0–46.0)
Hemoglobin: 10.8 g/dL — ABNORMAL LOW (ref 12.0–15.0)
Immature Granulocytes: 0 %
Lymphocytes Relative: 14 %
Lymphs Abs: 0.8 10*3/uL (ref 0.7–4.0)
MCH: 33.9 pg (ref 26.0–34.0)
MCHC: 33 g/dL (ref 30.0–36.0)
MCV: 102.5 fL — ABNORMAL HIGH (ref 80.0–100.0)
Monocytes Absolute: 0.4 10*3/uL (ref 0.1–1.0)
Monocytes Relative: 8 %
Neutro Abs: 4.3 10*3/uL (ref 1.7–7.7)
Neutrophils Relative %: 77 %
Platelet Count: 309 10*3/uL (ref 150–400)
RBC: 3.19 MIL/uL — ABNORMAL LOW (ref 3.87–5.11)
RDW: 11.9 % (ref 11.5–15.5)
WBC Count: 5.6 10*3/uL (ref 4.0–10.5)
nRBC: 0 % (ref 0.0–0.2)

## 2019-12-29 LAB — CMP (CANCER CENTER ONLY)
ALT: 8 U/L (ref 0–44)
AST: 16 U/L (ref 15–41)
Albumin: 3.8 g/dL (ref 3.5–5.0)
Alkaline Phosphatase: 57 U/L (ref 38–126)
Anion gap: 9 (ref 5–15)
BUN: 5 mg/dL — ABNORMAL LOW (ref 8–23)
CO2: 30 mmol/L (ref 22–32)
Calcium: 10.1 mg/dL (ref 8.9–10.3)
Chloride: 96 mmol/L — ABNORMAL LOW (ref 98–111)
Creatinine: 0.71 mg/dL (ref 0.44–1.00)
GFR, Est AFR Am: >60 mL/min (ref >60)
GFR, Estimated: >60 mL/min (ref >60)
Glucose, Bld: 91 mg/dL (ref 70–99)
Potassium: 3.6 mmol/L (ref 3.5–5.1)
Sodium: 135 mmol/L (ref 135–145)
Total Bilirubin: 0.5 mg/dL (ref 0.3–1.2)
Total Protein: 8 g/dL (ref 6.5–8.1)

## 2019-12-29 LAB — VITAMIN B12: Vitamin B-12: 346 pg/mL (ref 180–914)

## 2019-12-29 LAB — FERRITIN: Ferritin: 32 ng/mL (ref 11–307)

## 2019-12-29 LAB — GENETIC SCREENING ORDER

## 2019-12-29 LAB — IRON AND TIBC
Iron: 67 ug/dL (ref 41–142)
Saturation Ratios: 22 % (ref 21–57)
TIBC: 306 ug/dL (ref 236–444)
UIBC: 239 ug/dL (ref 120–384)

## 2019-12-29 SURGERY — CYSTOSCOPY, WITH STENT INSERTION
Anesthesia: General | Laterality: Right

## 2019-12-29 MED ORDER — FENTANYL CITRATE (PF) 100 MCG/2ML IJ SOLN
25.0000 ug | INTRAMUSCULAR | Status: DC | PRN
  Administered 2019-12-29: 25 ug via INTRAVENOUS

## 2019-12-29 MED ORDER — LIDOCAINE HCL (CARDIAC) PF 100 MG/5ML IV SOSY
PREFILLED_SYRINGE | INTRAVENOUS | Status: DC | PRN
  Administered 2019-12-29: 100 mg via INTRAVENOUS

## 2019-12-29 MED ORDER — ORAL CARE MOUTH RINSE: 15.0000 mL | Freq: Once | OROMUCOSAL | Status: AC

## 2019-12-29 MED ORDER — PROPOFOL 10 MG/ML IV BOLUS
INTRAVENOUS | Status: DC | PRN
  Administered 2019-12-29: 90 mg via INTRAVENOUS

## 2019-12-29 MED ORDER — EPHEDRINE SULFATE-NACL 50-0.9 MG/10ML-% IV SOSY
PREFILLED_SYRINGE | INTRAVENOUS | Status: DC | PRN
  Administered 2019-12-29: 10 mg via INTRAVENOUS

## 2019-12-29 MED ORDER — SODIUM CHLORIDE 0.9 % IR SOLN
Status: DC | PRN
  Administered 2019-12-29: 3000 mL

## 2019-12-29 MED ORDER — LACTATED RINGERS IV SOLN: INTRAVENOUS | Status: DC

## 2019-12-29 MED ORDER — IOHEXOL 300 MG/ML  SOLN
INTRAMUSCULAR | Status: DC | PRN
  Administered 2019-12-29: 14 mL

## 2019-12-29 MED ORDER — MEPERIDINE HCL 50 MG/ML IJ SOLN: 6.2500 mg | INTRAMUSCULAR | Status: DC | PRN

## 2019-12-29 MED ORDER — FENTANYL CITRATE (PF) 100 MCG/2ML IJ SOLN
INTRAMUSCULAR | Status: AC
  Filled 2019-12-29: qty 2

## 2019-12-29 MED ORDER — ONDANSETRON HCL 4 MG/2ML IJ SOLN
INTRAMUSCULAR | Status: DC | PRN
  Administered 2019-12-29: 4 mg via INTRAVENOUS

## 2019-12-29 MED ORDER — CHLORHEXIDINE GLUCONATE 0.12 % MT SOLN
15.0000 mL | Freq: Once | OROMUCOSAL | Status: AC
  Administered 2019-12-29: 15 mL via OROMUCOSAL

## 2019-12-29 MED ORDER — ONDANSETRON HCL 4 MG/2ML IJ SOLN: 4.0000 mg | Freq: Once | INTRAMUSCULAR | Status: DC | PRN

## 2019-12-29 MED ORDER — PROPOFOL 10 MG/ML IV BOLUS
INTRAVENOUS | Status: AC
  Filled 2019-12-29: qty 20

## 2019-12-29 MED ORDER — CEFAZOLIN SODIUM-DEXTROSE 2-4 GM/100ML-% IV SOLN
2.0000 g | INTRAVENOUS | Status: AC
  Administered 2019-12-29: 2 g via INTRAVENOUS
  Filled 2019-12-29: qty 100

## 2019-12-29 MED ORDER — FENTANYL CITRATE (PF) 100 MCG/2ML IJ SOLN
INTRAMUSCULAR | Status: DC | PRN
  Administered 2019-12-29 (×2): 50 ug via INTRAVENOUS

## 2019-12-29 SURGICAL SUPPLY — 14 items
BAG URO CATCHER STRL LF (MISCELLANEOUS) ×3 IMPLANT
CATH INTERMIT  6FR 70CM (CATHETERS) ×5 IMPLANT
CLOTH BEACON ORANGE TIMEOUT ST (SAFETY) ×3 IMPLANT
GLOVE BIOGEL M STRL SZ7.5 (GLOVE) ×3 IMPLANT
GOWN STRL REUS W/TWL LRG LVL3 (GOWN DISPOSABLE) ×6 IMPLANT
GUIDEWIRE ANG ZIPWIRE 038X150 (WIRE) ×2 IMPLANT
GUIDEWIRE STR DUAL SENSOR (WIRE) ×3 IMPLANT
KIT TURNOVER KIT A (KITS) IMPLANT
MANIFOLD NEPTUNE II (INSTRUMENTS) ×3 IMPLANT
PACK CYSTO (CUSTOM PROCEDURE TRAY) ×3 IMPLANT
STENT URETERAL METAL 6X24 (STENTS) ×2 IMPLANT
TUBING CONNECTING 10 (TUBING) ×2 IMPLANT
TUBING CONNECTING 10' (TUBING) ×1
TUBING UROLOGY SET (TUBING) IMPLANT

## 2019-12-29 NOTE — Progress Notes (Signed)
REFERRING PROVIDER: [name], MD [place] [phone]  PRIMARY PROVIDER:  [name], MD  PRIMARY REASON FOR VISIT:  1. Family history of breast cancer   2. Endometrial cancer ([place])   3. Personal history of malignant neoplasm of breast      HISTORY OF PRESENT ILLNESS:   Erica [name], a 70 y.o. female, was seen for a [facility] cancer genetics consultation at the request of Dr. Alvy Bimler due to a personal and family history of cancer.  Erica [name] presents to clinic today to discuss the possibility of a hereditary predisposition to cancer, genetic testing, and to further clarify her future cancer risks, as well as potential cancer risks for family members.   In 2006, at the age of 29, Erica [name] was diagnosed with breast cancer. The treatment plan included lumpectomy, chemotherapy and radiation.  She was put on Tamoxifen and possibly Raloxifene, and was on it for about 10 years per the patient.  In 2020, at the age of 22, Erica [name] was diagnosed with endometrial cancer.  MSI/IHC was normal.     CANCER HISTORY:  Oncology History Overview Note  ER/PR negative, MSI stable   Endometrial cancer ([place])  01/08/2019 Imaging   Hypermetabolic activity within central uterus, consistent with known endometrial carcinoma.   No evidence of local or distant metastatic disease.     01/16/2019 Pathology Results   1. Lymph node, sentinel, biopsy, left obturator - NO CARCINOMA IDENTIFIED IN ONE LYMPH NODE (0/1) - SEE COMMENT 2. Lymph node, sentinel, biopsy, right obturator - NO CARCINOMA IDENTIFIED IN ONE LYMPH NODE (0/1) - SEE COMMENT 3. Uterus +/- tubes/ovaries, neoplastic, cervix, bilateral fallopian tubes and ovaries UTERUS: - ENDOMETRIOID CARCINOMA, FIGO GRADE 3, CONFINED TO THE UTERUS - SEE ONCOLOGY TABLE BELOW CERVIX: - NO CARCINOMA IDENTIFIED BILATERAL OVARIES: - NO CARCINOMA IDENTIFIED BILATERAL FALLOPIAN TUBES: - BENIGN PARATUBAL CYSTS (RIGHT) -  NO CARCINOMA IDENTIFIED Microscopic Comment 1. and 2. Cytokeratin AE1/3 was performed on the sentinel lymph nodes to exclude micrometastasis. There is no evidence of metastatic carcinoma by immunohistochemistry. 3. UTERUS, CARCINOMA OR CARCINOSARCOMA Procedure: Hysterectomy and bilateral salpingo-oophorectomy Histologic type: Endometrioid carcinoma Histologic Grade: FIGO Grade 3 Myometrial invasion: Depth of invasion: 4 mm 1 ofMyometrial thickness: 27 mm Uterine Serosa Involvement: Not identified Cervical stromal involvement: Not identified Extent of involvement of other organs: N/A Lymphovascular invasion: Not identified Regional Lymph Nodes: Examined: 2 Sentinel 0 Non-sentinel 2 Total Lymph nodes with metastasis: N/A Isolated tumor cells (< 0.2 mm): 0 Micrometastasis: (> 0.2 mm and < 2.0 mm): 0 Macrometastasis: (> 2.0 mm): 0 Extracapsular extension: N/A Tumor block for ancillary studies: 3C MMR / MSI testing: Pending Pathologic Stage Classification (pTNM, AJCC 8th edition): pT1a, pN0 FIGO Stage: FIGO Ia   01/16/2019 Surgery   Surgeon: Donaciano Eva  Pre-operative Diagnosis: endometrial cancer grade 3    Operation: Robotic-assisted laparoscopic total hysterectomy with bilateral salpingoophorectomy, SLN biopsy  Operative Findings:  : 9cm uterus (mildly broadened and bulky), normal appearing fallopian tubes and ovaries.       12/15/2019 Imaging   1. Status post hysterectomy. Soft tissue nodule in the right hemipelvis measuring 3.4 x 2.6 cm. Soft tissue nodule or left pelvic sidewall/iliac lymph node conglomerate measuring 2.8 x 2.1 cm. Findings are consistent with recurrent/metastatic disease. 2. Moderate volume ascites throughout the abdomen and pelvis with fine peritoneal nodularity noted in the pelvis and new omental or ventral peritoneal soft tissue thickening, consistent with peritoneal metastatic disease. 3. Moderate  right hydronephrosis and hydroureter, the distal  right ureter obstructed by right pelvic soft tissue mass noted above. 4. Aortic Atherosclerosis (ICD10-I70.0).   12/22/2019 Cancer Staging   Staging form: Corpus Uteri - Carcinoma and Carcinosarcoma, AJCC 8th Edition - Clinical stage from 12/22/2019: FIGO Stage IVB (rcT1a, cN0, cM1) - Signed by [name], MD on 12/22/2019      RISK FACTORS:  Menarche was at age 40.  First live birth at age 28.  OCP use for approximately 0 years.  Ovaries intact: no.  Hysterectomy: yes.  Menopausal status: postmenopausal.  HRT use: 0 years. Colonoscopy: yes; normal. Mammogram within the last year: yes. Number of breast biopsies: 0. Up to date with pelvic exams: yes. Any excessive radiation exposure in the past: yes  Past Medical History:  Diagnosis Date  . Anemia   . Breast cancer ([place])    right/2006  . Bruises easily   . Endometrial cancer ([place])   . Family history of breast cancer   . GERD (gastroesophageal reflux disease)   . History of chemotherapy   . History of kidney infection   . History of radiation therapy   . Hypothyroidism   . Osteoarthritis   . Osteopenia   . Seasonal allergies   . Ureteral obstruction     Past Surgical History:  Procedure Laterality Date  . BACK SURGERY    . BREAST LUMPECTOMY Right 2006   With Lymphnode discetion  . NM I-131 ABLATION W/THYROGEN ([facility] HX)  1970  . OTHER SURGICAL HISTORY  1991   HNP with Fusion   . ROBOTIC ASSISTED TOTAL HYSTERECTOMY WITH BILATERAL SALPINGO OOPHERECTOMY Bilateral 01/16/2019   Procedure: XI ROBOTIC ASSISTED TOTAL HYSTERECTOMY WITH BILATERAL SALPINGO OOPHORECTOMY;  Surgeon: Everitt Amber, MD;  Location: [facility];  Service: Gynecology;  Laterality: Bilateral;  . SENTINEL NODE BIOPSY N/A 01/16/2019   Procedure: SENTINEL LYMPH NODE BIOPSY;  Surgeon: Everitt Amber, MD;  Location: [facility];  Service: Gynecology;  Laterality: N/A;  . TUBAL LIGATION Bilateral 1975    Social History   Socioeconomic History  . Marital status: Widowed     Spouse name: Not on file  . Number of children: 3  . Years of education: Not on file  . Highest education level: Not on file  Occupational History  . Occupation: retired  Tobacco Use  . Smoking status: Never Smoker  . Smokeless tobacco: Never Used  Vaping Use  . Vaping Use: Never used  Substance and Sexual Activity  . Alcohol use: Never  . Drug use: Never  . Sexual activity: Yes    Birth control/protection: Surgical  Other Topics Concern  . Not on file  Social History Narrative  . Not on file   Social Determinants of Health   Financial Resource Strain:   . Difficulty of Paying Living Expenses:   Food Insecurity:   . Worried About Charity fundraiser in the Last Year:   . Arboriculturist in the Last Year:   Transportation Needs:   . Film/video editor (Medical):   [name] Lack of Transportation (Non-Medical):   Physical Activity:   . Days of Exercise per Week:   . Minutes of Exercise per Session:   Stress:   . Feeling of Stress :   Social Connections:   . Frequency of Communication with Friends and Family:   . Frequency of Social Gatherings with Friends and Family:   . Attends Religious Services:   . Active Member of Clubs or Organizations:   .  Attends Archivist Meetings:   [name] Marital Status:      FAMILY HISTORY:  We obtained a detailed, 4-generation family history.  Significant diagnoses are listed below: Family History  Problem Relation Age of Onset  . Hypertension Mother   . Heart disease Mother   . Cirrhosis Father   . Stroke Maternal Uncle   . Breast cancer Half-Sister        bilateral breast cancer dx in her 41s; pat 1/2 sister  . Lung cancer Half-Sister        smoker; pat 1/2 sibling  . Colon cancer Neg Hx   . Liver disease Neg Hx   . Colon polyps Neg Hx     The patient had five children.  Her first daughter died soon after birth, her first son died due to prematurity, and three other children who are now adults.  None are reported to have  cancer.  She has four maternal half sisters and three maternal half brothers, none are reported to have cancer.  Additionally, there are one paternal half brother and five paternal half sisters.  One sister had bilateral breast cancer and another sister had lung cancer.  Both parents are deceased.  The patient's mother died of a heart attack at age 59.  She had two brothers, one where there is no other information on and a second who died of a stroke/heart attack.  The maternal grandparents are deceased from non-cancer related issues.  The patient's father was a [place] Teacher, English as a foreign language and died of complications of diabetes and liver cirrhosis in his 52's-50's.  He had one brother who is cancer free and living in his 71's.  The paternal grandparents are deceased from non-cancer related issues.  Ms. Donson is unaware of previous family history of genetic testing for hereditary cancer risks. Patient's maternal ancestors are of African American descent, and paternal ancestors are of African American descent. There is no reported Ashkenazi Jewish ancestry. There is no known consanguinity.  GENETIC COUNSELING ASSESSMENT: Erica [name] is a 69 y.o. female with a personal and family history of breast cancer which is somewhat suggestive of a predisposition to cancer given the three cases of cancer in the family. We, therefore, discussed and recommended the following at today's visit.   DISCUSSION: We discussed that 5 - 10% of breast cancer is hereditary, with most cases associated with BRCA mutations.  There are other genes that can be associated with hereditary breast cancer syndromes.  These include ATM, CHEK2 and PALB2.  We discussed that uterine cancer can occur as an environmental response to tamoxifen use, or it can be associated with hereditary uterine cancer syndromes.  Based on her family history and ages of onset, the likelihood of this being a hereditary cancer syndrome is low, but with 3 cases she meets criteria  for testing.  We discussed that testing is beneficial for several reasons including knowing how to follow individuals after completing their treatment, identifying whether potential treatment options such as PARP inhibitors would be beneficial, and understand if other family members could be at risk for cancer and allow them to undergo genetic testing.   We reviewed the characteristics, features and inheritance patterns of hereditary cancer syndromes. We also discussed genetic testing, including the appropriate family members to test, the process of testing, insurance coverage and turn-around-time for results. We discussed the implications of a negative, positive, carrier and/or variant of uncertain significant result. We recommended Erica [name] pursue genetic testing for the hereditary cancer  gene panel. The Common Hereditary Gene Panel offered by [facility] includes sequencing and/or deletion duplication testing of the following 48 genes: APC, ATM, AXIN2, BARD1, BMPR1A, BRCA1, BRCA2, BRIP1, CDH1, CDK4, CDKN2A (p14ARF), CDKN2A (p16INK4a), CHEK2, CTNNA1, DICER1, EPCAM (Deletion/duplication testing only), GREM1 (promoter region deletion/duplication testing only), KIT, MEN1, MLH1, MSH2, MSH3, MSH6, MUTYH, NBN, NF1, NHTL1, PALB2, PDGFRA, PMS2, POLD1, POLE, PTEN, RAD50, RAD51C, RAD51D, RNF43, SDHB, SDHC, SDHD, SMAD4, SMARCA4. STK11, TP53, TSC1, TSC2, and VHL.  The following genes were evaluated for sequence changes only: SDHA and HOXB13 c.251G>A variant only.   Based on Erica [name]'s personal and family history of cancer, she meets medical criteria for genetic testing. Despite that she meets criteria, she may still have an out of pocket cost. We discussed that if her out of pocket cost for testing is over $100, the laboratory will call and confirm whether she wants to proceed with testing.  If the out of pocket cost of testing is less than $100 she will be billed by the genetic testing laboratory.   PLAN: After  considering the risks, benefits, and limitations, Erica [name] provided informed consent to pursue genetic testing and the blood sample was sent to [facility] for analysis of the common hereditary cancer panel. Results should be available within approximately 2-3 weeks' time, at which point they will be disclosed by telephone to Erica [name], as will any additional recommendations warranted by these results. Erica [name] will receive a summary of her genetic counseling visit and a copy of her results once available. This information will also be available in Epic.   Lastly, we encouraged Erica [name] to remain in contact with cancer genetics annually so that we can continuously update the family history and inform her of any changes in cancer genetics and testing that may be of benefit for this family.   Erica [name] questions were answered to her satisfaction today. Our contact information was provided should additional questions or concerns arise. Thank you for the referral and allowing [place] to share in the care of your patient.   Jefferson Fullam P. Florene [place], [place], [facility] Licensed, Insurance risk surveyor [name] phone: [phone]  The patient was seen for a total of 45 minutes in face-to-face genetic counseling.  This patient was discussed with Drs. Magrinat, Lindi Adie and/or Burr Medico who agrees with the above.    _______________________________________________________________________ For Office Staff:  Number of people involved in session: 1 Was an Intern/ student involved with case: no

## 2019-12-29 NOTE — Transfer of Care (Signed)
Immediate Anesthesia Transfer of Care Note  Patient: Erica [name]  Procedure(s) Performed: CYSTOSCOPY WITH STENT PLACEMENT (Right )  Patient Location: PACU  Anesthesia Type:General  Level of Consciousness: awake, oriented, patient cooperative and responds to stimulation  Airway & Oxygen Therapy: Patient Spontanous Breathing and Patient connected to face mask oxygen  Post-op Assessment: Report given to RN and Post -op Vital signs reviewed and stable  Post vital signs: Reviewed and stable  Last Vitals:  Vitals Value Taken Time  BP 158/77 12/29/19 1457  Temp    Pulse 83 12/29/19 1458  Resp 16 12/29/19 1458  SpO2 100 % 12/29/19 1458  Vitals shown include unvalidated device data.  Last Pain:  Vitals:   12/29/19 1227  TempSrc:   PainSc: 5       Patients Stated Pain Goal: 4 (93/79/02 [id number])  Complications: No complications documented.

## 2019-12-29 NOTE — Anesthesia Postprocedure Evaluation (Signed)
Anesthesia Post Note  Patient: Erica [name]  Procedure(s) Performed: CYSTOSCOPY WITH STENT PLACEMENT (Right )     Patient location during evaluation: PACU Anesthesia Type: General Level of consciousness: sedated and patient cooperative Pain management: pain level controlled Vital Signs Assessment: post-procedure vital signs reviewed and stable Respiratory status: spontaneous breathing Cardiovascular status: stable Anesthetic complications: no   No complications documented.  Last Vitals:  Vitals:   12/29/19 1615 12/29/19 1701  BP: (!) 169/84 (!) 159/87  Pulse: 70 84  Resp: 12 16  Temp:  36.8 C  SpO2: 96% 100%    Last Pain:  Vitals:   12/29/19 1701  TempSrc: Oral  PainSc: [place]

## 2019-12-29 NOTE — Anesthesia Preprocedure Evaluation (Addendum)
Anesthesia Evaluation  Patient identified by MRN, date of birth, ID band Patient awake    Reviewed: Allergy & Precautions, NPO status , Patient's Chart, lab work & pertinent test results  Airway Mallampati: I       Dental no notable dental hx. (+) Teeth Intact   Pulmonary neg pulmonary ROS,    Pulmonary exam normal breath sounds clear to auscultation       Cardiovascular negative cardio ROS Normal cardiovascular exam Rhythm:Regular Rate:Normal     Neuro/Psych negative neurological ROS  negative psych ROS   GI/Hepatic Neg liver ROS, GERD  ,  Endo/Other  Hypothyroidism   Renal/GU negative Renal ROS     Musculoskeletal  (+) Arthritis ,   Abdominal Normal abdominal exam  (+)   Peds  Hematology  (+) anemia ,   Anesthesia Other Findings   Reproductive/Obstetrics negative OB ROS                             Anesthesia Physical  Anesthesia Plan  ASA: III  Anesthesia Plan: General   Post-op Pain Management:    Induction: Intravenous  PONV Risk Score and Plan: 4 or greater and Ondansetron, Dexamethasone and Treatment may vary due to age or medical condition  Airway Management Planned: LMA  Additional Equipment: None  Intra-op Plan:   Post-operative Plan: Extubation in OR  Informed Consent: I have reviewed the patients History and Physical, chart, labs and discussed the procedure including the risks, benefits and alternatives for the proposed anesthesia with the patient or authorized representative who has indicated his/her understanding and acceptance.     Dental advisory given  Plan Discussed with: CRNA  Anesthesia Plan Comments:        Anesthesia Quick Evaluation

## 2019-12-29 NOTE — Discharge Instructions (Signed)

## 2019-12-29 NOTE — Op Note (Signed)
Preoperative diagnosis:  1. Right ureteral obstruction 2. Metastatic endometrial cancer  Postoperative diagnosis:  1. Right ureteral obstruction 2. Metastatic endometrial cancer  Procedure:  1. Cystoscopy 2. Right ureteral stent placement (6 x 24-no string)  3. Right retrograde pyelography with interpretation  Surgeon: Pryor Curia. M.D.  Anesthesia: General  Complications: None  Intraoperative findings: Right retrograde pyelography was performed using a 6 French ureteral catheter and Omnipaque contrast.  This demonstrated severe deviation of the distal ureter laterally around the patient's pelvic mass.  The distal ureter was clearly compressed extrinsically without intrinsic filling defects.  The ureter was then significantly dilated above the pelvis with some tortuosity extending into a dilated renal collecting system.  EBL: Minimal  Specimens: None  Indication: Erica [name] is a 69 y.o. patient with right ureteral obstruction. After reviewing the management options for treatment, he elected to proceed with the above surgical procedure(s). We have discussed the potential benefits and risks of the procedure, side effects of the proposed treatment, the likelihood of the patient achieving the goals of the procedure, and any potential problems that might occur during the procedure or recuperation. Informed consent has been obtained.  Description of procedure:  The patient was taken to the operating room and general anesthesia was induced.  The patient was placed in the dorsal lithotomy position, prepped and draped in the usual sterile fashion, and preoperative antibiotics were administered. A preoperative time-out was performed.   Cystourethroscopy was performed.  The patient's urethra was examined and was normal. The bladder was then systematically examined in its entirety. There was no evidence for any bladder tumors, stones, or other mucosal pathology.    Attention  then turned to the right ureteral orifice and a ureteral catheter was used to intubate the ureteral orifice.  Omnipaque contrast was injected through the ureteral catheter and a retrograde pyelogram was performed with findings as dictated above.  A 0.38 sensor guidewire was then advanced up the right ureter into the renal pelvis under fluoroscopic guidance.  The wire was then backloaded through the cystoscope and a ureteral stent was advance over the wire using Seldinger technique.  The stent was positioned appropriately under fluoroscopic and cystoscopic guidance.  The wire was then removed with an adequate stent curl noted in the renal pelvis as well as in the bladder.  The bladder was then emptied and the procedure ended.  The patient appeared to tolerate the procedure well and without complications.  The patient was able to be awakened and transferred to the recovery unit in satisfactory condition.    Pryor Curia MD

## 2019-12-29 NOTE — Progress Notes (Signed)
Gynecologic Oncology Multi-Disciplinary Disposition Conference Note  Date of the Conference: 12/29/2019  Patient Name: Erica [name] [facility]  Primary GYN Oncologist: Dr. Denman George  Stage/Disposition:  Stage IA, grade 3 recurrent endometrioid endometrial cancer. Disposition is to salvage chemotherapy with platinum and taxane.   This Multidisciplinary conference took place involving physicians from [place], [place], Radiation Oncology, Pathology, Radiology along with the Gynecologic Oncology Nurse Practitioner and RN.  Comprehensive assessment of the patient's malignancy, staging, need for surgery, chemotherapy, radiation therapy, and need for further testing were reviewed. Supportive measures, both inpatient and following discharge were also discussed. The recommended plan of care is documented. Greater than 35 minutes were spent correlating and coordinating this patient's care.

## 2019-12-29 NOTE — Anesthesia Procedure Notes (Signed)
Procedure Name: LMA Insertion Date/Time: 12/29/2019 2:21 PM Performed by: Silas [place], CRNA Pre-anesthesia Checklist: Patient identified, Emergency Drugs available, Suction available and Patient being monitored Patient Re-evaluated:Patient Re-evaluated prior to induction Oxygen Delivery Method: Circle system utilized Preoxygenation: Pre-oxygenation with 100% oxygen Induction Type: IV induction LMA: LMA inserted LMA Size: 4.0 Tube type: Oral Number of attempts: 1 Placement Confirmation: positive ETCO2 and breath sounds checked- equal and bilateral Tube secured with: Tape Dental Injury: Teeth and Oropharynx as per pre-operative assessment

## 2019-12-29 NOTE — Interval H&P Note (Signed)
History and Physical Interval Note:  12/29/2019 1:35 PM  Erica [name]  has presented today for surgery, with the diagnosis of RIGHT URETERAL OBSTRUCTION.  The various methods of treatment have been discussed with the patient and family. After consideration of risks, benefits and other options for treatment, the patient has consented to  Procedure(s): CYSTOSCOPY WITH STENT PLACEMENT (Right) as a surgical intervention.  The patient's history has been reviewed, patient examined, no change in status, stable for surgery.  I have reviewed the patient's chart and labs.  Questions were answered to the patient's satisfaction.     [employer]

## 2019-12-30 ENCOUNTER — Inpatient Hospital Stay: Payer: Medicare Other

## 2019-12-30 NOTE — Progress Notes (Signed)
Pharmacist Chemotherapy Monitoring - Initial Assessment    Anticipated start date: 8/11/2  Regimen:  . Are orders appropriate based on the patient's diagnosis, regimen, and cycle? Yes . Does the plan date match the patient's scheduled date? Yes . Is the sequencing of drugs appropriate? Yes . Are the premedications appropriate for the patient's regimen? Yes . Prior Authorization for treatment is: Approved o If applicable, is the correct biosimilar selected based on the patient's insurance? not applicable  Organ Function and Labs: [name] Are dose adjustments needed based on the patient's renal function, hepatic function, or hematologic function? No . Are appropriate labs ordered prior to the start of patient's treatment? Yes . Other organ system assessment, if indicated: N/A . The following baseline labs, if indicated, have been ordered: N/A  Dose Assessment: . Are the drug doses appropriate? Yes . Are the following correct: o Drug concentrations Yes o IV fluid compatible with drug Yes o Administration routes Yes o Timing of therapy Yes . If applicable, does the patient have documented access for treatment and/or plans for port-a-cath placement? no . If applicable, have lifetime cumulative doses been properly documented and assessed? yes Lifetime Dose Tracking  No doses have been documented on this patient for the following tracked chemicals: Doxorubicin, Epirubicin, Idarubicin, Daunorubicin, Mitoxantrone, Bleomycin, Oxaliplatin, Carboplatin, Liposomal Doxorubicin  o   Toxicity Monitoring/Prevention: . The patient has the following take home antiemetics prescribed: Ondansetron, Prochlorperazine and Dexamethasone . The patient has the following take home medications prescribed: N/A . Medication allergies and previous infusion related reactions, if applicable, have been reviewed and addressed. Yes . The patient's current medication list has been assessed for drug-drug interactions with  their chemotherapy regimen. no significant drug-drug interactions were identified on review.  Order Review: . Are the treatment plan orders signed? Yes . Is the patient scheduled to see a provider prior to their treatment? Yes  I verify that I have reviewed each item in the above checklist and answered each question accordingly.  Romualdo Bolk [facility] 12/30/2019 2:04 PM

## 2019-12-31 ENCOUNTER — Other Ambulatory Visit: Payer: Self-pay

## 2019-12-31 ENCOUNTER — Inpatient Hospital Stay: Payer: Medicare Other

## 2019-12-31 ENCOUNTER — Encounter: Payer: Self-pay | Admitting: Hematology and Oncology

## 2019-12-31 ENCOUNTER — Inpatient Hospital Stay (HOSPITAL_BASED_OUTPATIENT_CLINIC_OR_DEPARTMENT_OTHER): Payer: Medicare Other | Admitting: Hematology and Oncology

## 2019-12-31 ENCOUNTER — Other Ambulatory Visit: Payer: Medicare Other

## 2019-12-31 VITALS — BP 175/83 | HR 80 | Temp 98.2°F | Resp 18

## 2019-12-31 DIAGNOSIS — C541 Malignant neoplasm of endometrium: Secondary | ICD-10-CM

## 2019-12-31 DIAGNOSIS — M7989 Other specified soft tissue disorders: Secondary | ICD-10-CM

## 2019-12-31 DIAGNOSIS — Z7189 Other specified counseling: Secondary | ICD-10-CM

## 2019-12-31 DIAGNOSIS — G893 Neoplasm related pain (acute) (chronic): Secondary | ICD-10-CM

## 2019-12-31 DIAGNOSIS — D539 Nutritional anemia, unspecified: Secondary | ICD-10-CM | POA: Diagnosis not present

## 2019-12-31 DIAGNOSIS — R030 Elevated blood-pressure reading, without diagnosis of hypertension: Secondary | ICD-10-CM

## 2019-12-31 MED ORDER — SODIUM CHLORIDE 0.9 % IV SOLN
Freq: Once | INTRAVENOUS | Status: AC
  Filled 2019-12-31: qty 250

## 2019-12-31 MED ORDER — DIPHENHYDRAMINE HCL 50 MG/ML IJ SOLN
INTRAMUSCULAR | Status: AC
  Filled 2019-12-31: qty 1

## 2019-12-31 MED ORDER — SODIUM CHLORIDE 0.9 % IV SOLN
150.0000 mg | Freq: Once | INTRAVENOUS | Status: AC
  Administered 2019-12-31: 150 mg via INTRAVENOUS
  Filled 2019-12-31: qty 150

## 2019-12-31 MED ORDER — SODIUM CHLORIDE 0.9 % IV SOLN
175.0000 mg/m2 | Freq: Once | INTRAVENOUS | Status: AC
  Administered 2019-12-31: 282 mg via INTRAVENOUS
  Filled 2019-12-31: qty 47

## 2019-12-31 MED ORDER — FAMOTIDINE IN NACL 20-0.9 MG/50ML-% IV SOLN
20.0000 mg | Freq: Once | INTRAVENOUS | Status: AC
  Administered 2019-12-31: 20 mg via INTRAVENOUS

## 2019-12-31 MED ORDER — SODIUM CHLORIDE 0.9 % IV SOLN
10.0000 mg | Freq: Once | INTRAVENOUS | Status: AC
  Administered 2019-12-31: 10 mg via INTRAVENOUS
  Filled 2019-12-31: qty 10

## 2019-12-31 MED ORDER — PALONOSETRON HCL INJECTION 0.25 MG/5ML
INTRAVENOUS | Status: AC
  Filled 2019-12-31: qty 5

## 2019-12-31 MED ORDER — PALONOSETRON HCL INJECTION 0.25 MG/5ML
0.2500 mg | Freq: Once | INTRAVENOUS | Status: AC
  Administered 2019-12-31: 0.25 mg via INTRAVENOUS

## 2019-12-31 MED ORDER — SODIUM CHLORIDE 0.9 % IV SOLN
436.2000 mg | Freq: Once | INTRAVENOUS | Status: AC
  Administered 2019-12-31: 440 mg via INTRAVENOUS
  Filled 2019-12-31: qty 44

## 2019-12-31 MED ORDER — DIPHENHYDRAMINE HCL 50 MG/ML IJ SOLN
12.5000 mg | Freq: Once | INTRAMUSCULAR | Status: AC
  Administered 2019-12-31: 12.5 mg via INTRAVENOUS

## 2019-12-31 MED ORDER — FAMOTIDINE IN NACL 20-0.9 MG/50ML-% IV SOLN
INTRAVENOUS | Status: AC
  Filled 2019-12-31: qty 50

## 2019-12-31 NOTE — Assessment & Plan Note (Signed)
I have reviewed the CT scan of the chest which came back unremarkable for metastatic disease She looks frail and miserable She has not been taking her pain medicine as prescribed I encouraged her to take pain medicine as needed and will call in 2 days to check on her We will proceed with chemotherapy as scheduled

## 2019-12-31 NOTE — Progress Notes (Signed)
First time 3 hour Taxol, Carbo. Pt completed without complaint. Education and questions addressed.

## 2019-12-31 NOTE — Progress Notes (Signed)
[place] OFFICE PROGRESS NOTE  Patient Care Team: [name], MD as PCP - General (Family Medicine)  ASSESSMENT & PLAN:  Endometrial cancer [facility]) I have reviewed the CT scan of the chest which came back unremarkable for metastatic disease She looks frail and miserable She has not been taking her pain medicine as prescribed I encouraged her to take pain medicine as needed and will call in 2 days to check on her We will proceed with chemotherapy as scheduled  Cancer associated pain She has multifactorial pain, related to her disease and recent urologic procedure She has not been taking oxycodone as prescribed and only taking acetaminophen She is clearly in pain today I recommend she takes her pain medicine as needed  Deficiency anemia I have ordered multiple tests for evaluation Overall, the cause of her anemia is due to anemia chronic disease Observe for now  Left leg swelling I have evaluated recently with ultrasound venous Doppler to rule out DVT The cause of her leg swelling is likely due to her intra-abdominal disease Observe for now  Elevated BP without diagnosis of hypertension The cause of her elevated blood pressure is likely pain It is slightly better compared to her previous visit Observe only for now   No orders of the defined types were placed in this encounter.   All questions were answered. The patient knows to call the clinic with any problems, questions or concerns. The total time spent in the appointment was 20 minutes encounter with patients including review of chart and various tests results, discussions about plan of care and coordination of care plan   [name], MD 12/31/2019 9:02 AM  INTERVAL HISTORY: Please see below for problem oriented charting. She returns for further follow-up She has not been taking her pain medicine as prescribed Her leg swelling is stable Denies nausea or constipation  SUMMARY OF ONCOLOGIC  HISTORY: Oncology History Overview Note  ER/PR negative, MSI stable   Endometrial cancer ([place])  01/08/2019 Imaging   Hypermetabolic activity within central uterus, consistent with known endometrial carcinoma.   No evidence of local or distant metastatic disease.     01/16/2019 Pathology Results   1. Lymph node, sentinel, biopsy, left obturator - NO CARCINOMA IDENTIFIED IN ONE LYMPH NODE (0/1) - SEE COMMENT 2. Lymph node, sentinel, biopsy, right obturator - NO CARCINOMA IDENTIFIED IN ONE LYMPH NODE (0/1) - SEE COMMENT 3. Uterus +/- tubes/ovaries, neoplastic, cervix, bilateral fallopian tubes and ovaries UTERUS: - ENDOMETRIOID CARCINOMA, FIGO GRADE 3, CONFINED TO THE UTERUS - SEE ONCOLOGY TABLE BELOW CERVIX: - NO CARCINOMA IDENTIFIED BILATERAL OVARIES: - NO CARCINOMA IDENTIFIED BILATERAL FALLOPIAN TUBES: - BENIGN PARATUBAL CYSTS (RIGHT) - NO CARCINOMA IDENTIFIED Microscopic Comment 1. and 2. Cytokeratin AE1/3 was performed on the sentinel lymph nodes to exclude micrometastasis. There is no evidence of metastatic carcinoma by immunohistochemistry. 3. UTERUS, CARCINOMA OR CARCINOSARCOMA Procedure: Hysterectomy and bilateral salpingo-oophorectomy Histologic type: Endometrioid carcinoma Histologic Grade: FIGO Grade 3 Myometrial invasion: Depth of invasion: 4 mm 1 ofMyometrial thickness: 27 mm Uterine Serosa Involvement: Not identified Cervical stromal involvement: Not identified Extent of involvement of other organs: N/A Lymphovascular invasion: Not identified Regional Lymph Nodes: Examined: 2 Sentinel 0 Non-sentinel 2 Total Lymph nodes with metastasis: N/A Isolated tumor cells (< 0.2 mm): 0 Micrometastasis: (> 0.2 mm and < 2.0 mm): 0 Macrometastasis: (> 2.0 mm): 0 Extracapsular extension: N/A Tumor block for ancillary studies: 3C MMR / MSI testing: Pending Pathologic Stage Classification (pTNM, AJCC 8th edition): pT1a, pN0 FIGO  Stage: FIGO Ia   01/16/2019 Surgery    Surgeon: Donaciano Eva  Pre-operative Diagnosis: endometrial cancer grade 3    Operation: Robotic-assisted laparoscopic total hysterectomy with bilateral salpingoophorectomy, SLN biopsy  Operative Findings:  : 9cm uterus (mildly broadened and bulky), normal appearing fallopian tubes and ovaries.       12/15/2019 Imaging   1. Status post hysterectomy. Soft tissue nodule in the right hemipelvis measuring 3.4 x 2.6 cm. Soft tissue nodule or left pelvic sidewall/iliac lymph node conglomerate measuring 2.8 x 2.1 cm. Findings are consistent with recurrent/metastatic disease. 2. Moderate volume ascites throughout the abdomen and pelvis with fine peritoneal nodularity noted in the pelvis and new omental or ventral peritoneal soft tissue thickening, consistent with peritoneal metastatic disease. 3. Moderate right hydronephrosis and hydroureter, the distal right ureter obstructed by right pelvic soft tissue mass noted above. 4. Aortic Atherosclerosis (ICD10-I70.0).   12/22/2019 Cancer Staging   Staging form: Corpus Uteri - Carcinoma and Carcinosarcoma, AJCC 8th Edition - Clinical stage from 12/22/2019: FIGO Stage IVB (rcT1a, cN0, cM1) - Signed by [name], MD on 12/22/2019   12/24/2019 Imaging   CT chest 1. No evidence of metastatic disease in the chest. 2. Stable 3 mm pulmonary nodule of the peripheral right lower lobe, almost certainly incidental and benign. Attention on follow-up.  3. Scarring of the left lung base. 4. Aortic Atherosclerosis (ICD10-I70.0).   12/29/2019 Procedure   Preoperative diagnosis:  1. Right ureteral obstruction 2. Metastatic endometrial cancer   Postoperative diagnosis:  1. Right ureteral obstruction 2. Metastatic endometrial cancer   Procedure:   1. Cystoscopy 2. Right ureteral stent placement (6 x 24-no string)  3. Right retrograde pyelography with interpretation   Surgeon: Pryor Curia. M.D.    Intraoperative findings: Right retrograde pyelography  was performed using a 6 French ureteral catheter and Omnipaque contrast.  This demonstrated severe deviation of the distal ureter laterally around the patient's pelvic mass.  The distal ureter was clearly compressed extrinsically without intrinsic filling defects.  The ureter was then significantly dilated above the pelvis with some tortuosity extending into a dilated renal collecting system.     REVIEW OF SYSTEMS:   Constitutional: Denies fevers, chills or abnormal weight loss Eyes: Denies blurriness of vision Ears, nose, mouth, throat, and face: Denies mucositis or sore throat Respiratory: Denies cough, dyspnea or wheezes Cardiovascular: Denies palpitation, chest discomfort Gastrointestinal:  Denies nausea, heartburn or change in bowel habits Skin: Denies abnormal skin rashes Lymphatics: Denies new lymphadenopathy or easy bruising Neurological:Denies numbness, tingling or new weaknesses Behavioral/Psych: Mood is stable, no new changes  All other systems were reviewed with the patient and are negative.  I have reviewed the past medical history, past surgical history, social history and family history with the patient and they are unchanged from previous note.  ALLERGIES:  is allergic to gadolinium and other.  MEDICATIONS:  Current Outpatient Medications  Medication Sig Dispense Refill  . acetaminophen (TYLENOL) 500 MG tablet Take 500 mg by mouth every 6 (six) hours as needed for moderate pain.     [name] alendronate (FOSAMAX) 70 MG tablet Take 70 mg by mouth once a week.     . Calcium Carb-Cholecalciferol (CALCIUM 600+D3) 600-200 MG-UNIT TABS Take 600 mg by mouth daily.     . Capsaicin (ARTHRITIS PAIN RELIEF EX) Apply 1 application topically daily as needed (Arthritis pain).    [name] dexamethasone (DECADRON) 4 MG tablet Take 2 tabs at the night before and 2 tab the morning  of chemotherapy, every 3 weeks, by mouth x 6 cycles 36 tablet 6  . Docusate Sodium (STOOL SOFTENER) 100 MG capsule Take 100  mg by mouth daily as needed for constipation.    . fluticasone (FLONASE) 50 MCG/ACT nasal spray Place 1 spray into both nostrils daily as needed for allergies or rhinitis.     . hydrocortisone cream 0.5 % Apply 1 application topically daily as needed for itching.    . levothyroxine (SYNTHROID) 88 MCG tablet Take 88 mcg by mouth every morning.    . loratadine (CLARITIN) 10 MG tablet Take 10 mg by mouth daily.    . Multiple Vitamin (MULTIVITAMIN) tablet Take 1 tablet by mouth daily.    . naphazoline-pheniramine (ALLERGY EYE) 0.025-0.3 % ophthalmic solution Place 1 drop into both eyes daily as needed for allergies.    [name] ondansetron (ZOFRAN) 8 MG tablet Take 1 tablet (8 mg total) by mouth every 8 (eight) hours as needed. Start on the third day after chemotherapy. 30 tablet 1  . oxyCODONE (OXY IR/ROXICODONE) 5 MG immediate release tablet Take 1 tablet (5 mg total) by mouth every 4 (four) hours as needed for severe pain. 50 tablet 0  . prochlorperazine (COMPAZINE) 10 MG tablet Take 1 tablet (10 mg total) by mouth every 6 (six) hours as needed (Nausea or vomiting). 30 tablet 1  . senna-docusate (SENOKOT-S) 8.6-50 MG tablet Take 2 tablets by mouth at bedtime. For AFTER surgery, do not take if having diarrhea (Patient taking differently: Take 2 tablets by mouth at bedtime as needed for mild constipation or moderate constipation. ) 30 tablet 0   No current facility-administered medications for this visit.    PHYSICAL EXAMINATION: ECOG PERFORMANCE STATUS: 2 - Symptomatic, <50% confined to bed  Vitals:   12/31/19 0843  BP: (!) 162/74  Pulse: 86  Resp: 18  Temp: (!) 97.5 F (36.4 C)  SpO2: 99%   Filed Weights   12/31/19 0843  Weight: 130 lb 6.4 oz (59.1 kg)    GENERAL:alert, in mild distress from pain HEART: stable bilateral lower extremity edema ABDOMEN:abdomen soft NEURO: alert & oriented x 3 with fluent speech, no focal motor/sensory deficits  LABORATORY DATA:  I have reviewed the data as  listed    Component Value Date/Time   NA 135 12/29/2019 1038   NA 142 12/08/2014 1153   K 3.6 12/29/2019 1038   K 3.7 12/08/2014 1153   CL 96 (L) 12/29/2019 1038   CO2 30 12/29/2019 1038   CO2 29 12/08/2014 1153   GLUCOSE 91 12/29/2019 1038   GLUCOSE 84 12/08/2014 1153   BUN 5 (L) 12/29/2019 1038   BUN 8.6 12/08/2014 1153   CREATININE 0.71 12/29/2019 1038   CREATININE 0.7 12/08/2014 1153   CALCIUM 10.1 12/29/2019 1038   CALCIUM 9.3 12/08/2014 1153   PROT 8.0 12/29/2019 1038   PROT 7.2 12/08/2014 1153   ALBUMIN 3.8 12/29/2019 1038   ALBUMIN 3.8 12/08/2014 1153   AST 16 12/29/2019 1038   AST 21 12/08/2014 1153   ALT 8 12/29/2019 1038   ALT 9 12/08/2014 1153   ALKPHOS 57 12/29/2019 1038   ALKPHOS 63 12/08/2014 1153   BILITOT 0.5 12/29/2019 1038   BILITOT 0.56 12/08/2014 1153   GFRNONAA >60 12/29/2019 1038   GFRAA >60 12/29/2019 1038    No results found for: SPEP, UPEP  Lab Results  Component Value Date   WBC 5.6 12/29/2019   NEUTROABS 4.3 12/29/2019   HGB 10.8 (L) 12/29/2019  HCT 32.7 (L) 12/29/2019   MCV 102.5 (H) 12/29/2019   PLT 309 12/29/2019      Chemistry      Component Value Date/Time   NA 135 12/29/2019 1038   NA 142 12/08/2014 1153   K 3.6 12/29/2019 1038   K 3.7 12/08/2014 1153   CL 96 (L) 12/29/2019 1038   CO2 30 12/29/2019 1038   CO2 29 12/08/2014 1153   BUN 5 (L) 12/29/2019 1038   BUN 8.6 12/08/2014 1153   CREATININE 0.71 12/29/2019 1038   CREATININE 0.7 12/08/2014 1153      Component Value Date/Time   CALCIUM 10.1 12/29/2019 1038   CALCIUM 9.3 12/08/2014 1153   ALKPHOS 57 12/29/2019 1038   ALKPHOS 63 12/08/2014 1153   AST 16 12/29/2019 1038   AST 21 12/08/2014 1153   ALT 8 12/29/2019 1038   ALT 9 12/08/2014 1153   BILITOT 0.5 12/29/2019 1038   BILITOT 0.56 12/08/2014 1153       RADIOGRAPHIC STUDIES: I have personally reviewed the radiological images as listed and agreed with the findings in the report. CT Chest W  Contrast  Result Date: 12/25/2019 CLINICAL DATA:  Stage IV endometrial cancer, abdominal recurrence, evaluate for thoracic metastatic disease EXAM: CT CHEST WITH CONTRAST TECHNIQUE: Multidetector CT imaging of the chest was performed during intravenous contrast administration. CONTRAST:  144m OMNIPAQUE IOHEXOL 300 MG/ML  SOLN COMPARISON:  CT abdomen pelvis, 12/17/2019, PET-CT, 01/08/2019 FINDINGS: Cardiovascular: Aortic atherosclerosis. Normal heart size. No pericardial effusion. Mediastinum/Nodes: No enlarged mediastinal, hilar, or axillary lymph nodes. Thyroid gland, trachea, and esophagus demonstrate no significant findings. Lungs/Pleura: Stable, benign scarring and nodularity right apex (series 5, image 17). Stable 3 mm pulmonary nodule of the peripheral right lower lobe (series 5, image 86). There are scattered small cysts or pneumatoceles and scarring of the left lung base. No pleural effusion or pneumothorax. Upper Abdomen: No acute abnormality. Moderate volume ascites in the included upper abdomen. Musculoskeletal: No chest wall mass or suspicious bone lesions identified. IMPRESSION: 1. No evidence of metastatic disease in the chest. 2. Stable 3 mm pulmonary nodule of the peripheral right lower lobe, almost certainly incidental and benign. Attention on follow-up. 3. Scarring of the left lung base. 4. Aortic Atherosclerosis (ICD10-I70.0). Electronically Signed   By: AEddie CandleM.D.   On: 12/25/2019 09:54   CT Abdomen Pelvis W Contrast  Result Date: 12/15/2019 CLINICAL DATA:  Endometrial cancer, s/p hysterectomy and vaginal brachytherapy, abdominal pain and palpable mass, clinical concern for recurrence EXAM: CT ABDOMEN AND PELVIS WITH CONTRAST TECHNIQUE: Multidetector CT imaging of the abdomen and pelvis was performed using the standard protocol following bolus administration of intravenous contrast. CONTRAST:  843mOMNIPAQUE IOHEXOL 300 MG/ML SOLN, additional oral enteric contrast COMPARISON:   PET-CT, 01/08/2019 FINDINGS: Lower chest: No acute abnormality. Fibrotic change in the left lung base. Hepatobiliary: No solid liver abnormality is seen. No gallstones, gallbladder wall thickening, or biliary dilatation. Pancreas: Unremarkable. No pancreatic ductal dilatation or surrounding inflammatory changes. Spleen: Normal in size without significant abnormality. Adrenals/Urinary Tract: Adrenal glands are unremarkable. Moderate right hydronephrosis and hydroureter, the distal right ureter obstructed by a soft tissue mass in the right hemipelvis (series 2, image 59). Bladder is unremarkable. Stomach/Bowel: Stomach is within normal limits. Appendix appears normal. There is new omental and or peritoneal soft tissue thickening in the ventral abdomen (series 2, image 34). Vascular/Lymphatic: Aortic atherosclerosis. Possible enlarged left pelvic sidewall/iliac lymph nodes versus soft tissue nodules as noted above, no other enlarged lymph  nodes in the abdomen or pelvis. Reproductive: Status post hysterectomy. Soft tissue nodule in the right hemipelvis measuring 3.4 x 2.6 cm (series 2, image 59). Soft tissue nodule or left pelvic sidewall/iliac lymph node conglomerate measuring 2.8 x 2.1 cm (series 2, image 56). Other: No abdominal wall hernia or abnormality. Moderate volume ascites throughout the abdomen and pelvis with fine peritoneal nodularity noted in the pelvis (series 2, image 64). Musculoskeletal: No acute or significant osseous findings. IMPRESSION: 1. Status post hysterectomy. Soft tissue nodule in the right hemipelvis measuring 3.4 x 2.6 cm. Soft tissue nodule or left pelvic sidewall/iliac lymph node conglomerate measuring 2.8 x 2.1 cm. Findings are consistent with recurrent/metastatic disease. 2. Moderate volume ascites throughout the abdomen and pelvis with fine peritoneal nodularity noted in the pelvis and new omental or ventral peritoneal soft tissue thickening, consistent with peritoneal metastatic  disease. 3. Moderate right hydronephrosis and hydroureter, the distal right ureter obstructed by right pelvic soft tissue mass noted above. 4. Aortic Atherosclerosis (ICD10-I70.0). These results will be called to the ordering clinician or representative by the Radiologist Assistant, and communication documented in the PACS or [employer]. Electronically Signed   By: Eddie Candle M.D.   On: 12/15/2019 09:13   DG C-Arm 1-60 Min-No Report  Result Date: 12/29/2019 Fluoroscopy was utilized by the requesting physician.  No radiographic interpretation.   VAS [place] LOWER EXTREMITY VENOUS (DVT)  Result Date: 12/23/2019  Lower Venous DVTStudy Indications: Edema.  Risk Factors: Cancer : Endometrial. Limitations: Poor ultrasound/tissue interface. Comparison Study: No prior study Performing Technologist: Maudry Mayhew MHA, RDMS, RVT, RDCS  Examination Guidelines: A complete evaluation includes B-mode imaging, spectral Doppler, color Doppler, and power Doppler as needed of all accessible portions of each vessel. Bilateral testing is considered an integral part of a complete examination. Limited examinations for reoccurring indications may be performed as noted. The reflux portion of the exam is performed with the patient in reverse Trendelenburg.  +---------+---------------+---------+-----------+----------+--------------+ RIGHT    CompressibilityPhasicitySpontaneityPropertiesThrombus Aging +---------+---------------+---------+-----------+----------+--------------+ CFV      Full           Yes      Yes                                 +---------+---------------+---------+-----------+----------+--------------+ SFJ      Full                                                        +---------+---------------+---------+-----------+----------+--------------+ FV Prox  Full                                                         +---------+---------------+---------+-----------+----------+--------------+ FV Mid   Full                                                        +---------+---------------+---------+-----------+----------+--------------+ FV DistalFull                                                        +---------+---------------+---------+-----------+----------+--------------+  PFV      Full                                                        +---------+---------------+---------+-----------+----------+--------------+ POP      Full           Yes      Yes                                 +---------+---------------+---------+-----------+----------+--------------+ PTV      Full                                                        +---------+---------------+---------+-----------+----------+--------------+ PERO     Full                                                        +---------+---------------+---------+-----------+----------+--------------+   Right Technical Findings: Not visualized segments include Limited evaluation PTV, peroneal veins.  +---------+---------------+---------+-----------+----------+--------------+ LEFT     CompressibilityPhasicitySpontaneityPropertiesThrombus Aging +---------+---------------+---------+-----------+----------+--------------+ CFV      Full           Yes      Yes                                 +---------+---------------+---------+-----------+----------+--------------+ SFJ      Full                                                        +---------+---------------+---------+-----------+----------+--------------+ FV Prox  Full                                                        +---------+---------------+---------+-----------+----------+--------------+ FV Mid   Full                                                        +---------+---------------+---------+-----------+----------+--------------+ FV DistalFull                                                         +---------+---------------+---------+-----------+----------+--------------+ PFV      Full                                                        +---------+---------------+---------+-----------+----------+--------------+  POP      Full           Yes      Yes                                 +---------+---------------+---------+-----------+----------+--------------+ PTV      Full                                                        +---------+---------------+---------+-----------+----------+--------------+ PERO     Full                                                        +---------+---------------+---------+-----------+----------+--------------+   Left Technical Findings: Not visualized segments include Limited evaluation PTV, peroneal veins.   Summary: RIGHT: - There is no evidence of deep vein thrombosis in the lower extremity. However, portions of this examination were limited- see technologist comments above.  - No cystic structure found in the popliteal fossa.  LEFT: - There is no evidence of deep vein thrombosis in the lower extremity. However, portions of this examination were limited- see technologist comments above.  - No cystic structure found in the popliteal fossa.  *See table(s) above for measurements and observations. Electronically signed by Deitra Mayo MD on 12/23/2019 at 11:03:22 AM.    Final

## 2019-12-31 NOTE — Assessment & Plan Note (Signed)
I have evaluated recently with ultrasound venous Doppler to rule out DVT The cause of her leg swelling is likely due to her intra-abdominal disease Observe for now

## 2019-12-31 NOTE — Assessment & Plan Note (Signed)
She has multifactorial pain, related to her disease and recent urologic procedure She has not been taking oxycodone as prescribed and only taking acetaminophen She is clearly in pain today I recommend she takes her pain medicine as needed

## 2019-12-31 NOTE — Assessment & Plan Note (Signed)
I have ordered multiple tests for evaluation Overall, the cause of her anemia is due to anemia chronic disease Observe for now

## 2019-12-31 NOTE — Patient Instructions (Signed)
[facility] Discharge Instructions for Patients Receiving Chemotherapy  Today you received the following chemotherapy agents Taxol, Carbo  To help prevent nausea and vomiting after your treatment, we encourage you to take your nausea medication.DO NOT TAKE ZOFRAN FOR THREE DAYS AFTER TREATMENT.   If you develop nausea and vomiting that is not controlled by your nausea medication, call the clinic.   BELOW ARE SYMPTOMS THAT SHOULD BE REPORTED IMMEDIATELY:  *FEVER GREATER THAN 100.5 F  *CHILLS WITH OR WITHOUT FEVER  NAUSEA AND VOMITING THAT IS NOT CONTROLLED WITH YOUR NAUSEA MEDICATION  *UNUSUAL SHORTNESS OF BREATH  *UNUSUAL BRUISING OR BLEEDING  TENDERNESS IN MOUTH AND THROAT WITH OR WITHOUT PRESENCE OF ULCERS  *URINARY PROBLEMS  *BOWEL PROBLEMS  UNUSUAL RASH Items with * indicate a potential emergency and should be followed up as soon as possible.  Feel free to call the clinic should you have any questions or concerns. The clinic phone number is [phone].  Please show the [facility] at check-in to the Emergency Department and triage nurse.  Carboplatin injection What is this medicine? CARBOPLATIN (KAR boe pla tin) is a chemotherapy drug. It targets fast dividing cells, like cancer cells, and causes these cells to die. This medicine is used to treat ovarian cancer and many other cancers. This medicine may be used for other purposes; ask your health care provider or pharmacist if you have questions. COMMON BRAND NAME(S): Paraplatin What should I tell my health care provider before I take this medicine? They need to know if you have any of these conditions:  blood disorders  hearing problems  kidney disease  recent or ongoing radiation therapy  an unusual or allergic reaction to carboplatin, cisplatin, other chemotherapy, other medicines, foods, dyes, or preservatives  pregnant or trying to get pregnant  breast-feeding How should I use this  medicine? This drug is usually given as an infusion into a vein. It is administered in a hospital or clinic by a specially trained health care professional. Talk to your pediatrician regarding the use of this medicine in children. Special care may be needed. Overdosage: If you think you have taken too much of this medicine contact a poison control center or emergency room at once. NOTE: This medicine is only for you. Do not share this medicine with others. What if I miss a dose? It is important not to miss a dose. Call your doctor or health care professional if you are unable to keep an appointment. What may interact with this medicine?  medicines for seizures  medicines to increase blood counts like filgrastim, pegfilgrastim, sargramostim  some antibiotics like amikacin, gentamicin, neomycin, streptomycin, tobramycin  vaccines Talk to your doctor or health care professional before taking any of these medicines:  acetaminophen  aspirin  ibuprofen  ketoprofen  naproxen This list may not describe all possible interactions. Give your health care provider a list of all the medicines, herbs, non-prescription drugs, or dietary supplements you use. Also tell them if you smoke, drink alcohol, or use illegal drugs. Some items may interact with your medicine. What should I watch for while using this medicine? Your condition will be monitored carefully while you are receiving this medicine. You will need important blood work done while you are taking this medicine. This drug may make you feel generally unwell. This is not uncommon, as chemotherapy can affect healthy cells as well as cancer cells. Report any side effects. Continue your course of treatment even though you feel ill  unless your doctor tells you to stop. In some cases, you may be given additional medicines to help with side effects. Follow all directions for their use. Call your doctor or health care professional for advice if you  get a fever, chills or sore throat, or other symptoms of a cold or flu. Do not treat yourself. This drug decreases your body's ability to fight infections. Try to avoid being around people who are sick. This medicine may increase your risk to bruise or bleed. Call your doctor or health care professional if you notice any unusual bleeding. Be careful brushing and flossing your teeth or using a toothpick because you may get an infection or bleed more easily. If you have any dental work done, tell your dentist you are receiving this medicine. Avoid taking products that contain aspirin, acetaminophen, ibuprofen, naproxen, or ketoprofen unless instructed by your doctor. These medicines may hide a fever. Do not become pregnant while taking this medicine. Women should inform their doctor if they wish to become pregnant or think they might be pregnant. There is a potential for serious side effects to an unborn child. Talk to your health care professional or pharmacist for more information. Do not breast-feed an infant while taking this medicine. What side effects may I notice from receiving this medicine? Side effects that you should report to your doctor or health care professional as soon as possible:  allergic reactions like skin rash, itching or hives, swelling of the face, lips, or tongue  signs of infection - fever or chills, cough, sore throat, pain or difficulty passing urine  signs of decreased platelets or bleeding - bruising, pinpoint red spots on the skin, black, tarry stools, nosebleeds  signs of decreased red blood cells - unusually weak or tired, fainting spells, lightheadedness  breathing problems  changes in hearing  changes in vision  chest pain  high blood pressure  low blood counts - This drug may decrease the number of white blood cells, red blood cells and platelets. You may be at increased risk for infections and bleeding.  nausea and vomiting  pain, swelling, redness or  irritation at the injection site  pain, tingling, numbness in the hands or feet  problems with balance, talking, walking  trouble passing urine or change in the amount of urine Side effects that usually do not require medical attention (report to your doctor or health care professional if they continue or are bothersome):  hair loss  loss of appetite  metallic taste in the mouth or changes in taste This list may not describe all possible side effects. Call your doctor for medical advice about side effects. You may report side effects to FDA at 1-800-FDA-1088. Where should I keep my medicine? This drug is given in a hospital or clinic and will not be stored at home. NOTE: This sheet is a summary. It may not cover all possible information. If you have questions about this medicine, talk to your doctor, pharmacist, or health care provider.  2020 Elsevier/Gold Standard (2007-08-13 14:38:05) Paclitaxel injection What is this medicine? PACLITAXEL (PAK li TAX el) is a chemotherapy drug. It targets fast dividing cells, like cancer cells, and causes these cells to die. This medicine is used to treat ovarian cancer, breast cancer, lung cancer, Kaposi's sarcoma, and other cancers. This medicine may be used for other purposes; ask your health care provider or pharmacist if you have questions. COMMON BRAND NAME(S): Onxol, Taxol What should I tell my health care provider before I  take this medicine? They need to know if you have any of these conditions:  history of irregular heartbeat  liver disease  low blood counts, like low white cell, platelet, or red cell counts  lung or breathing disease, like asthma  tingling of the fingers or toes, or other nerve disorder  an unusual or allergic reaction to paclitaxel, alcohol, polyoxyethylated castor oil, other chemotherapy, other medicines, foods, dyes, or preservatives  pregnant or trying to get pregnant  breast-feeding How should I use this  medicine? This drug is given as an infusion into a vein. It is administered in a hospital or clinic by a specially trained health care professional. Talk to your pediatrician regarding the use of this medicine in children. Special care may be needed. Overdosage: If you think you have taken too much of this medicine contact a poison control center or emergency room at once. NOTE: This medicine is only for you. Do not share this medicine with others. What if I miss a dose? It is important not to miss your dose. Call your doctor or health care professional if you are unable to keep an appointment. What may interact with this medicine? Do not take this medicine with any of the following medications:  disulfiram  metronidazole This medicine may also interact with the following medications:  antiviral medicines for hepatitis, HIV or AIDS  certain antibiotics like erythromycin and clarithromycin  certain medicines for fungal infections like ketoconazole and itraconazole  certain medicines for seizures like carbamazepine, phenobarbital, phenytoin  gemfibrozil  nefazodone  rifampin  St. John's wort This list may not describe all possible interactions. Give your health care provider a list of all the medicines, herbs, non-prescription drugs, or dietary supplements you use. Also tell them if you smoke, drink alcohol, or use illegal drugs. Some items may interact with your medicine. What should I watch for while using this medicine? Your condition will be monitored carefully while you are receiving this medicine. You will need important blood work done while you are taking this medicine. This medicine can cause serious allergic reactions. To reduce your risk you will need to take other medicine(s) before treatment with this medicine. If you experience allergic reactions like skin rash, itching or hives, swelling of the face, lips, or tongue, tell your doctor or health care professional right  away. In some cases, you may be given additional medicines to help with side effects. Follow all directions for their use. This drug may make you feel generally unwell. This is not uncommon, as chemotherapy can affect healthy cells as well as cancer cells. Report any side effects. Continue your course of treatment even though you feel ill unless your doctor tells you to stop. Call your doctor or health care professional for advice if you get a fever, chills or sore throat, or other symptoms of a cold or flu. Do not treat yourself. This drug decreases your body's ability to fight infections. Try to avoid being around people who are sick. This medicine may increase your risk to bruise or bleed. Call your doctor or health care professional if you notice any unusual bleeding. Be careful brushing and flossing your teeth or using a toothpick because you may get an infection or bleed more easily. If you have any dental work done, tell your dentist you are receiving this medicine. Avoid taking products that contain aspirin, acetaminophen, ibuprofen, naproxen, or ketoprofen unless instructed by your doctor. These medicines may hide a fever. Do not become pregnant while taking  this medicine. Women should inform their doctor if they wish to become pregnant or think they might be pregnant. There is a potential for serious side effects to an unborn child. Talk to your health care professional or pharmacist for more information. Do not breast-feed an infant while taking this medicine. Men are advised not to father a child while receiving this medicine. This product may contain alcohol. Ask your pharmacist or healthcare provider if this medicine contains alcohol. Be sure to tell all healthcare providers you are taking this medicine. Certain medicines, like metronidazole and disulfiram, can cause an unpleasant reaction when taken with alcohol. The reaction includes flushing, headache, nausea, vomiting, sweating, and  increased thirst. The reaction can last from 30 minutes to several hours. What side effects may I notice from receiving this medicine? Side effects that you should report to your doctor or health care professional as soon as possible:  allergic reactions like skin rash, itching or hives, swelling of the face, lips, or tongue  breathing problems  changes in vision  fast, irregular heartbeat  high or low blood pressure  mouth sores  pain, tingling, numbness in the hands or feet  signs of decreased platelets or bleeding - bruising, pinpoint red spots on the skin, black, tarry stools, blood in the urine  signs of decreased red blood cells - unusually weak or tired, feeling faint or lightheaded, falls  signs of infection - fever or chills, cough, sore throat, pain or difficulty passing urine  signs and symptoms of liver injury like dark yellow or brown urine; general ill feeling or flu-like symptoms; light-colored stools; loss of appetite; nausea; right upper belly pain; unusually weak or tired; yellowing of the eyes or skin  swelling of the ankles, feet, hands  unusually slow heartbeat Side effects that usually do not require medical attention (report to your doctor or health care professional if they continue or are bothersome):  diarrhea  hair loss  loss of appetite  muscle or joint pain  nausea, vomiting  pain, redness, or irritation at site where injected  tiredness This list may not describe all possible side effects. Call your doctor for medical advice about side effects. You may report side effects to FDA at 1-800-FDA-1088. Where should I keep my medicine? This drug is given in a hospital or clinic and will not be stored at home. NOTE: This sheet is a summary. It may not cover all possible information. If you have questions about this medicine, talk to your doctor, pharmacist, or health care provider.  2020 Elsevier/Gold Standard (2017-01-09 13:14:55)

## 2019-12-31 NOTE — Assessment & Plan Note (Signed)
The cause of her elevated blood pressure is likely pain It is slightly better compared to her previous visit Observe only for now

## 2020-01-01 ENCOUNTER — Encounter (HOSPITAL_COMMUNITY): Payer: Self-pay | Admitting: Urology

## 2020-01-02 ENCOUNTER — Encounter (HOSPITAL_COMMUNITY): Payer: Self-pay | Admitting: Urology

## 2020-01-02 ENCOUNTER — Telehealth: Payer: Self-pay

## 2020-01-02 NOTE — Telephone Encounter (Signed)
-----   Message from [name], MD sent at 01/02/2020  7:59 AM EDT ----- Regarding: can you call and ask how is she doing in regards to pain

## 2020-01-02 NOTE — Telephone Encounter (Signed)
Called and given below message. She verbalized understanding. She had no pain yesterday and did not take anything for pain. She is just getting up and she is complaining of a little pelvic pain today. She will eat and take tylenol for the pain. Instructed to call the office if needed. She verbalized understanding.

## 2020-01-06 ENCOUNTER — Telehealth: Payer: Self-pay | Admitting: Oncology

## 2020-01-06 NOTE — Telephone Encounter (Signed)
Called Tammy (daughter) regarding FMLA paperwork.  She would like intermittent leave to start 12/29/19 through 03/21/2020.  She would also like the completed paperwork to be faxed to [employer].

## 2020-01-08 ENCOUNTER — Telehealth: Payer: Self-pay

## 2020-01-08 ENCOUNTER — Telehealth: Payer: Self-pay | Admitting: Genetic Counselor

## 2020-01-08 ENCOUNTER — Encounter: Payer: Self-pay | Admitting: Genetic Counselor

## 2020-01-08 ENCOUNTER — Telehealth: Payer: Self-pay | Admitting: *Deleted

## 2020-01-08 ENCOUNTER — Ambulatory Visit: Payer: Self-pay | Admitting: Genetic Counselor

## 2020-01-08 DIAGNOSIS — Z1379 Encounter for other screening for genetic and chromosomal anomalies: Secondary | ICD-10-CM

## 2020-01-08 NOTE — Progress Notes (Signed)
HPI:  Erica [name] was previously seen in the [place] clinic due to a personal and family history of cancer and concerns regarding a hereditary predisposition to cancer. Please refer to our prior cancer genetics clinic note for more information regarding our discussion, assessment and recommendations, at the time. Erica [name] recent genetic test results were disclosed to her, as were recommendations warranted by these results. These results and recommendations are discussed in more detail below.  CANCER HISTORY:  Oncology History Overview Note  ER/PR negative, MSI stable   Breast cancer, right breast ([place])  03/13/2014 Initial Diagnosis   Breast cancer, right breast ([place])   01/07/2020 Genetic Testing   Negative genetic testing.  RAD50 c.1556C>T VUS identified.  The Common Hereditary Gene Panel offered by [facility] includes sequencing and/or deletion duplication testing of the following 48 genes: APC, ATM, AXIN2, BARD1, BMPR1A, BRCA1, BRCA2, BRIP1, CDH1, CDK4, CDKN2A (p14ARF), CDKN2A (p16INK4a), CHEK2, CTNNA1, DICER1, EPCAM (Deletion/duplication testing only), GREM1 (promoter region deletion/duplication testing only), KIT, MEN1, MLH1, MSH2, MSH3, MSH6, MUTYH, NBN, NF1, NHTL1, PALB2, PDGFRA, PMS2, POLD1, POLE, PTEN, RAD50, RAD51C, RAD51D, RNF43, SDHB, SDHC, SDHD, SMAD4, SMARCA4. STK11, TP53, TSC1, TSC2, and VHL.  The following genes were evaluated for sequence changes only: SDHA and HOXB13 c.251G>A variant only. The report date is January 07, 2020.   Endometrial cancer ([place])  01/08/2019 Imaging   Hypermetabolic activity within central uterus, consistent with known endometrial carcinoma.   No evidence of local or distant metastatic disease.     01/16/2019 Pathology Results   1. Lymph node, sentinel, biopsy, left obturator - NO CARCINOMA IDENTIFIED IN ONE LYMPH NODE (0/1) - SEE COMMENT 2. Lymph node, sentinel, biopsy, right obturator - NO CARCINOMA IDENTIFIED IN ONE LYMPH NODE  (0/1) - SEE COMMENT 3. Uterus +/- tubes/ovaries, neoplastic, cervix, bilateral fallopian tubes and ovaries UTERUS: - ENDOMETRIOID CARCINOMA, FIGO GRADE 3, CONFINED TO THE UTERUS - SEE ONCOLOGY TABLE BELOW CERVIX: - NO CARCINOMA IDENTIFIED BILATERAL OVARIES: - NO CARCINOMA IDENTIFIED BILATERAL FALLOPIAN TUBES: - BENIGN PARATUBAL CYSTS (RIGHT) - NO CARCINOMA IDENTIFIED Microscopic Comment 1. and 2. Cytokeratin AE1/3 was performed on the sentinel lymph nodes to exclude micrometastasis. There is no evidence of metastatic carcinoma by immunohistochemistry. 3. UTERUS, CARCINOMA OR CARCINOSARCOMA Procedure: Hysterectomy and bilateral salpingo-oophorectomy Histologic type: Endometrioid carcinoma Histologic Grade: FIGO Grade 3 Myometrial invasion: Depth of invasion: 4 mm 1 ofMyometrial thickness: 27 mm Uterine Serosa Involvement: Not identified Cervical stromal involvement: Not identified Extent of involvement of other organs: N/A Lymphovascular invasion: Not identified Regional Lymph Nodes: Examined: 2 Sentinel 0 Non-sentinel 2 Total Lymph nodes with metastasis: N/A Isolated tumor cells (< 0.2 mm): 0 Micrometastasis: (> 0.2 mm and < 2.0 mm): 0 Macrometastasis: (> 2.0 mm): 0 Extracapsular extension: N/A Tumor block for ancillary studies: 3C MMR / MSI testing: Pending Pathologic Stage Classification (pTNM, AJCC 8th edition): pT1a, pN0 FIGO Stage: FIGO Ia   01/16/2019 Surgery   Surgeon: Donaciano Eva  Pre-operative Diagnosis: endometrial cancer grade 3    Operation: Robotic-assisted laparoscopic total hysterectomy with bilateral salpingoophorectomy, SLN biopsy  Operative Findings:  : 9cm uterus (mildly broadened and bulky), normal appearing fallopian tubes and ovaries.       12/15/2019 Imaging   1. Status post hysterectomy. Soft tissue nodule in the right hemipelvis measuring 3.4 x 2.6 cm. Soft tissue nodule or left pelvic sidewall/iliac lymph node conglomerate measuring  2.8 x 2.1 cm. Findings are consistent with recurrent/metastatic disease. 2. Moderate volume ascites throughout the abdomen and pelvis with  fine peritoneal nodularity noted in the pelvis and new omental or ventral peritoneal soft tissue thickening, consistent with peritoneal metastatic disease. 3. Moderate right hydronephrosis and hydroureter, the distal right ureter obstructed by right pelvic soft tissue mass noted above. 4. Aortic Atherosclerosis (ICD10-I70.0).   12/22/2019 Cancer Staging   Staging form: Corpus Uteri - Carcinoma and Carcinosarcoma, AJCC 8th Edition - Clinical stage from 12/22/2019: FIGO Stage IVB (rcT1a, cN0, cM1) - Signed by [name], MD on 12/22/2019   12/24/2019 Imaging   CT chest 1. No evidence of metastatic disease in the chest. 2. Stable 3 mm pulmonary nodule of the peripheral right lower lobe, almost certainly incidental and benign. Attention on follow-up.  3. Scarring of the left lung base. 4. Aortic Atherosclerosis (ICD10-I70.0).   12/29/2019 Procedure   Preoperative diagnosis:  1. Right ureteral obstruction 2. Metastatic endometrial cancer   Postoperative diagnosis:  1. Right ureteral obstruction 2. Metastatic endometrial cancer   Procedure:   1. Cystoscopy 2. Right ureteral stent placement (6 x 24-no string)  3. Right retrograde pyelography with interpretation   Surgeon: Pryor Curia. M.D.    Intraoperative findings: Right retrograde pyelography was performed using a 6 French ureteral catheter and Omnipaque contrast.  This demonstrated severe deviation of the distal ureter laterally around the patient's pelvic mass.  The distal ureter was clearly compressed extrinsically without intrinsic filling defects.  The ureter was then significantly dilated above the pelvis with some tortuosity extending into a dilated renal collecting system.     FAMILY HISTORY:  We obtained a detailed, 4-generation family history.  Significant diagnoses are listed  below: Family History  Problem Relation Age of Onset  . Hypertension Mother   . Heart disease Mother   . Cirrhosis Father   . Stroke Maternal Uncle   . Breast cancer Half-Sister        bilateral breast cancer dx in her 53s; pat 1/2 sister  . Lung cancer Half-Sister        smoker; pat 1/2 sibling  . Colon cancer Neg Hx   . Liver disease Neg Hx   . Colon polyps Neg Hx     The patient had five children.  Her first daughter died soon after birth, her first son died due to prematurity, and three other children who are now adults.  None are reported to have cancer.  She has four maternal half sisters and three maternal half brothers, none are reported to have cancer.  Additionally, there are one paternal half brother and five paternal half sisters.  One sister had bilateral breast cancer and another sister had lung cancer.  Both parents are deceased.  The patient's mother died of a heart attack at age 37.  She had two brothers, one where there is no other information on and a second who died of a stroke/heart attack.  The maternal grandparents are deceased from non-cancer related issues.  The patient's father was a [place] Teacher, English as a foreign language and died of complications of diabetes and liver cirrhosis in his 34's-50's.  He had one brother who is cancer free and living in his 37's.  The paternal grandparents are deceased from non-cancer related issues.  Ms. Wanat is unaware of previous family history of genetic testing for hereditary cancer risks. Patient's maternal ancestors are of African American descent, and paternal ancestors are of African American descent. There is no reported Ashkenazi Jewish ancestry. There is no known consanguinity.    GENETIC TEST RESULTS: Genetic testing reported out on January 07, 2020 through the Common hereditary cancer panel found no pathogenic mutations. The Common Hereditary Gene Panel offered by [facility] includes sequencing and/or deletion duplication testing of the  following 48 genes: APC, ATM, AXIN2, BARD1, BMPR1A, BRCA1, BRCA2, BRIP1, CDH1, CDK4, CDKN2A (p14ARF), CDKN2A (p16INK4a), CHEK2, CTNNA1, DICER1, EPCAM (Deletion/duplication testing only), GREM1 (promoter region deletion/duplication testing only), KIT, MEN1, MLH1, MSH2, MSH3, MSH6, MUTYH, NBN, NF1, NHTL1, PALB2, PDGFRA, PMS2, POLD1, POLE, PTEN, RAD50, RAD51C, RAD51D, RNF43, SDHB, SDHC, SDHD, SMAD4, SMARCA4. STK11, TP53, TSC1, TSC2, and VHL.  The following genes were evaluated for sequence changes only: SDHA and HOXB13 c.251G>A variant only. The test report has been scanned into EPIC and is located under the Molecular Pathology section of the Results Review tab.  A portion of the result report is included below for reference.     We discussed with Erica [name] that because current genetic testing is not perfect, it is possible there may be a gene mutation in one of these genes that current testing cannot detect, but that chance is small.  We also discussed, that there could be another gene that has not yet been discovered, or that we have not yet tested, that is responsible for the cancer diagnoses in the family. It is also possible there is a hereditary cause for the cancer in the family that Erica [name] did not inherit and therefore was not identified in her testing.  Therefore, it is important to remain in touch with cancer genetics in the future so that we can continue to offer Erica [name] the most up to date genetic testing.   Genetic testing did identify a variant of uncertain significance (VUS) was identified in the RAD50 gene called c.1556G>T (p.Arg519Leu).  At this time, it is unknown if this variant is associated with increased cancer risk or if this is a normal finding, but most variants such as this get reclassified to being inconsequential. It should not be used to make medical management decisions. With time, we suspect the lab will determine the significance of this variant, if any. If we do learn more  about it, we will try to contact Erica [name] to discuss it further. However, it is important to stay in touch with [place] periodically and keep the address and phone number up to date.  ADDITIONAL GENETIC TESTING: We discussed with Erica [name] that there are other genes that are associated with increased cancer risk that can be analyzed. Should Erica [name] wish to pursue additional genetic testing, we are happy to discuss and coordinate this testing, at any time.    CANCER SCREENING RECOMMENDATIONS: Erica [name] test result is considered negative (normal).  This means that we have not identified a hereditary cause for her personal and family history of cancer at this time. Most cancers happen by chance and this negative test suggests that her cancer may fall into this category.    While reassuring, this does not definitively rule out a hereditary predisposition to cancer. It is still possible that there could be genetic mutations that are undetectable by current technology. There could be genetic mutations in genes that have not been tested or identified to increase cancer risk.  Therefore, it is recommended she continue to follow the cancer management and screening guidelines provided by her oncology and primary healthcare provider.   An individual's cancer risk and medical management are not determined by genetic test results alone. Overall cancer risk assessment incorporates additional factors, including personal medical history, family history, and any available genetic information  that may result in a personalized plan for cancer prevention and surveillance  RECOMMENDATIONS FOR FAMILY MEMBERS:  Individuals in this family might be at some increased risk of developing cancer, over the general population risk, simply due to the family history of cancer.  We recommended women in this family have a yearly mammogram beginning at age 65, or 46 years younger than the earliest onset of cancer, an annual clinical  breast exam, and perform monthly breast self-exams. Women in this family should also have a gynecological exam as recommended by their primary provider. All family members should be referred for colonoscopy starting at age 65.  FOLLOW-UP: Lastly, we discussed with Erica [name] that cancer genetics is a rapidly advancing field and it is possible that new genetic tests will be appropriate for her and/or her family members in the future. We encouraged her to remain in contact with cancer genetics on an annual basis so we can update her personal and family histories and let her know of advances in cancer genetics that may benefit this family.   Our contact number was provided. Erica [name] questions were answered to her satisfaction, and she knows she is welcome to call us at anytime with additional questions or concerns.   Erica [name], [place], [facility] Licensed, Certified Genetic Counselor [name]

## 2020-01-08 NOTE — Telephone Encounter (Signed)
Revealed negative genetic testing.  Discussed that we do not know why she has breast cancer or why there is cancer in the family. It could be due to a different gene that we are not testing, or maybe our current technology may not be able to pick something up.  It will be important for her to keep in contact with genetics to keep up with whether additional testing may be needed.  One RAD50 VUS identified.  This will not change medical management.

## 2020-01-08 NOTE — Telephone Encounter (Signed)
Patient called and asked "Can I have a written prescription for a shower chair. I have a stand up shower with no bars. Can I possible come by today to pick up the script."

## 2020-01-08 NOTE — Telephone Encounter (Signed)
She called and left a message in GYN clinic requesting shower chair Rx. Called and left a message Rx is ready for pick up and to call the office back.

## 2020-01-09 ENCOUNTER — Telehealth: Payer: Self-pay

## 2020-01-09 NOTE — Telephone Encounter (Signed)
Called back regarding shower chair. She will come pick it up today.

## 2020-01-13 ENCOUNTER — Telehealth: Payer: Self-pay

## 2020-01-13 NOTE — Telephone Encounter (Signed)
She called and left a message.  Called back she does not need a Rx for a shower chair. Her daughter bought her one.

## 2020-01-20 ENCOUNTER — Encounter: Payer: Self-pay | Admitting: Hematology and Oncology

## 2020-01-20 NOTE — Progress Notes (Signed)
Called pt to introduce myself as her Arboriculturist.  Unfortunately there aren't any foundations offering copay assistance for her Dx and the type of ins she has.  I offered the [employer], went over what it covers and gave her the income requirement.  She would like to apply so she will bring proof of income on 01/21/20.  If approved I will give her an expense sheet and my card for any questions or concerns she may have in the future.

## 2020-01-21 ENCOUNTER — Other Ambulatory Visit: Payer: Self-pay | Admitting: Hematology and Oncology

## 2020-01-21 ENCOUNTER — Encounter: Payer: Self-pay | Admitting: Hematology and Oncology

## 2020-01-21 ENCOUNTER — Inpatient Hospital Stay: Payer: Medicare Other | Attending: Gynecologic Oncology

## 2020-01-21 ENCOUNTER — Telehealth: Payer: Self-pay | Admitting: Hematology and Oncology

## 2020-01-21 ENCOUNTER — Other Ambulatory Visit: Payer: Self-pay

## 2020-01-21 ENCOUNTER — Inpatient Hospital Stay: Payer: Medicare Other

## 2020-01-21 ENCOUNTER — Inpatient Hospital Stay (HOSPITAL_BASED_OUTPATIENT_CLINIC_OR_DEPARTMENT_OTHER): Payer: Medicare Other | Admitting: Hematology and Oncology

## 2020-01-21 ENCOUNTER — Other Ambulatory Visit: Payer: Medicare Other

## 2020-01-21 VITALS — HR 105

## 2020-01-21 DIAGNOSIS — N135 Crossing vessel and stricture of ureter without hydronephrosis: Secondary | ICD-10-CM | POA: Insufficient documentation

## 2020-01-21 DIAGNOSIS — M7989 Other specified soft tissue disorders: Secondary | ICD-10-CM | POA: Insufficient documentation

## 2020-01-21 DIAGNOSIS — Z853 Personal history of malignant neoplasm of breast: Secondary | ICD-10-CM | POA: Diagnosis not present

## 2020-01-21 DIAGNOSIS — Z5111 Encounter for antineoplastic chemotherapy: Secondary | ICD-10-CM | POA: Diagnosis not present

## 2020-01-21 DIAGNOSIS — G620 Drug-induced polyneuropathy: Secondary | ICD-10-CM | POA: Insufficient documentation

## 2020-01-21 DIAGNOSIS — R109 Unspecified abdominal pain: Secondary | ICD-10-CM | POA: Diagnosis not present

## 2020-01-21 DIAGNOSIS — R202 Paresthesia of skin: Secondary | ICD-10-CM | POA: Diagnosis not present

## 2020-01-21 DIAGNOSIS — G893 Neoplasm related pain (acute) (chronic): Secondary | ICD-10-CM | POA: Diagnosis not present

## 2020-01-21 DIAGNOSIS — Z7189 Other specified counseling: Secondary | ICD-10-CM

## 2020-01-21 DIAGNOSIS — C541 Malignant neoplasm of endometrium: Secondary | ICD-10-CM

## 2020-01-21 DIAGNOSIS — D61818 Other pancytopenia: Secondary | ICD-10-CM | POA: Insufficient documentation

## 2020-01-21 DIAGNOSIS — T451X5A Adverse effect of antineoplastic and immunosuppressive drugs, initial encounter: Secondary | ICD-10-CM | POA: Diagnosis not present

## 2020-01-21 DIAGNOSIS — Z7952 Long term (current) use of systemic steroids: Secondary | ICD-10-CM | POA: Insufficient documentation

## 2020-01-21 DIAGNOSIS — Z79899 Other long term (current) drug therapy: Secondary | ICD-10-CM | POA: Insufficient documentation

## 2020-01-21 DIAGNOSIS — R600 Localized edema: Secondary | ICD-10-CM | POA: Diagnosis not present

## 2020-01-21 LAB — CMP (CANCER CENTER ONLY)
ALT: <6 U/L (ref 0–44)
AST: 14 U/L — ABNORMAL LOW (ref 15–41)
Albumin: 4.1 g/dL (ref 3.5–5.0)
Alkaline Phosphatase: 66 U/L (ref 38–126)
Anion gap: 7 (ref 5–15)
BUN: 8 mg/dL (ref 8–23)
CO2: 29 mmol/L (ref 22–32)
Calcium: 10.4 mg/dL — ABNORMAL HIGH (ref 8.9–10.3)
Chloride: 101 mmol/L (ref 98–111)
Creatinine: 0.72 mg/dL (ref 0.44–1.00)
GFR, Est AFR Am: >60 mL/min (ref >60)
GFR, Estimated: >60 mL/min (ref >60)
Glucose, Bld: 169 mg/dL — ABNORMAL HIGH (ref 70–99)
Potassium: 3.8 mmol/L (ref 3.5–5.1)
Sodium: 137 mmol/L (ref 135–145)
Total Bilirubin: 0.5 mg/dL (ref 0.3–1.2)
Total Protein: 8.5 g/dL — ABNORMAL HIGH (ref 6.5–8.1)

## 2020-01-21 LAB — CBC WITH DIFFERENTIAL (CANCER CENTER ONLY)
Abs Immature Granulocytes: 0.03 10*3/uL (ref 0.00–0.07)
Basophils Absolute: 0 10*3/uL (ref 0.0–0.1)
Basophils Relative: 0 %
Eosinophils Absolute: 0 10*3/uL (ref 0.0–0.5)
Eosinophils Relative: 0 %
HCT: 31.1 % — ABNORMAL LOW (ref 36.0–46.0)
Hemoglobin: 10.1 g/dL — ABNORMAL LOW (ref 12.0–15.0)
Immature Granulocytes: 0 %
Lymphocytes Relative: 5 %
Lymphs Abs: 0.5 10*3/uL — ABNORMAL LOW (ref 0.7–4.0)
MCH: 33.3 pg (ref 26.0–34.0)
MCHC: 32.5 g/dL (ref 30.0–36.0)
MCV: 102.6 fL — ABNORMAL HIGH (ref 80.0–100.0)
Monocytes Absolute: 0.2 10*3/uL (ref 0.1–1.0)
Monocytes Relative: 2 %
Neutro Abs: 8 10*3/uL — ABNORMAL HIGH (ref 1.7–7.7)
Neutrophils Relative %: 93 %
Platelet Count: 148 10*3/uL — ABNORMAL LOW (ref 150–400)
RBC: 3.03 MIL/uL — ABNORMAL LOW (ref 3.87–5.11)
RDW: 12.6 % (ref 11.5–15.5)
WBC Count: 8.7 10*3/uL (ref 4.0–10.5)
nRBC: 0 % (ref 0.0–0.2)

## 2020-01-21 MED ORDER — DIPHENHYDRAMINE HCL 50 MG/ML IJ SOLN
12.5000 mg | Freq: Once | INTRAMUSCULAR | Status: AC
  Administered 2020-01-21: 12.5 mg via INTRAVENOUS

## 2020-01-21 MED ORDER — DIPHENHYDRAMINE HCL 50 MG/ML IJ SOLN
INTRAMUSCULAR | Status: AC
  Filled 2020-01-21: qty 1

## 2020-01-21 MED ORDER — SODIUM CHLORIDE 0.9 % IV SOLN
Freq: Once | INTRAVENOUS | Status: AC
  Filled 2020-01-21: qty 250

## 2020-01-21 MED ORDER — FAMOTIDINE IN NACL 20-0.9 MG/50ML-% IV SOLN
20.0000 mg | Freq: Once | INTRAVENOUS | Status: AC
  Administered 2020-01-21: 20 mg via INTRAVENOUS

## 2020-01-21 MED ORDER — FAMOTIDINE IN NACL 20-0.9 MG/50ML-% IV SOLN
INTRAVENOUS | Status: AC
  Filled 2020-01-21: qty 50

## 2020-01-21 MED ORDER — SODIUM CHLORIDE 0.9 % IV SOLN
150.0000 mg | Freq: Once | INTRAVENOUS | Status: AC
  Administered 2020-01-21: 150 mg via INTRAVENOUS
  Filled 2020-01-21: qty 150

## 2020-01-21 MED ORDER — SODIUM CHLORIDE 0.9 % IV SOLN
10.0000 mg | Freq: Once | INTRAVENOUS | Status: AC
  Administered 2020-01-21: 10 mg via INTRAVENOUS
  Filled 2020-01-21: qty 1

## 2020-01-21 MED ORDER — SODIUM CHLORIDE 0.9 % IV SOLN
416.4000 mg | Freq: Once | INTRAVENOUS | Status: AC
  Administered 2020-01-21: 420 mg via INTRAVENOUS
  Filled 2020-01-21: qty 42

## 2020-01-21 MED ORDER — PALONOSETRON HCL INJECTION 0.25 MG/5ML
INTRAVENOUS | Status: AC
  Filled 2020-01-21: qty 5

## 2020-01-21 MED ORDER — SODIUM CHLORIDE 0.9 % IV SOLN
175.0000 mg/m2 | Freq: Once | INTRAVENOUS | Status: AC
  Administered 2020-01-21: 270 mg via INTRAVENOUS
  Filled 2020-01-21: qty 45

## 2020-01-21 MED ORDER — PALONOSETRON HCL INJECTION 0.25 MG/5ML
0.2500 mg | Freq: Once | INTRAVENOUS | Status: AC
  Administered 2020-01-21: 0.25 mg via INTRAVENOUS

## 2020-01-21 NOTE — Assessment & Plan Note (Signed)
This is improved She will continue to take pain medicine as needed

## 2020-01-21 NOTE — Assessment & Plan Note (Signed)
Overall, she have positive response to treatment Her ascites has resolved Her leg swelling has improved She is urinating well without difficulties She have less abdominal pain I will adjust the dose of treatment due to recent weight loss I recommend minimum 3 cycles of treatment before repeating imaging study

## 2020-01-21 NOTE — Progress Notes (Signed)
[facility] OFFICE PROGRESS NOTE  Patient Care Team: [name], MD as PCP - General (Family Medicine)  ASSESSMENT & PLAN:  Endometrial cancer ([place]) Overall, she have positive response to treatment Her ascites has resolved Her leg swelling has improved She is urinating well without difficulties She have less abdominal pain I will adjust the dose of treatment due to recent weight loss I recommend minimum 3 cycles of treatment before repeating imaging study  Cancer associated pain This is improved She will continue to take pain medicine as needed  Pancytopenia, acquired [facility]) She is not symptomatic Observe for now  Peripheral neuropathy due to chemotherapy [facility]) she has mild peripheral neuropathy, likely related to side effects of treatment. It is only mild, not bothering the patient. I will observe for now If it gets worse in the future, I will consider modifying the dose of the treatment    No orders of the defined types were placed in this encounter.   All questions were answered. The patient knows to call the clinic with any problems, questions or concerns. The total time spent in the appointment was 20 minutes encounter with patients including review of chart and various tests results, discussions about plan of care and coordination of care plan   [name], MD 01/21/2020 11:46 AM  INTERVAL HISTORY: Please see below for problem oriented charting. She is seen prior to cycle 2 of treatment She felt better She has lost a lot of weight but this is because of resolution of ascites and improved leg swelling She has minimum pain Denies recent constipation She had very mild tingling sensation but not major neuropathy from treatment  SUMMARY OF ONCOLOGIC HISTORY: Oncology History Overview Note  ER/PR negative, MSI stable Genetic testing reported out on January 07, 2020 through the Common hereditary cancer panel found no pathogenic mutations   Breast  cancer, right breast ([place])  03/13/2014 Initial Diagnosis   Breast cancer, right breast ([place])   01/07/2020 Genetic Testing   Negative genetic testing.  RAD50 c.1556C>T VUS identified.  The Common Hereditary Gene Panel offered by [facility] includes sequencing and/or deletion duplication testing of the following 48 genes: APC, ATM, AXIN2, BARD1, BMPR1A, BRCA1, BRCA2, BRIP1, CDH1, CDK4, CDKN2A (p14ARF), CDKN2A (p16INK4a), CHEK2, CTNNA1, DICER1, EPCAM (Deletion/duplication testing only), GREM1 (promoter region deletion/duplication testing only), KIT, MEN1, MLH1, MSH2, MSH3, MSH6, MUTYH, NBN, NF1, NHTL1, PALB2, PDGFRA, PMS2, POLD1, POLE, PTEN, RAD50, RAD51C, RAD51D, RNF43, SDHB, SDHC, SDHD, SMAD4, SMARCA4. STK11, TP53, TSC1, TSC2, and VHL.  The following genes were evaluated for sequence changes only: SDHA and HOXB13 c.251G>A variant only. The report date is January 07, 2020.   Endometrial cancer ([place])  01/08/2019 Imaging   Hypermetabolic activity within central uterus, consistent with known endometrial carcinoma.   No evidence of local or distant metastatic disease.     01/16/2019 Pathology Results   1. Lymph node, sentinel, biopsy, left obturator - NO CARCINOMA IDENTIFIED IN ONE LYMPH NODE (0/1) - SEE COMMENT 2. Lymph node, sentinel, biopsy, right obturator - NO CARCINOMA IDENTIFIED IN ONE LYMPH NODE (0/1) - SEE COMMENT 3. Uterus +/- tubes/ovaries, neoplastic, cervix, bilateral fallopian tubes and ovaries UTERUS: - ENDOMETRIOID CARCINOMA, FIGO GRADE 3, CONFINED TO THE UTERUS - SEE ONCOLOGY TABLE BELOW CERVIX: - NO CARCINOMA IDENTIFIED BILATERAL OVARIES: - NO CARCINOMA IDENTIFIED BILATERAL FALLOPIAN TUBES: - BENIGN PARATUBAL CYSTS (RIGHT) - NO CARCINOMA IDENTIFIED Microscopic Comment 1. and 2. Cytokeratin AE1/3 was performed on the sentinel lymph nodes to exclude micrometastasis. There is no evidence  of metastatic carcinoma by immunohistochemistry. 3. UTERUS, CARCINOMA OR  CARCINOSARCOMA Procedure: Hysterectomy and bilateral salpingo-oophorectomy Histologic type: Endometrioid carcinoma Histologic Grade: FIGO Grade 3 Myometrial invasion: Depth of invasion: 4 mm 1 ofMyometrial thickness: 27 mm Uterine Serosa Involvement: Not identified Cervical stromal involvement: Not identified Extent of involvement of other organs: N/A Lymphovascular invasion: Not identified Regional Lymph Nodes: Examined: 2 Sentinel 0 Non-sentinel 2 Total Lymph nodes with metastasis: N/A Isolated tumor cells (< 0.2 mm): 0 Micrometastasis: (> 0.2 mm and < 2.0 mm): 0 Macrometastasis: (> 2.0 mm): 0 Extracapsular extension: N/A Tumor block for ancillary studies: 3C MMR / MSI testing: Pending Pathologic Stage Classification (pTNM, AJCC 8th edition): pT1a, pN0 FIGO Stage: FIGO Ia   01/16/2019 Surgery   Surgeon: Donaciano Eva  Pre-operative Diagnosis: endometrial cancer grade 3    Operation: Robotic-assisted laparoscopic total hysterectomy with bilateral salpingoophorectomy, SLN biopsy  Operative Findings:  : 9cm uterus (mildly broadened and bulky), normal appearing fallopian tubes and ovaries.       12/15/2019 Imaging   1. Status post hysterectomy. Soft tissue nodule in the right hemipelvis measuring 3.4 x 2.6 cm. Soft tissue nodule or left pelvic sidewall/iliac lymph node conglomerate measuring 2.8 x 2.1 cm. Findings are consistent with recurrent/metastatic disease. 2. Moderate volume ascites throughout the abdomen and pelvis with fine peritoneal nodularity noted in the pelvis and new omental or ventral peritoneal soft tissue thickening, consistent with peritoneal metastatic disease. 3. Moderate right hydronephrosis and hydroureter, the distal right ureter obstructed by right pelvic soft tissue mass noted above. 4. Aortic Atherosclerosis (ICD10-I70.0).   12/22/2019 Cancer Staging   Staging form: Corpus Uteri - Carcinoma and Carcinosarcoma, AJCC 8th Edition - Clinical stage from  12/22/2019: FIGO Stage IVB (rcT1a, cN0, cM1) - Signed by [name], MD on 12/22/2019   12/24/2019 Imaging   CT chest 1. No evidence of metastatic disease in the chest. 2. Stable 3 mm pulmonary nodule of the peripheral right lower lobe, almost certainly incidental and benign. Attention on follow-up.  3. Scarring of the left lung base. 4. Aortic Atherosclerosis (ICD10-I70.0).   12/29/2019 Procedure   Preoperative diagnosis:  1. Right ureteral obstruction 2. Metastatic endometrial cancer   Postoperative diagnosis:  1. Right ureteral obstruction 2. Metastatic endometrial cancer   Procedure:   1. Cystoscopy 2. Right ureteral stent placement (6 x 24-no string)  3. Right retrograde pyelography with interpretation   Surgeon: Pryor Curia. M.D.    Intraoperative findings: Right retrograde pyelography was performed using a 6 French ureteral catheter and Omnipaque contrast.  This demonstrated severe deviation of the distal ureter laterally around the patient's pelvic mass.  The distal ureter was clearly compressed extrinsically without intrinsic filling defects.  The ureter was then significantly dilated above the pelvis with some tortuosity extending into a dilated renal collecting system.   12/31/2019 -  Chemotherapy   The patient had carboplatin and taxol for chemotherapy treatment.     01/08/2020 Genetic Testing   Genetic testing reported out on January 07, 2020 through the Common hereditary cancer panel found no pathogenic mutations     REVIEW OF SYSTEMS:   Constitutional: Denies fevers, chills  Eyes: Denies blurriness of vision Ears, nose, mouth, throat, and face: Denies mucositis or sore throat Respiratory: Denies cough, dyspnea or wheezes Cardiovascular: Denies palpitation, chest discomfort Gastrointestinal:  Denies nausea, heartburn or change in bowel habits Skin: Denies abnormal skin rashes Lymphatics: Denies new lymphadenopathy or easy bruising Behavioral/Psych: Mood is  stable, no new changes  All  other systems were reviewed with the patient and are negative.  I have reviewed the past medical history, past surgical history, social history and family history with the patient and they are unchanged from previous note.  ALLERGIES:  is allergic to gadolinium and other.  MEDICATIONS:  Current Outpatient Medications  Medication Sig Dispense Refill  . acetaminophen (TYLENOL) 500 MG tablet Take 500 mg by mouth every 6 (six) hours as needed for moderate pain.     [name] alendronate (FOSAMAX) 70 MG tablet Take 70 mg by mouth once a week.     . Calcium Carb-Cholecalciferol (CALCIUM 600+D3) 600-200 MG-UNIT TABS Take 600 mg by mouth daily.     . Capsaicin (ARTHRITIS PAIN RELIEF EX) Apply 1 application topically daily as needed (Arthritis pain).    [name] dexamethasone (DECADRON) 4 MG tablet Take 2 tabs at the night before and 2 tab the morning of chemotherapy, every 3 weeks, by mouth x 6 cycles 36 tablet 6  . Docusate Sodium (STOOL SOFTENER) 100 MG capsule Take 100 mg by mouth daily as needed for constipation.    . fluticasone (FLONASE) 50 MCG/ACT nasal spray Place 1 spray into both nostrils daily as needed for allergies or rhinitis.     . hydrocortisone cream 0.5 % Apply 1 application topically daily as needed for itching.    . levothyroxine (SYNTHROID) 88 MCG tablet Take 88 mcg by mouth every morning.    . loratadine (CLARITIN) 10 MG tablet Take 10 mg by mouth daily.    . Multiple Vitamin (MULTIVITAMIN) tablet Take 1 tablet by mouth daily.    . naphazoline-pheniramine (ALLERGY EYE) 0.025-0.3 % ophthalmic solution Place 1 drop into both eyes daily as needed for allergies.    [name] ondansetron (ZOFRAN) 8 MG tablet Take 1 tablet (8 mg total) by mouth every 8 (eight) hours as needed. Start on the third day after chemotherapy. 30 tablet 1  . oxyCODONE (OXY IR/ROXICODONE) 5 MG immediate release tablet Take 1 tablet (5 mg total) by mouth every 4 (four) hours as needed for severe pain. 50  tablet 0  . prochlorperazine (COMPAZINE) 10 MG tablet Take 1 tablet (10 mg total) by mouth every 6 (six) hours as needed (Nausea or vomiting). 30 tablet 1  . senna-docusate (SENOKOT-S) 8.6-50 MG tablet Take 2 tablets by mouth at bedtime. For AFTER surgery, do not take if having diarrhea (Patient taking differently: Take 2 tablets by mouth at bedtime as needed for mild constipation or moderate constipation. ) 30 tablet 0   No current facility-administered medications for this visit.   Facility-Administered Medications Ordered in Other Visits  Medication Dose Route Frequency Provider Last Rate Last Admin  . CARBOplatin (PARAPLATIN) 420 mg in sodium chloride 0.9 % 250 mL chemo infusion  420 mg Intravenous Once Alvy Bimler, Lourdes Manning, MD      . dexamethasone (DECADRON) 10 mg in sodium chloride 0.9 % 50 mL IVPB  10 mg Intravenous Once Alvy Bimler, Nykeria Mealing, MD      . famotidine (PEPCID) IVPB 20 mg premix  20 mg Intravenous Once Alvy Bimler, Aalia Greulich, MD 200 mL/hr at 01/21/20 1143 20 mg at 01/21/20 1143  . fosaprepitant (EMEND) 150 mg in sodium chloride 0.9 % 145 mL IVPB  150 mg Intravenous Once Alvy Bimler, Nannie Starzyk, MD      . PACLitaxel (TAXOL) 270 mg in sodium chloride 0.9 % 250 mL chemo infusion (> 9m/m2)  175 mg/m2 (Treatment Plan Recorded) Intravenous Once GHeath Lark MD        PHYSICAL EXAMINATION: ECOG PERFORMANCE  STATUS: 1 - Symptomatic but completely ambulatory  Vitals:   01/21/20 1020  BP: (!) 166/76  Pulse: (!) 122  Resp: 20  Temp: (!) 97.3 F (36.3 C)  SpO2: 100%   Filed Weights   01/21/20 1020  Weight: 120 lb 3.2 oz (54.5 kg)    GENERAL:alert, no distress and comfortable SKIN: skin color, texture, turgor are normal, no rashes or significant lesions EYES: normal, Conjunctiva are pink and non-injected, sclera clear OROPHARYNX:no exudate, no erythema and lips, buccal mucosa, and tongue normal  NECK: supple, thyroid normal size, non-tender, without nodularity LYMPH:  no palpable lymphadenopathy in the cervical,  axillary or inguinal LUNGS: clear to auscultation and percussion with normal breathing effort HEART: regular rate & rhythm and no murmurs with mild bilateral lower extremity edema, improved compared to previous visit ABDOMEN:abdomen soft, non-tender and normal bowel sounds Musculoskeletal:no cyanosis of digits and no clubbing  NEURO: alert & oriented x 3 with fluent speech, no focal motor/sensory deficits  LABORATORY DATA:  I have reviewed the data as listed    Component Value Date/Time   NA 137 01/21/2020 1005   NA 142 12/08/2014 1153   K 3.8 01/21/2020 1005   K 3.7 12/08/2014 1153   CL 101 01/21/2020 1005   CO2 29 01/21/2020 1005   CO2 29 12/08/2014 1153   GLUCOSE 169 (H) 01/21/2020 1005   GLUCOSE 84 12/08/2014 1153   BUN 8 01/21/2020 1005   BUN 8.6 12/08/2014 1153   CREATININE 0.72 01/21/2020 1005   CREATININE 0.7 12/08/2014 1153   CALCIUM 10.4 (H) 01/21/2020 1005   CALCIUM 9.3 12/08/2014 1153   PROT 8.5 (H) 01/21/2020 1005   PROT 7.2 12/08/2014 1153   ALBUMIN 4.1 01/21/2020 1005   ALBUMIN 3.8 12/08/2014 1153   AST 14 (L) 01/21/2020 1005   AST 21 12/08/2014 1153   ALT <6 01/21/2020 1005   ALT 9 12/08/2014 1153   ALKPHOS 66 01/21/2020 1005   ALKPHOS 63 12/08/2014 1153   BILITOT 0.5 01/21/2020 1005   BILITOT 0.56 12/08/2014 1153   GFRNONAA >60 01/21/2020 1005   GFRAA >60 01/21/2020 1005    No results found for: SPEP, UPEP  Lab Results  Component Value Date   WBC 8.7 01/21/2020   NEUTROABS 8.0 (H) 01/21/2020   HGB 10.1 (L) 01/21/2020   HCT 31.1 (L) 01/21/2020   MCV 102.6 (H) 01/21/2020   PLT 148 (L) 01/21/2020      Chemistry      Component Value Date/Time   NA 137 01/21/2020 1005   NA 142 12/08/2014 1153   K 3.8 01/21/2020 1005   K 3.7 12/08/2014 1153   CL 101 01/21/2020 1005   CO2 29 01/21/2020 1005   CO2 29 12/08/2014 1153   BUN 8 01/21/2020 1005   BUN 8.6 12/08/2014 1153   CREATININE 0.72 01/21/2020 1005   CREATININE 0.7 12/08/2014 1153       Component Value Date/Time   CALCIUM 10.4 (H) 01/21/2020 1005   CALCIUM 9.3 12/08/2014 1153   ALKPHOS 66 01/21/2020 1005   ALKPHOS 63 12/08/2014 1153   AST 14 (L) 01/21/2020 1005   AST 21 12/08/2014 1153   ALT <6 01/21/2020 1005   ALT 9 12/08/2014 1153   BILITOT 0.5 01/21/2020 1005   BILITOT 0.56 12/08/2014 1153

## 2020-01-21 NOTE — Assessment & Plan Note (Signed)
she has mild peripheral neuropathy, likely related to side effects of treatment. It is only mild, not bothering the patient. I will observe for now If it gets worse in the future, I will consider modifying the dose of the treatment  

## 2020-01-21 NOTE — Assessment & Plan Note (Signed)
She is not symptomatic. Observe for now 

## 2020-01-21 NOTE — Progress Notes (Signed)
Ok to treat with elevated HR per dr Alvy Bimler

## 2020-01-21 NOTE — Patient Instructions (Signed)
[facility] Discharge Instructions for Patients Receiving Chemotherapy  Today you received the following chemotherapy agents Taxol, Carbo  To help prevent nausea and vomiting after your treatment, we encourage you to take your nausea medication.DO NOT TAKE ZOFRAN FOR THREE DAYS AFTER TREATMENT.   If you develop nausea and vomiting that is not controlled by your nausea medication, call the clinic.   BELOW ARE SYMPTOMS THAT SHOULD BE REPORTED IMMEDIATELY:  *FEVER GREATER THAN 100.5 F  *CHILLS WITH OR WITHOUT FEVER  NAUSEA AND VOMITING THAT IS NOT CONTROLLED WITH YOUR NAUSEA MEDICATION  *UNUSUAL SHORTNESS OF BREATH  *UNUSUAL BRUISING OR BLEEDING  TENDERNESS IN MOUTH AND THROAT WITH OR WITHOUT PRESENCE OF ULCERS  *URINARY PROBLEMS  *BOWEL PROBLEMS  UNUSUAL RASH Items with * indicate a potential emergency and should be followed up as soon as possible.  Feel free to call the clinic should you have any questions or concerns. The clinic phone number is [phone].  Please show the [facility] at check-in to the Emergency Department and triage nurse.

## 2020-01-21 NOTE — Progress Notes (Signed)
Pt is approved for the $1000 [employer] grant.  

## 2020-01-21 NOTE — Telephone Encounter (Signed)
Scheduled appts per 9/1 los. Gave pt a print out of AVS.  

## 2020-02-10 ENCOUNTER — Inpatient Hospital Stay: Payer: Medicare Other

## 2020-02-10 ENCOUNTER — Other Ambulatory Visit: Payer: Self-pay | Admitting: Hematology and Oncology

## 2020-02-10 ENCOUNTER — Inpatient Hospital Stay (HOSPITAL_BASED_OUTPATIENT_CLINIC_OR_DEPARTMENT_OTHER): Payer: Medicare Other | Admitting: Hematology and Oncology

## 2020-02-10 ENCOUNTER — Other Ambulatory Visit: Payer: Self-pay

## 2020-02-10 ENCOUNTER — Encounter: Payer: Self-pay | Admitting: Hematology and Oncology

## 2020-02-10 VITALS — BP 159/51 | HR 83 | Temp 97.8°F | Resp 18 | Ht 62.0 in | Wt 128.0 lb

## 2020-02-10 DIAGNOSIS — M7989 Other specified soft tissue disorders: Secondary | ICD-10-CM

## 2020-02-10 DIAGNOSIS — G620 Drug-induced polyneuropathy: Secondary | ICD-10-CM | POA: Diagnosis not present

## 2020-02-10 DIAGNOSIS — C541 Malignant neoplasm of endometrium: Secondary | ICD-10-CM | POA: Diagnosis not present

## 2020-02-10 DIAGNOSIS — Z7189 Other specified counseling: Secondary | ICD-10-CM

## 2020-02-10 DIAGNOSIS — D61818 Other pancytopenia: Secondary | ICD-10-CM

## 2020-02-10 DIAGNOSIS — T451X5A Adverse effect of antineoplastic and immunosuppressive drugs, initial encounter: Secondary | ICD-10-CM

## 2020-02-10 DIAGNOSIS — Z5111 Encounter for antineoplastic chemotherapy: Secondary | ICD-10-CM | POA: Diagnosis not present

## 2020-02-10 LAB — CBC WITH DIFFERENTIAL (CANCER CENTER ONLY)
Abs Immature Granulocytes: 0.01 10*3/uL (ref 0.00–0.07)
Basophils Absolute: 0 10*3/uL (ref 0.0–0.1)
Basophils Relative: 1 %
Eosinophils Absolute: 0 10*3/uL (ref 0.0–0.5)
Eosinophils Relative: 1 %
HCT: 27.4 % — ABNORMAL LOW (ref 36.0–46.0)
Hemoglobin: 8.8 g/dL — ABNORMAL LOW (ref 12.0–15.0)
Immature Granulocytes: 0 %
Lymphocytes Relative: 26 %
Lymphs Abs: 0.6 10*3/uL — ABNORMAL LOW (ref 0.7–4.0)
MCH: 34.1 pg — ABNORMAL HIGH (ref 26.0–34.0)
MCHC: 32.1 g/dL (ref 30.0–36.0)
MCV: 106.2 fL — ABNORMAL HIGH (ref 80.0–100.0)
Monocytes Absolute: 0.3 10*3/uL (ref 0.1–1.0)
Monocytes Relative: 13 %
Neutro Abs: 1.4 10*3/uL — ABNORMAL LOW (ref 1.7–7.7)
Neutrophils Relative %: 59 %
Platelet Count: 206 10*3/uL (ref 150–400)
RBC: 2.58 MIL/uL — ABNORMAL LOW (ref 3.87–5.11)
RDW: 13.2 % (ref 11.5–15.5)
WBC Count: 2.4 10*3/uL — ABNORMAL LOW (ref 4.0–10.5)
nRBC: 0 % (ref 0.0–0.2)

## 2020-02-10 LAB — CMP (CANCER CENTER ONLY)
ALT: 9 U/L (ref 0–44)
AST: 16 U/L (ref 15–41)
Albumin: 3.6 g/dL (ref 3.5–5.0)
Alkaline Phosphatase: 65 U/L (ref 38–126)
Anion gap: 7 (ref 5–15)
BUN: 7 mg/dL — ABNORMAL LOW (ref 8–23)
CO2: 28 mmol/L (ref 22–32)
Calcium: 9.7 mg/dL (ref 8.9–10.3)
Chloride: 103 mmol/L (ref 98–111)
Creatinine: 0.68 mg/dL (ref 0.44–1.00)
GFR, Est AFR Am: >60 mL/min (ref >60)
GFR, Estimated: >60 mL/min (ref >60)
Glucose, Bld: 90 mg/dL (ref 70–99)
Potassium: 3.9 mmol/L (ref 3.5–5.1)
Sodium: 138 mmol/L (ref 135–145)
Total Bilirubin: 0.4 mg/dL (ref 0.3–1.2)
Total Protein: 8.1 g/dL (ref 6.5–8.1)

## 2020-02-10 MED ORDER — FAMOTIDINE IN NACL 20-0.9 MG/50ML-% IV SOLN
20.0000 mg | Freq: Once | INTRAVENOUS | Status: AC
  Administered 2020-02-10: 20 mg via INTRAVENOUS

## 2020-02-10 MED ORDER — SODIUM CHLORIDE 0.9 % IV SOLN
150.0000 mg | Freq: Once | INTRAVENOUS | Status: AC
  Administered 2020-02-10: 150 mg via INTRAVENOUS
  Filled 2020-02-10: qty 150

## 2020-02-10 MED ORDER — SODIUM CHLORIDE 0.9 % IV SOLN
Freq: Once | INTRAVENOUS | Status: AC
  Filled 2020-02-10: qty 250

## 2020-02-10 MED ORDER — FAMOTIDINE IN NACL 20-0.9 MG/50ML-% IV SOLN
INTRAVENOUS | Status: AC
  Filled 2020-02-10: qty 50

## 2020-02-10 MED ORDER — SODIUM CHLORIDE 0.9 % IV SOLN
347.0000 mg | Freq: Once | INTRAVENOUS | Status: AC
  Administered 2020-02-10: 350 mg via INTRAVENOUS
  Filled 2020-02-10: qty 35

## 2020-02-10 MED ORDER — PALONOSETRON HCL INJECTION 0.25 MG/5ML
0.2500 mg | Freq: Once | INTRAVENOUS | Status: AC
  Administered 2020-02-10: 0.25 mg via INTRAVENOUS

## 2020-02-10 MED ORDER — DIPHENHYDRAMINE HCL 50 MG/ML IJ SOLN
INTRAMUSCULAR | Status: AC
  Filled 2020-02-10: qty 1

## 2020-02-10 MED ORDER — DIPHENHYDRAMINE HCL 50 MG/ML IJ SOLN
12.5000 mg | Freq: Once | INTRAMUSCULAR | Status: AC
  Administered 2020-02-10: 12.5 mg via INTRAVENOUS

## 2020-02-10 MED ORDER — SODIUM CHLORIDE 0.9 % IV SOLN
10.0000 mg | Freq: Once | INTRAVENOUS | Status: AC
  Administered 2020-02-10: 10 mg via INTRAVENOUS
  Filled 2020-02-10: qty 10

## 2020-02-10 MED ORDER — SODIUM CHLORIDE 0.9 % IV SOLN
131.2500 mg/m2 | Freq: Once | INTRAVENOUS | Status: AC
  Administered 2020-02-10: 204 mg via INTRAVENOUS
  Filled 2020-02-10: qty 34

## 2020-02-10 MED ORDER — PALONOSETRON HCL INJECTION 0.25 MG/5ML
INTRAVENOUS | Status: AC
  Filled 2020-02-10: qty 5

## 2020-02-10 NOTE — Assessment & Plan Note (Signed)
She is having some peripheral neuropathy I plan to reduce the dose of paclitaxel

## 2020-02-10 NOTE — Assessment & Plan Note (Signed)
She continues to have intermittent leg swelling We discussed the use of compression elastic hose and physical therapy for lymphedema The patient declined I recommend dietary modification and reduce salt intake to reduce fluid retention I explained to the patient and her daughter the reason why her leg is swollen, likely secondary to intra-abdominal disease

## 2020-02-10 NOTE — Progress Notes (Signed)
[place] OFFICE PROGRESS NOTE  Patient Care Team: [name], MD as PCP - General (Family Medicine)  ASSESSMENT & PLAN:  Endometrial cancer ([place]) Overall, she have positive response to treatment Her ascites has resolved Her intermittent leg swelling is not unexpected She is urinating well without difficulties She have less abdominal pain I will adjust the dose of treatment due to mild pancytopenia We discussed the risk and benefits of continuing treatment as the patient is undecided Ultimately, she agreed to proceed I plan to order CT imaging before her next visit  Pancytopenia, acquired [facility]) She has significant pancytopenia I plan to reduce the dose of both the carboplatin and the pocket Taxol She does not need transfusion support  Peripheral neuropathy due to chemotherapy [facility]) She is having some peripheral neuropathy I plan to reduce the dose of paclitaxel  Left leg swelling She continues to have intermittent leg swelling We discussed the use of compression elastic hose and physical therapy for lymphedema The patient declined I recommend dietary modification and reduce salt intake to reduce fluid retention I explained to the patient and her daughter the reason why her leg is swollen, likely secondary to intra-abdominal disease   Orders Placed This Encounter  Procedures  . CT ABDOMEN PELVIS W CONTRAST    Standing Status:   Future    Standing Expiration Date:   02/09/2021    Order Specific Question:   If indicated for the ordered procedure, I authorize the administration of contrast media per Radiology protocol    Answer:   Yes    Order Specific Question:   Preferred imaging location?    Answer:   [facility]    Order Specific Question:   Radiology Contrast Protocol - do NOT remove file path    Answer:   [email or web address]    All questions were answered. The patient knows to call the clinic with any  problems, questions or concerns. The total time spent in the appointment was 40 minutes encounter with patients including review of chart and various tests results, discussions about plan of care and coordination of care plan   [name], MD 02/10/2020 10:30 AM  INTERVAL HISTORY: Please see below for problem oriented charting. She returns with family for further follow-up She complained of increased girth of the left lower extremity edema again She also have mild peripheral neuropathy at the tips of fingers and toes No recent nausea or constipation The patient was angry when her leg got swollen again She did not take premedication dexamethasone last night of this morning and told me she does not want treatment unless the leg swelling resolved I try my best to explain to the patient the rationale of pursuing further chemotherapy in the time it takes for treatment to work We need to order CT imaging after today's dose and finally, the patient agreed to proceed with plan of care  SUMMARY OF ONCOLOGIC HISTORY: Oncology History Overview Note  ER/PR negative, MSI stable Genetic testing reported out on January 07, 2020 through the Common hereditary cancer panel found no pathogenic mutations   Breast cancer, right breast ([place])  03/13/2014 Initial Diagnosis   Breast cancer, right breast ([place])   01/07/2020 Genetic Testing   Negative genetic testing.  RAD50 c.1556C>T VUS identified.  The Common Hereditary Gene Panel offered by [facility] includes sequencing and/or deletion duplication testing of the following 48 genes: APC, ATM, AXIN2, BARD1, BMPR1A, BRCA1, BRCA2, BRIP1, CDH1, CDK4, CDKN2A (p14ARF), CDKN2A (p16INK4a), CHEK2, CTNNA1, DICER1, EPCAM (  Deletion/duplication testing only), GREM1 (promoter region deletion/duplication testing only), KIT, MEN1, MLH1, MSH2, MSH3, MSH6, MUTYH, NBN, NF1, NHTL1, PALB2, PDGFRA, PMS2, POLD1, POLE, PTEN, RAD50, RAD51C, RAD51D, RNF43, SDHB, SDHC, SDHD, SMAD4, SMARCA4.  STK11, TP53, TSC1, TSC2, and VHL.  The following genes were evaluated for sequence changes only: SDHA and HOXB13 c.251G>A variant only. The report date is January 07, 2020.   Endometrial cancer ([place])  01/08/2019 Imaging   Hypermetabolic activity within central uterus, consistent with known endometrial carcinoma.   No evidence of local or distant metastatic disease.     01/16/2019 Pathology Results   1. Lymph node, sentinel, biopsy, left obturator - NO CARCINOMA IDENTIFIED IN ONE LYMPH NODE (0/1) - SEE COMMENT 2. Lymph node, sentinel, biopsy, right obturator - NO CARCINOMA IDENTIFIED IN ONE LYMPH NODE (0/1) - SEE COMMENT 3. Uterus +/- tubes/ovaries, neoplastic, cervix, bilateral fallopian tubes and ovaries UTERUS: - ENDOMETRIOID CARCINOMA, FIGO GRADE 3, CONFINED TO THE UTERUS - SEE ONCOLOGY TABLE BELOW CERVIX: - NO CARCINOMA IDENTIFIED BILATERAL OVARIES: - NO CARCINOMA IDENTIFIED BILATERAL FALLOPIAN TUBES: - BENIGN PARATUBAL CYSTS (RIGHT) - NO CARCINOMA IDENTIFIED Microscopic Comment 1. and 2. Cytokeratin AE1/3 was performed on the sentinel lymph nodes to exclude micrometastasis. There is no evidence of metastatic carcinoma by immunohistochemistry. 3. UTERUS, CARCINOMA OR CARCINOSARCOMA Procedure: Hysterectomy and bilateral salpingo-oophorectomy Histologic type: Endometrioid carcinoma Histologic Grade: FIGO Grade 3 Myometrial invasion: Depth of invasion: 4 mm 1 ofMyometrial thickness: 27 mm Uterine Serosa Involvement: Not identified Cervical stromal involvement: Not identified Extent of involvement of other organs: N/A Lymphovascular invasion: Not identified Regional Lymph Nodes: Examined: 2 Sentinel 0 Non-sentinel 2 Total Lymph nodes with metastasis: N/A Isolated tumor cells (< 0.2 mm): 0 Micrometastasis: (> 0.2 mm and < 2.0 mm): 0 Macrometastasis: (> 2.0 mm): 0 Extracapsular extension: N/A Tumor block for ancillary studies: 3C MMR / MSI testing: Pending Pathologic  Stage Classification (pTNM, AJCC 8th edition): pT1a, pN0 FIGO Stage: FIGO Ia   01/16/2019 Surgery   Surgeon: Donaciano Eva  Pre-operative Diagnosis: endometrial cancer grade 3    Operation: Robotic-assisted laparoscopic total hysterectomy with bilateral salpingoophorectomy, SLN biopsy  Operative Findings:  : 9cm uterus (mildly broadened and bulky), normal appearing fallopian tubes and ovaries.       12/15/2019 Imaging   1. Status post hysterectomy. Soft tissue nodule in the right hemipelvis measuring 3.4 x 2.6 cm. Soft tissue nodule or left pelvic sidewall/iliac lymph node conglomerate measuring 2.8 x 2.1 cm. Findings are consistent with recurrent/metastatic disease. 2. Moderate volume ascites throughout the abdomen and pelvis with fine peritoneal nodularity noted in the pelvis and new omental or ventral peritoneal soft tissue thickening, consistent with peritoneal metastatic disease. 3. Moderate right hydronephrosis and hydroureter, the distal right ureter obstructed by right pelvic soft tissue mass noted above. 4. Aortic Atherosclerosis (ICD10-I70.0).   12/22/2019 Cancer Staging   Staging form: Corpus Uteri - Carcinoma and Carcinosarcoma, AJCC 8th Edition - Clinical stage from 12/22/2019: FIGO Stage IVB (rcT1a, cN0, cM1) - Signed by [name], MD on 12/22/2019   12/24/2019 Imaging   CT chest 1. No evidence of metastatic disease in the chest. 2. Stable 3 mm pulmonary nodule of the peripheral right lower lobe, almost certainly incidental and benign. Attention on follow-up.  3. Scarring of the left lung base. 4. Aortic Atherosclerosis (ICD10-I70.0).   12/29/2019 Procedure   Preoperative diagnosis:  1. Right ureteral obstruction 2. Metastatic endometrial cancer   Postoperative diagnosis:  1. Right ureteral obstruction 2. Metastatic endometrial cancer   Procedure:  1. Cystoscopy 2. Right ureteral stent placement (6 x 24-no string)  3. Right retrograde pyelography with  interpretation   Surgeon: Pryor Curia. M.D.    Intraoperative findings: Right retrograde pyelography was performed using a 6 French ureteral catheter and Omnipaque contrast.  This demonstrated severe deviation of the distal ureter laterally around the patient's pelvic mass.  The distal ureter was clearly compressed extrinsically without intrinsic filling defects.  The ureter was then significantly dilated above the pelvis with some tortuosity extending into a dilated renal collecting system.   12/31/2019 -  Chemotherapy   The patient had carboplatin and taxol for chemotherapy treatment.     01/08/2020 Genetic Testing   Genetic testing reported out on January 07, 2020 through the Common hereditary cancer panel found no pathogenic mutations     REVIEW OF SYSTEMS:   Constitutional: Denies fevers, chills or abnormal weight loss Eyes: Denies blurriness of vision Ears, nose, mouth, throat, and face: Denies mucositis or sore throat Respiratory: Denies cough, dyspnea or wheezes Cardiovascular: Denies palpitation, chest discomfort  Gastrointestinal:  Denies nausea, heartburn or change in bowel habits Skin: Denies abnormal skin rashes Lymphatics: Denies new lymphadenopathy or easy bruising Behavioral/Psych: Mood is stable, no new changes  All other systems were reviewed with the patient and are negative.  I have reviewed the past medical history, past surgical history, social history and family history with the patient and they are unchanged from previous note.  ALLERGIES:  is allergic to gadolinium and other.  MEDICATIONS:  Current Outpatient Medications  Medication Sig Dispense Refill  . acetaminophen (TYLENOL) 500 MG tablet Take 500 mg by mouth every 6 (six) hours as needed for moderate pain.     [name] alendronate (FOSAMAX) 70 MG tablet Take 70 mg by mouth once a week.     . Calcium Carb-Cholecalciferol (CALCIUM 600+D3) 600-200 MG-UNIT TABS Take 600 mg by mouth daily.     . Capsaicin  (ARTHRITIS PAIN RELIEF EX) Apply 1 application topically daily as needed (Arthritis pain).    [name] dexamethasone (DECADRON) 4 MG tablet Take 2 tabs at the night before and 2 tab the morning of chemotherapy, every 3 weeks, by mouth x 6 cycles 36 tablet 6  . Docusate Sodium (STOOL SOFTENER) 100 MG capsule Take 100 mg by mouth daily as needed for constipation.    . fluticasone (FLONASE) 50 MCG/ACT nasal spray Place 1 spray into both nostrils daily as needed for allergies or rhinitis.     . hydrocortisone cream 0.5 % Apply 1 application topically daily as needed for itching.    . levothyroxine (SYNTHROID) 88 MCG tablet Take 88 mcg by mouth every morning.    . loratadine (CLARITIN) 10 MG tablet Take 10 mg by mouth daily.    . Multiple Vitamin (MULTIVITAMIN) tablet Take 1 tablet by mouth daily.    . naphazoline-pheniramine (ALLERGY EYE) 0.025-0.3 % ophthalmic solution Place 1 drop into both eyes daily as needed for allergies.    [name] ondansetron (ZOFRAN) 8 MG tablet Take 1 tablet (8 mg total) by mouth every 8 (eight) hours as needed. Start on the third day after chemotherapy. 30 tablet 1  . oxyCODONE (OXY IR/ROXICODONE) 5 MG immediate release tablet Take 1 tablet (5 mg total) by mouth every 4 (four) hours as needed for severe pain. 50 tablet 0  . prochlorperazine (COMPAZINE) 10 MG tablet Take 1 tablet (10 mg total) by mouth every 6 (six) hours as needed (Nausea or vomiting). 30 tablet 1  . senna-docusate (  SENOKOT-S) 8.6-50 MG tablet Take 2 tablets by mouth at bedtime. For AFTER surgery, do not take if having diarrhea (Patient taking differently: Take 2 tablets by mouth at bedtime as needed for mild constipation or moderate constipation. ) 30 tablet 0   No current facility-administered medications for this visit.    PHYSICAL EXAMINATION: ECOG PERFORMANCE STATUS: 1 - Symptomatic but completely ambulatory  Vitals:   02/10/20 0914  BP: (!) 159/51  Pulse: 83  Resp: 18  Temp: 97.8 F (36.6 C)  SpO2: 100%    Filed Weights   02/10/20 0914  Weight: 128 lb (58.1 kg)    GENERAL:alert, no distress and comfortable SKIN: skin color, texture, turgor are normal, no rashes or significant lesions EYES: normal, Conjunctiva are pink and non-injected, sclera clear OROPHARYNX:no exudate, no erythema and lips, buccal mucosa, and tongue normal  NECK: supple, thyroid normal size, non-tender, without nodularity LYMPH:  no palpable lymphadenopathy in the cervical, axillary or inguinal LUNGS: clear to auscultation and percussion with normal breathing effort HEART: regular rate & rhythm and no murmurs with moderate left lower extremity edema ABDOMEN:abdomen soft, non-tender and normal bowel sounds Musculoskeletal:no cyanosis of digits and no clubbing  NEURO: alert & oriented x 3 with fluent speech, no focal motor/sensory deficits  LABORATORY DATA:  I have reviewed the data as listed    Component Value Date/Time   NA 138 02/10/2020 0855   NA 142 12/08/2014 1153   K 3.9 02/10/2020 0855   K 3.7 12/08/2014 1153   CL 103 02/10/2020 0855   CO2 28 02/10/2020 0855   CO2 29 12/08/2014 1153   GLUCOSE 90 02/10/2020 0855   GLUCOSE 84 12/08/2014 1153   BUN 7 (L) 02/10/2020 0855   BUN 8.6 12/08/2014 1153   CREATININE 0.68 02/10/2020 0855   CREATININE 0.7 12/08/2014 1153   CALCIUM 9.7 02/10/2020 0855   CALCIUM 9.3 12/08/2014 1153   PROT 8.1 02/10/2020 0855   PROT 7.2 12/08/2014 1153   ALBUMIN 3.6 02/10/2020 0855   ALBUMIN 3.8 12/08/2014 1153   AST 16 02/10/2020 0855   AST 21 12/08/2014 1153   ALT 9 02/10/2020 0855   ALT 9 12/08/2014 1153   ALKPHOS 65 02/10/2020 0855   ALKPHOS 63 12/08/2014 1153   BILITOT 0.4 02/10/2020 0855   BILITOT 0.56 12/08/2014 1153   GFRNONAA >60 02/10/2020 0855   GFRAA >60 02/10/2020 0855    No results found for: SPEP, UPEP  Lab Results  Component Value Date   WBC 2.4 (L) 02/10/2020   NEUTROABS 1.4 (L) 02/10/2020   HGB 8.8 (L) 02/10/2020   HCT 27.4 (L) 02/10/2020   MCV  106.2 (H) 02/10/2020   PLT 206 02/10/2020      Chemistry      Component Value Date/Time   NA 138 02/10/2020 0855   NA 142 12/08/2014 1153   K 3.9 02/10/2020 0855   K 3.7 12/08/2014 1153   CL 103 02/10/2020 0855   CO2 28 02/10/2020 0855   CO2 29 12/08/2014 1153   BUN 7 (L) 02/10/2020 0855   BUN 8.6 12/08/2014 1153   CREATININE 0.68 02/10/2020 0855   CREATININE 0.7 12/08/2014 1153      Component Value Date/Time   CALCIUM 9.7 02/10/2020 0855   CALCIUM 9.3 12/08/2014 1153   ALKPHOS 65 02/10/2020 0855   ALKPHOS 63 12/08/2014 1153   AST 16 02/10/2020 0855   AST 21 12/08/2014 1153   ALT 9 02/10/2020 0855   ALT 9 12/08/2014 1153  BILITOT 0.4 02/10/2020 0855   BILITOT 0.56 12/08/2014 1153

## 2020-02-10 NOTE — Assessment & Plan Note (Signed)
She has significant pancytopenia I plan to reduce the dose of both the carboplatin and the pocket Taxol She does not need transfusion support

## 2020-02-10 NOTE — Progress Notes (Signed)
Ok to treat today per Dr. Alvy Bimler with ANC of 1.4

## 2020-02-10 NOTE — Assessment & Plan Note (Signed)
Overall, she have positive response to treatment Her ascites has resolved Her intermittent leg swelling is not unexpected She is urinating well without difficulties She have less abdominal pain I will adjust the dose of treatment due to mild pancytopenia We discussed the risk and benefits of continuing treatment as the patient is undecided Ultimately, she agreed to proceed I plan to order CT imaging before her next visit

## 2020-02-10 NOTE — Patient Instructions (Signed)
[facility] Discharge Instructions for Patients Receiving Chemotherapy  Today you received the following chemotherapy agents Taxol, Carboplatin  To help prevent nausea and vomiting after your treatment, we encourage you to take your nausea medication.DO NOT TAKE ZOFRAN FOR THREE DAYS AFTER TREATMENT.   If you develop nausea and vomiting that is not controlled by your nausea medication, call the clinic.   BELOW ARE SYMPTOMS THAT SHOULD BE REPORTED IMMEDIATELY:  *FEVER GREATER THAN 100.5 F  *CHILLS WITH OR WITHOUT FEVER  NAUSEA AND VOMITING THAT IS NOT CONTROLLED WITH YOUR NAUSEA MEDICATION  *UNUSUAL SHORTNESS OF BREATH  *UNUSUAL BRUISING OR BLEEDING  TENDERNESS IN MOUTH AND THROAT WITH OR WITHOUT PRESENCE OF ULCERS  *URINARY PROBLEMS  *BOWEL PROBLEMS  UNUSUAL RASH Items with * indicate a potential emergency and should be followed up as soon as possible.  Feel free to call the clinic should you have any questions or concerns. The clinic phone number is [phone].  Please show the CHEMO ALERT CARD at check-in to the Emergency Department and triage nurse.   

## 2020-03-01 ENCOUNTER — Ambulatory Visit (HOSPITAL_COMMUNITY)
Admission: RE | Admit: 2020-03-01 | Discharge: 2020-03-01 | Disposition: A | Discharge status: Home / Self Care | Payer: Medicare Other | Source: Ambulatory Visit | Attending: Hematology and Oncology | Admitting: Hematology and Oncology

## 2020-03-01 ENCOUNTER — Other Ambulatory Visit: Payer: Self-pay

## 2020-03-01 ENCOUNTER — Encounter (HOSPITAL_COMMUNITY): Payer: Self-pay

## 2020-03-01 DIAGNOSIS — C541 Malignant neoplasm of endometrium: Secondary | ICD-10-CM | POA: Diagnosis not present

## 2020-03-01 MED ORDER — IOHEXOL 300 MG/ML  SOLN
100.0000 mL | Freq: Once | INTRAMUSCULAR | Status: AC | PRN
  Administered 2020-03-01: 100 mL via INTRAVENOUS

## 2020-03-02 ENCOUNTER — Encounter: Payer: Self-pay | Admitting: Hematology and Oncology

## 2020-03-02 ENCOUNTER — Inpatient Hospital Stay: Payer: Medicare Other | Attending: Gynecologic Oncology

## 2020-03-02 ENCOUNTER — Other Ambulatory Visit: Payer: Self-pay | Admitting: Hematology and Oncology

## 2020-03-02 ENCOUNTER — Inpatient Hospital Stay: Payer: Medicare Other

## 2020-03-02 ENCOUNTER — Inpatient Hospital Stay (HOSPITAL_BASED_OUTPATIENT_CLINIC_OR_DEPARTMENT_OTHER): Payer: Medicare Other | Admitting: Hematology and Oncology

## 2020-03-02 ENCOUNTER — Other Ambulatory Visit: Payer: Self-pay

## 2020-03-02 DIAGNOSIS — Z7189 Other specified counseling: Secondary | ICD-10-CM

## 2020-03-02 DIAGNOSIS — G620 Drug-induced polyneuropathy: Secondary | ICD-10-CM | POA: Insufficient documentation

## 2020-03-02 DIAGNOSIS — D61818 Other pancytopenia: Secondary | ICD-10-CM | POA: Diagnosis not present

## 2020-03-02 DIAGNOSIS — C50911 Malignant neoplasm of unspecified site of right female breast: Secondary | ICD-10-CM | POA: Diagnosis not present

## 2020-03-02 DIAGNOSIS — Z5111 Encounter for antineoplastic chemotherapy: Secondary | ICD-10-CM | POA: Diagnosis present

## 2020-03-02 DIAGNOSIS — C541 Malignant neoplasm of endometrium: Secondary | ICD-10-CM | POA: Diagnosis present

## 2020-03-02 DIAGNOSIS — T451X5A Adverse effect of antineoplastic and immunosuppressive drugs, initial encounter: Secondary | ICD-10-CM

## 2020-03-02 DIAGNOSIS — I824Z2 Acute embolism and thrombosis of unspecified deep veins of left distal lower extremity: Secondary | ICD-10-CM | POA: Insufficient documentation

## 2020-03-02 DIAGNOSIS — Z7901 Long term (current) use of anticoagulants: Secondary | ICD-10-CM | POA: Diagnosis not present

## 2020-03-02 DIAGNOSIS — Z171 Estrogen receptor negative status [ER-]: Secondary | ICD-10-CM | POA: Insufficient documentation

## 2020-03-02 DIAGNOSIS — M7989 Other specified soft tissue disorders: Secondary | ICD-10-CM

## 2020-03-02 DIAGNOSIS — I700 Atherosclerosis of aorta: Secondary | ICD-10-CM | POA: Insufficient documentation

## 2020-03-02 DIAGNOSIS — M5136 Other intervertebral disc degeneration, lumbar region: Secondary | ICD-10-CM | POA: Insufficient documentation

## 2020-03-02 DIAGNOSIS — J439 Emphysema, unspecified: Secondary | ICD-10-CM | POA: Diagnosis not present

## 2020-03-02 DIAGNOSIS — Z79899 Other long term (current) drug therapy: Secondary | ICD-10-CM | POA: Insufficient documentation

## 2020-03-02 DIAGNOSIS — N135 Crossing vessel and stricture of ureter without hydronephrosis: Secondary | ICD-10-CM | POA: Diagnosis not present

## 2020-03-02 LAB — CMP (CANCER CENTER ONLY)
ALT: 10 U/L (ref 0–44)
AST: 17 U/L (ref 15–41)
Albumin: 3.9 g/dL (ref 3.5–5.0)
Alkaline Phosphatase: 68 U/L (ref 38–126)
Anion gap: 5 (ref 5–15)
BUN: 10 mg/dL (ref 8–23)
CO2: 30 mmol/L (ref 22–32)
Calcium: 10 mg/dL (ref 8.9–10.3)
Chloride: 104 mmol/L (ref 98–111)
Creatinine: 0.74 mg/dL (ref 0.44–1.00)
GFR, Estimated: >60 mL/min (ref >60)
Glucose, Bld: 132 mg/dL — ABNORMAL HIGH (ref 70–99)
Potassium: 3.6 mmol/L (ref 3.5–5.1)
Sodium: 139 mmol/L (ref 135–145)
Total Bilirubin: 0.3 mg/dL (ref 0.3–1.2)
Total Protein: 8.6 g/dL — ABNORMAL HIGH (ref 6.5–8.1)

## 2020-03-02 LAB — CBC WITH DIFFERENTIAL (CANCER CENTER ONLY)
Abs Immature Granulocytes: 0.01 10*3/uL (ref 0.00–0.07)
Basophils Absolute: 0 10*3/uL (ref 0.0–0.1)
Basophils Relative: 0 %
Eosinophils Absolute: 0 10*3/uL (ref 0.0–0.5)
Eosinophils Relative: 0 %
HCT: 31.2 % — ABNORMAL LOW (ref 36.0–46.0)
Hemoglobin: 10 g/dL — ABNORMAL LOW (ref 12.0–15.0)
Immature Granulocytes: 0 %
Lymphocytes Relative: 22 %
Lymphs Abs: 0.5 10*3/uL — ABNORMAL LOW (ref 0.7–4.0)
MCH: 35.2 pg — ABNORMAL HIGH (ref 26.0–34.0)
MCHC: 32.1 g/dL (ref 30.0–36.0)
MCV: 109.9 fL — ABNORMAL HIGH (ref 80.0–100.0)
Monocytes Absolute: 0.2 10*3/uL (ref 0.1–1.0)
Monocytes Relative: 7 %
Neutro Abs: 1.7 10*3/uL (ref 1.7–7.7)
Neutrophils Relative %: 71 %
Platelet Count: 192 10*3/uL (ref 150–400)
RBC: 2.84 MIL/uL — ABNORMAL LOW (ref 3.87–5.11)
RDW: 13.9 % (ref 11.5–15.5)
WBC Count: 2.5 10*3/uL — ABNORMAL LOW (ref 4.0–10.5)
nRBC: 0 % (ref 0.0–0.2)

## 2020-03-02 MED ORDER — PALONOSETRON HCL INJECTION 0.25 MG/5ML
0.2500 mg | Freq: Once | INTRAVENOUS | Status: AC
  Administered 2020-03-02: 0.25 mg via INTRAVENOUS

## 2020-03-02 MED ORDER — FAMOTIDINE IN NACL 20-0.9 MG/50ML-% IV SOLN
20.0000 mg | Freq: Once | INTRAVENOUS | Status: AC
  Administered 2020-03-02: 20 mg via INTRAVENOUS

## 2020-03-02 MED ORDER — SODIUM CHLORIDE 0.9 % IV SOLN
355.5000 mg | Freq: Once | INTRAVENOUS | Status: AC
  Administered 2020-03-02: 360 mg via INTRAVENOUS
  Filled 2020-03-02: qty 36

## 2020-03-02 MED ORDER — SODIUM CHLORIDE 0.9 % IV SOLN
10.0000 mg | Freq: Once | INTRAVENOUS | Status: AC
  Administered 2020-03-02: 10 mg via INTRAVENOUS
  Filled 2020-03-02: qty 10

## 2020-03-02 MED ORDER — RIVAROXABAN (XARELTO) VTE STARTER PACK (15 & 20 MG): ORAL_TABLET | ORAL | 0 refills | Status: DC

## 2020-03-02 MED ORDER — SODIUM CHLORIDE 0.9 % IV SOLN
131.2500 mg/m2 | Freq: Once | INTRAVENOUS | Status: AC
  Administered 2020-03-02: 204 mg via INTRAVENOUS
  Filled 2020-03-02: qty 34

## 2020-03-02 MED ORDER — SODIUM CHLORIDE 0.9 % IV SOLN
Freq: Once | INTRAVENOUS | Status: AC
  Filled 2020-03-02: qty 250

## 2020-03-02 MED ORDER — SODIUM CHLORIDE 0.9 % IV SOLN
150.0000 mg | Freq: Once | INTRAVENOUS | Status: AC
  Administered 2020-03-02: 150 mg via INTRAVENOUS
  Filled 2020-03-02: qty 150

## 2020-03-02 MED ORDER — FAMOTIDINE IN NACL 20-0.9 MG/50ML-% IV SOLN
INTRAVENOUS | Status: AC
  Filled 2020-03-02: qty 50

## 2020-03-02 MED ORDER — DIPHENHYDRAMINE HCL 50 MG/ML IJ SOLN
12.5000 mg | Freq: Once | INTRAMUSCULAR | Status: AC
  Administered 2020-03-02: 12.5 mg via INTRAVENOUS

## 2020-03-02 MED ORDER — PALONOSETRON HCL INJECTION 0.25 MG/5ML
INTRAVENOUS | Status: AC
  Filled 2020-03-02: qty 5

## 2020-03-02 MED ORDER — DIPHENHYDRAMINE HCL 50 MG/ML IJ SOLN
INTRAMUSCULAR | Status: AC
  Filled 2020-03-02: qty 1

## 2020-03-02 MED FILL — XARELTO STARTER PACK: 15 & 20 | 30 days supply | Qty: 51 | Fill #0

## 2020-03-02 NOTE — Assessment & Plan Note (Signed)
She was found to have acute DVT on recent imaging study Her previous ultrasound venous Doppler prior to chemotherapy did not reveal signs of DVT Given recent findings and her symptoms, I recommend we proceed with anticoagulation therapy We discussed the risk, benefits, side effects of Xarelto and she is in agreement to proceed I educated the patient and family members to watch out for signs and symptoms of bleeding

## 2020-03-02 NOTE — Progress Notes (Signed)
[name] OFF[facility] PROGRESS NOTE  Patient Care Team: [name], MD as PCP - General (Family Medicine)  ASSESSMENT & PLAN:  Endometrial cancer [name]) I have reviewed multiple imaging studies with the patient and family She has significant positive response to therapy Overall, she tolerated treatment well except for some mild pancytopenia We will proceed with 3 more cycles of treatment  Pancytopenia, acquired ([place]) I adjusted the dose of her chemotherapy recently She tolerated that better We will continue reduced dose treatment She does not need transfusion support  Peripheral neuropathy due to chemotherapy [name]) She is having some peripheral neuropathy I plan to continue on reduced dose of paclitaxel  Acute deep vein thrombosis (DVT) of distal vein of left lower extremity ([name]) She was found to have acute DVT on recent imaging study Her previous ultrasound venous Doppler prior to chemotherapy did not reveal signs of DVT Given recent findings and her symptoms, I recommend we proceed with anticoagulation therapy We discussed the risk, benefits, side effects of Xarelto and she is in agreement to proceed I educated the patient and family members to watch out for signs and symptoms of bleeding  Goals of care, counseling/discussion Reviewed plans and goals of care with the patient and family and they are in agreement with the current plan   No orders of the defined types were placed in this encounter.   All questions were answered. The patient knows to call the clinic with any problems, questions or concerns. The total time spent in the appointment was 40 minutes encounter with patients including review of chart and various tests results, discussions about plan of care and coordination of care plan   [name], MD 03/02/2020 11:29 AM  INTERVAL HISTORY: Please see below for problem oriented charting. She returns with her son for further follow-up on recent  imaging study and to continue on chemotherapy Her daughter is also available over the phone to consult She continues to complain of leg pain and swelling on the left over the past month Denies worsening peripheral neuropathy No recent infection, fever or chills Her appetite is fair  SUMMARY OF ONCOLOGIC HISTORY: Oncology History Overview Note  ER/PR negative, MSI stable Genetic testing reported out on January 07, 2020 through the Common hereditary cancer panel found no pathogenic mutations   Breast cancer, right breast ([name])  03/13/2014 Initial Diagnosis   Breast cancer, right breast ([name])   01/07/2020 Genetic Testing   Negative genetic testing.  RAD50 c.1556C>T VUS identified.  The Common Hereditary Gene Panel offered by [facility] includes sequencing and/or deletion duplication testing of the following 48 genes: APC, ATM, AXIN2, BARD1, BMPR1A, BRCA1, BRCA2, BRIP1, CDH1, CDK4, CDKN2A (p14ARF), CDKN2A (p16INK4a), CHEK2, CTNNA1, DICER1, EPCAM (Deletion/duplication testing only), GREM1 (promoter region deletion/duplication testing only), KIT, MEN1, MLH1, MSH2, MSH3, MSH6, MUTYH, NBN, NF1, NHTL1, PALB2, PDGFRA, PMS2, POLD1, POLE, PTEN, RAD50, RAD51C, RAD51D, RNF43, SDHB, SDHC, SDHD, SMAD4, SMARCA4. STK11, TP53, TSC1, TSC2, and VHL.  The following genes were evaluated for sequence changes only: SDHA and HOXB13 c.251G>A variant only. The report date is January 07, 2020.   Endometrial cancer ([name])  01/08/2019 Imaging   Hypermetabolic activity within central uterus, consistent with known endometrial carcinoma.   No evidence of local or distant metastatic disease.     01/16/2019 Pathology Results   1. Lymph node, sentinel, biopsy, left obturator - NO CARCINOMA IDENTIFIED IN ONE LYMPH NODE (0/1) - SEE COMMENT 2. Lymph node, sentinel, biopsy, right obturator - NO CARCINOMA IDENTIFIED IN ONE  LYMPH NODE (0/1) - SEE COMMENT 3. Uterus +/- tubes/ovaries, neoplastic, cervix, bilateral fallopian tubes and  ovaries UTERUS: - ENDOMETRIOID CARCINOMA, FIGO GRADE 3, CONFINED TO THE UTERUS - SEE ONCOLOGY TABLE BELOW CERVIX: - NO CARCINOMA IDENTIFIED BILATERAL OVARIES: - NO CARCINOMA IDENTIFIED BILATERAL FALLOPIAN TUBES: - BENIGN PARATUBAL CYSTS (RIGHT) - NO CARCINOMA IDENTIFIED Microscopic Comment 1. and 2. Cytokeratin AE1/3 was performed on the sentinel lymph nodes to exclude micrometastasis. There is no evidence of metastatic carcinoma by immunohistochemistry. 3. UTERUS, CARCINOMA OR CARCINOSARCOMA Procedure: Hysterectomy and bilateral salpingo-oophorectomy Histologic type: Endometrioid carcinoma Histologic Grade: FIGO Grade 3 Myometrial invasion: Depth of invasion: 4 mm 1 ofMyometrial thickness: 27 mm Uterine Serosa Involvement: Not identified Cervical stromal involvement: Not identified Extent of involvement of other organs: N/A Lymphovascular invasion: Not identified Regional Lymph Nodes: Examined: 2 Sentinel 0 Non-sentinel 2 Total Lymph nodes with metastasis: N/A Isolated tumor cells (< 0.2 mm): 0 Micrometastasis: (> 0.2 mm and < 2.0 mm): 0 Macrometastasis: (> 2.0 mm): 0 Extracapsular extension: N/A Tumor block for ancillary studies: 3C MMR / MSI testing: Pending Pathologic Stage Classification (pTNM, AJCC 8th edition): pT1a, pN0 FIGO Stage: FIGO Ia   01/16/2019 Surgery   Surgeon: Donaciano Eva  Pre-operative Diagnosis: endometrial cancer grade 3    Operation: Robotic-assisted laparoscopic total hysterectomy with bilateral salpingoophorectomy, SLN biopsy  Operative Findings:  : 9cm uterus (mildly broadened and bulky), normal appearing fallopian tubes and ovaries.       12/15/2019 Imaging   1. Status post hysterectomy. Soft tissue nodule in the right hemipelvis measuring 3.4 x 2.6 cm. Soft tissue nodule or left pelvic sidewall/iliac lymph node conglomerate measuring 2.8 x 2.1 cm. Findings are consistent with recurrent/metastatic disease. 2. Moderate volume ascites  throughout the abdomen and pelvis with fine peritoneal nodularity noted in the pelvis and new omental or ventral peritoneal soft tissue thickening, consistent with peritoneal metastatic disease. 3. Moderate right hydronephrosis and hydroureter, the distal right ureter obstructed by right pelvic soft tissue mass noted above. 4. Aortic Atherosclerosis (ICD10-I70.0).   12/22/2019 Cancer Staging   Staging form: Corpus Uteri - Carcinoma and Carcinosarcoma, AJCC 8th Edition - Clinical stage from 12/22/2019: FIGO Stage IVB (rcT1a, cN0, cM1) - Signed by [name], MD on 12/22/2019   12/24/2019 Imaging   CT chest 1. No evidence of metastatic disease in the chest. 2. Stable 3 mm pulmonary nodule of the peripheral right lower lobe, almost certainly incidental and benign. Attention on follow-up.  3. Scarring of the left lung base. 4. Aortic Atherosclerosis (ICD10-I70.0).   12/29/2019 Procedure   Preoperative diagnosis:  1. Right ureteral obstruction 2. Metastatic endometrial cancer   Postoperative diagnosis:  1. Right ureteral obstruction 2. Metastatic endometrial cancer   Procedure:   1. Cystoscopy 2. Right ureteral stent placement (6 x 24-no string)  3. Right retrograde pyelography with interpretation   Surgeon: Pryor Curia. M.D.    Intraoperative findings: Right retrograde pyelography was performed using a 6 French ureteral catheter and Omnipaque contrast.  This demonstrated severe deviation of the distal ureter laterally around the patient's pelvic mass.  The distal ureter was clearly compressed extrinsically without intrinsic filling defects.  The ureter was then significantly dilated above the pelvis with some tortuosity extending into a dilated renal collecting system.   12/31/2019 -  Chemotherapy   The patient had carboplatin and taxol for chemotherapy treatment.     01/08/2020 Genetic Testing   Genetic testing reported out on January 07, 2020 through the Common hereditary cancer  panel found no pathogenic mutations   03/01/2020 Imaging   1. Overall significant improvement, with reduced size of the pelvic adenopathy, reduced conspicuity of prior peritoneal tumor deposits, and resolution of the prior ascites. 2. Right double-J ureteral stent in place with resolution of the prior right hydronephrosis and right hydroureter. 3. Heterogeneous density in the left external iliac and common femoral vein could be from mixing of opacified and unopacified venous blood versus DVT. Consider left lower extremity Doppler venous ultrasound for further workup. 4. Other imaging findings of potential clinical significance: Mild cardiomegaly. Pancreas divisum. Lumbar spondylosis and degenerative disc disease causing multilevel impingement. Subglandular nodularity in the right breast for example on image 6 of series 2 was not hypermetabolic on prior PET-CT of 01/08/2019. 5. Emphysema and aortic atherosclerosis.       REVIEW OF SYSTEMS:   Constitutional: Denies fevers, chills or abnormal weight loss Eyes: Denies blurriness of vision Ears, nose, mouth, throat, and face: Denies mucositis or sore throat Respiratory: Denies cough, dyspnea or wheezes  Cardiovascular: Denies palpitation, chest discomfort  Gastrointestinal:  Denies nausea, heartburn or change in bowel habits Skin: Denies abnormal skin rashes Lymphatics: Denies new lymphadenopathy or easy bruising Behavioral/Psych: Mood is stable, no new changes  All other systems were reviewed with the patient and are negative.  I have reviewed the past medical history, past surgical history, social history and family history with the patient and they are unchanged from previous note.  ALLERGIES:  is allergic to gadolinium and other.  MEDICATIONS:  Current Outpatient Medications  Medication Sig Dispense Refill  . acetaminophen (TYLENOL) 500 MG tablet Take 500 mg by mouth every 6 (six) hours as needed for moderate pain.     [name] alendronate  (FOSAMAX) 70 MG tablet Take 70 mg by mouth once a week.     . Calcium Carb-Cholecalciferol (CALCIUM 600+D3) 600-200 MG-UNIT TABS Take 600 mg by mouth daily.     . Capsaicin (ARTHRITIS PAIN RELIEF EX) Apply 1 application topically daily as needed (Arthritis pain).    [name] dexamethasone (DECADRON) 4 MG tablet Take 2 tabs at the night before and 2 tab the morning of chemotherapy, every 3 weeks, by mouth x 6 cycles 36 tablet 6  . Docusate Sodium (STOOL SOFTENER) 100 MG capsule Take 100 mg by mouth daily as needed for constipation.    . fluticasone (FLONASE) 50 MCG/ACT nasal spray Place 1 spray into both nostrils daily as needed for allergies or rhinitis.     . hydrocortisone cream 0.5 % Apply 1 application topically daily as needed for itching.    . levothyroxine (SYNTHROID) 88 MCG tablet Take 88 mcg by mouth every morning.    . loratadine (CLARITIN) 10 MG tablet Take 10 mg by mouth daily.    . Multiple Vitamin (MULTIVITAMIN) tablet Take 1 tablet by mouth daily.    . naphazoline-pheniramine (ALLERGY EYE) 0.025-0.3 % ophthalmic solution Place 1 drop into both eyes daily as needed for allergies.    [name] ondansetron (ZOFRAN) 8 MG tablet Take 1 tablet (8 mg total) by mouth every 8 (eight) hours as needed. Start on the third day after chemotherapy. 30 tablet 1  . oxyCODONE (OXY IR/ROXICODONE) 5 MG immediate release tablet Take 1 tablet (5 mg total) by mouth every 4 (four) hours as needed for severe pain. 50 tablet 0  . prochlorperazine (COMPAZINE) 10 MG tablet Take 1 tablet (10 mg total) by mouth every 6 (six) hours as needed (Nausea or vomiting). 30 tablet 1  . RIVAROXABAN (  XARELTO) VTE STARTER PACK (15 & 20 MG TABLETS) Follow package directions: Take one 18m tablet by mouth twice a day. On day 22, switch to one 244mtablet once a day. Take with food. 51 each 0  . senna-docusate (SENOKOT-S) 8.6-50 MG tablet Take 2 tablets by mouth at bedtime. For AFTER surgery, do not take if having diarrhea (Patient taking  differently: Take 2 tablets by mouth at bedtime as needed for mild constipation or moderate constipation. ) 30 tablet 0   No current facility-administered medications for this visit.   Facility-Administered Medications Ordered in Other Visits  Medication Dose Route Frequency Provider Last Rate Last Admin  . CARBOplatin (PARAPLATIN) 360 mg in sodium chloride 0.9 % 250 mL chemo infusion  360 mg Intravenous Once GoAlvy BimlerNi, MD      . fosaprepitant (EMEND) 150 mg in sodium chloride 0.9 % 145 mL IVPB  150 mg Intravenous Once GoAlvy BimlerNi, MD 450 mL/hr at 03/02/20 1124 150 mg at 03/02/20 1124  . PACLitaxel (TAXOL) 204 mg in sodium chloride 0.9 % 250 mL chemo infusion (> 8030m2)  131.25 mg/m2 (Treatment Plan Recorded) Intravenous Once GorHeath LarkD        PHYSICAL EXAMINATION: ECOG PERFORMANCE STATUS: 1 - Symptomatic but completely ambulatory  Vitals:   03/02/20 0928  BP: (!) 159/60  Pulse: 72  Resp: 18  Temp: 97.9 F (36.6 C)  SpO2: 100%   Filed Weights   03/02/20 0928  Weight: 124 lb 12.8 oz (56.6 kg)    GENERAL:alert, no distress and comfortable NEURO: alert & oriented x 3 with fluent speech, no focal motor/sensory deficits  LABORATORY DATA:  I have reviewed the data as listed    Component Value Date/Time   NA 139 03/02/2020 0859   NA 142 12/08/2014 1153   K 3.6 03/02/2020 0859   K 3.7 12/08/2014 1153   CL 104 03/02/2020 0859   CO2 30 03/02/2020 0859   CO2 29 12/08/2014 1153   GLUCOSE 132 (H) 03/02/2020 0859   GLUCOSE 84 12/08/2014 1153   BUN 10 03/02/2020 0859   BUN 8.6 12/08/2014 1153   CREATININE 0.74 03/02/2020 0859   CREATININE 0.7 12/08/2014 1153   CALCIUM 10.0 03/02/2020 0859   CALCIUM 9.3 12/08/2014 1153   PROT 8.6 (H) 03/02/2020 0859   PROT 7.2 12/08/2014 1153   ALBUMIN 3.9 03/02/2020 0859   ALBUMIN 3.8 12/08/2014 1153   AST 17 03/02/2020 0859   AST 21 12/08/2014 1153   ALT 10 03/02/2020 0859   ALT 9 12/08/2014 1153   ALKPHOS 68 03/02/2020 0859    ALKPHOS 63 12/08/2014 1153   BILITOT 0.3 03/02/2020 0859   BILITOT 0.56 12/08/2014 1153   GFRNONAA >60 03/02/2020 0859   GFRAA >60 02/10/2020 0855    No results found for: SPEP, UPEP  Lab Results  Component Value Date   WBC 2.5 (L) 03/02/2020   NEUTROABS 1.7 03/02/2020   HGB 10.0 (L) 03/02/2020   HCT 31.2 (L) 03/02/2020   MCV 109.9 (H) 03/02/2020   PLT 192 03/02/2020      Chemistry      Component Value Date/Time   NA 139 03/02/2020 0859   NA 142 12/08/2014 1153   K 3.6 03/02/2020 0859   K 3.7 12/08/2014 1153   CL 104 03/02/2020 0859   CO2 30 03/02/2020 0859   CO2 29 12/08/2014 1153   BUN 10 03/02/2020 0859   BUN 8.6 12/08/2014 1153   CREATININE 0.74 03/02/2020 0859   CREATININE 0.7  12/08/2014 1153      Component Value Date/Time   CALCIUM 10.0 03/02/2020 0859   CALCIUM 9.3 12/08/2014 1153   ALKPHOS 68 03/02/2020 0859   ALKPHOS 63 12/08/2014 1153   AST 17 03/02/2020 0859   AST 21 12/08/2014 1153   ALT 10 03/02/2020 0859   ALT 9 12/08/2014 1153   BILITOT 0.3 03/02/2020 0859   BILITOT 0.56 12/08/2014 1153       RADIOGRAPHIC STUDIES: I have reviewed multiple imaging studies with the patient and family members. I have personally reviewed the radiological images as listed and agreed with the findings in the report. CT ABDOMEN PELVIS W CONTRAST  Result Date: 03/01/2020 CLINICAL DATA:  Metastatic endometrial cancer restaging. Radiation therapy completed in 2020. Prior hysterectomy. Occasional constipation. Remote history of breast cancer. EXAM: CT ABDOMEN AND PELVIS WITH CONTRAST TECHNIQUE: Multidetector CT imaging of the abdomen and pelvis was performed using the standard protocol following bolus administration of intravenous contrast. CONTRAST:  133m OMNIPAQUE IOHEXOL 300 MG/ML  SOLN COMPARISON:  Multiple exams, including 12/15/2019 FINDINGS: Lower chest: We partially include the 3 mm right lower lobe nodule on image 1 of series 6, no appreciable change in morphology.  Centrilobular emphysema. Mild subpleural reticulation in the right middle lobe is chronic. Mild cardiomegaly. Descending thoracic aortic atherosclerotic calcification. Subglandular nodularity in the right breast for example on image 6 of series 2 was not hypermetabolic on prior PET-CT of 01/08/2019. Hepatobiliary: Unremarkable Pancreas: Pancreas divisum. Chronic borderline prominence of the dorsal pancreatic duct. Spleen: Unremarkable Adrenals/Urinary Tract: Right double-J ureteral stent is in place, with resolution of the prior right hydronephrosis and right hydroureter. Adrenal glands unremarkable. Stomach/Bowel: Unremarkable Vascular/Lymphatic: Aortoiliac atherosclerotic vascular disease. Heterogeneous density in the left external iliac and common femoral vein could be from mixing of opacified and unopacified venous blood versus DVT. Ill-defined right external iliac node 1.1 cm in short axis on image 57 of series 2, previously 1.9 cm. Ill-defined left pelvic sidewall/external iliac node 1.1 cm in short axis on image 62 of series 2, formerly 2.1 cm. Ill-defined right pelvic sidewall lymph node adjacent to the ureter approximately 1.2 cm in short axis on image 64 of series 2, previously 2.6 cm. Reproductive: Uterus absent. Vaginal cuff obscured by unopacified loops of adjacent bowel. Other: Paucity of intra-adipose tissue and resolution of the prior ascites. This causes some difficulty in separation of adjacent bowel loops and also reduce conspicuity of prior suspected peritoneal tumor deposits. A tumor deposit previously in the perirectal space which measured about 2.1 by 1.7 cm on 12/15/2019 is not well appreciated on today's exam. The density along the right internal inguinal ring is likewise not well seen today. Reduce conspicuity of previous peritoneal nodularity. Musculoskeletal: Degenerative SI joint arthropathy. Postoperative findings in the lower lumbar spine. Mild levoconvex lumbar scoliosis with rotary  component. Lumbar spondylosis and degenerative disc disease result in multilevel impingement. IMPRESSION: 1. Overall significant improvement, with reduced size of the pelvic adenopathy, reduced conspicuity of prior peritoneal tumor deposits, and resolution of the prior ascites. 2. Right double-J ureteral stent in place with resolution of the prior right hydronephrosis and right hydroureter. 3. Heterogeneous density in the left external iliac and common femoral vein could be from mixing of opacified and unopacified venous blood versus DVT. Consider left lower extremity Doppler venous ultrasound for further workup. 4. Other imaging findings of potential clinical significance: Mild cardiomegaly. Pancreas divisum. Lumbar spondylosis and degenerative disc disease causing multilevel impingement. Subglandular nodularity in the right breast for example on  image 6 of series 2 was not hypermetabolic on prior PET-CT of 01/08/2019. 5. Emphysema and aortic atherosclerosis. Aortic Atherosclerosis (ICD10-I70.0) and Emphysema (ICD10-J43.9). Electronically Signed   By: Van Clines M.D.   On: 03/01/2020 09:29

## 2020-03-02 NOTE — Assessment & Plan Note (Signed)
Reviewed plans and goals of care with the patient and family and they are in agreement with the current plan

## 2020-03-02 NOTE — Assessment & Plan Note (Signed)
I adjusted the dose of her chemotherapy recently She tolerated that better We will continue reduced dose treatment She does not need transfusion support 

## 2020-03-02 NOTE — Assessment & Plan Note (Signed)
She is having some peripheral neuropathy I plan to continue on reduced dose of paclitaxel 

## 2020-03-02 NOTE — Patient Instructions (Signed)
[facility] Discharge Instructions for Patients Receiving Chemotherapy  Today you received the following chemotherapy agents Taxol, Carboplatin  To help prevent nausea and vomiting after your treatment, we encourage you to take your nausea medication.DO NOT TAKE ZOFRAN FOR THREE DAYS AFTER TREATMENT.   If you develop nausea and vomiting that is not controlled by your nausea medication, call the clinic.   BELOW ARE SYMPTOMS THAT SHOULD BE REPORTED IMMEDIATELY:  *FEVER GREATER THAN 100.5 F  *CHILLS WITH OR WITHOUT FEVER  NAUSEA AND VOMITING THAT IS NOT CONTROLLED WITH YOUR NAUSEA MEDICATION  *UNUSUAL SHORTNESS OF BREATH  *UNUSUAL BRUISING OR BLEEDING  TENDERNESS IN MOUTH AND THROAT WITH OR WITHOUT PRESENCE OF ULCERS  *URINARY PROBLEMS  *BOWEL PROBLEMS  UNUSUAL RASH Items with * indicate a potential emergency and should be followed up as soon as possible.  Feel free to call the clinic should you have any questions or concerns. The clinic phone number is [phone].  Please show the CHEMO ALERT CARD at check-in to the Emergency Department and triage nurse.   

## 2020-03-02 NOTE — Assessment & Plan Note (Signed)
I have reviewed multiple imaging studies with the patient and family She has significant positive response to therapy Overall, she tolerated treatment well except for some mild pancytopenia We will proceed with 3 more cycles of treatment

## 2020-03-18 ENCOUNTER — Other Ambulatory Visit: Payer: Self-pay | Admitting: Family Medicine

## 2020-03-18 ENCOUNTER — Telehealth: Payer: Self-pay | Admitting: *Deleted

## 2020-03-18 DIAGNOSIS — Z1231 Encounter for screening mammogram for malignant neoplasm of breast: Secondary | ICD-10-CM

## 2020-03-18 MED ORDER — RIVAROXABAN 20 MG PO TABS: 20.0000 mg | ORAL_TABLET | Freq: Every day | ORAL | 1 refills | Status: DC

## 2020-03-18 NOTE — Telephone Encounter (Signed)
Pt called stating that she has one pill left of her xarelto & no refills.  She is concerned about cost.  Informed that new script will be sent but if cost prohibitive when she goes to p/u to contact our office in am & we can see what we can do to help.  Explained that she may need to fill out some papers regarding income.

## 2020-03-23 ENCOUNTER — Other Ambulatory Visit: Payer: Self-pay

## 2020-03-23 ENCOUNTER — Inpatient Hospital Stay: Payer: Medicare Other | Attending: Gynecologic Oncology

## 2020-03-23 ENCOUNTER — Encounter: Payer: Self-pay | Admitting: Hematology and Oncology

## 2020-03-23 ENCOUNTER — Inpatient Hospital Stay: Payer: Medicare Other

## 2020-03-23 ENCOUNTER — Inpatient Hospital Stay (HOSPITAL_BASED_OUTPATIENT_CLINIC_OR_DEPARTMENT_OTHER): Payer: Medicare Other | Admitting: Hematology and Oncology

## 2020-03-23 DIAGNOSIS — G893 Neoplasm related pain (acute) (chronic): Secondary | ICD-10-CM | POA: Insufficient documentation

## 2020-03-23 DIAGNOSIS — N135 Crossing vessel and stricture of ureter without hydronephrosis: Secondary | ICD-10-CM | POA: Insufficient documentation

## 2020-03-23 DIAGNOSIS — I824Z2 Acute embolism and thrombosis of unspecified deep veins of left distal lower extremity: Secondary | ICD-10-CM

## 2020-03-23 DIAGNOSIS — Z7901 Long term (current) use of anticoagulants: Secondary | ICD-10-CM | POA: Insufficient documentation

## 2020-03-23 DIAGNOSIS — Z79899 Other long term (current) drug therapy: Secondary | ICD-10-CM | POA: Diagnosis not present

## 2020-03-23 DIAGNOSIS — I700 Atherosclerosis of aorta: Secondary | ICD-10-CM | POA: Insufficient documentation

## 2020-03-23 DIAGNOSIS — R911 Solitary pulmonary nodule: Secondary | ICD-10-CM | POA: Insufficient documentation

## 2020-03-23 DIAGNOSIS — Z5111 Encounter for antineoplastic chemotherapy: Secondary | ICD-10-CM | POA: Diagnosis not present

## 2020-03-23 DIAGNOSIS — G620 Drug-induced polyneuropathy: Secondary | ICD-10-CM | POA: Diagnosis not present

## 2020-03-23 DIAGNOSIS — D61818 Other pancytopenia: Secondary | ICD-10-CM | POA: Diagnosis not present

## 2020-03-23 DIAGNOSIS — C541 Malignant neoplasm of endometrium: Secondary | ICD-10-CM | POA: Insufficient documentation

## 2020-03-23 DIAGNOSIS — Z86718 Personal history of other venous thrombosis and embolism: Secondary | ICD-10-CM | POA: Diagnosis not present

## 2020-03-23 DIAGNOSIS — T451X5A Adverse effect of antineoplastic and immunosuppressive drugs, initial encounter: Secondary | ICD-10-CM

## 2020-03-23 DIAGNOSIS — M7989 Other specified soft tissue disorders: Secondary | ICD-10-CM

## 2020-03-23 DIAGNOSIS — Z7189 Other specified counseling: Secondary | ICD-10-CM

## 2020-03-23 DIAGNOSIS — Z853 Personal history of malignant neoplasm of breast: Secondary | ICD-10-CM | POA: Insufficient documentation

## 2020-03-23 LAB — CBC WITH DIFFERENTIAL (CANCER CENTER ONLY)
Abs Immature Granulocytes: 0.01 10*3/uL (ref 0.00–0.07)
Basophils Absolute: 0 10*3/uL (ref 0.0–0.1)
Basophils Relative: 0 %
Eosinophils Absolute: 0 10*3/uL (ref 0.0–0.5)
Eosinophils Relative: 1 %
HCT: 29.2 % — ABNORMAL LOW (ref 36.0–46.0)
Hemoglobin: 9.3 g/dL — ABNORMAL LOW (ref 12.0–15.0)
Immature Granulocytes: 0 %
Lymphocytes Relative: 29 %
Lymphs Abs: 0.8 10*3/uL (ref 0.7–4.0)
MCH: 35 pg — ABNORMAL HIGH (ref 26.0–34.0)
MCHC: 31.8 g/dL (ref 30.0–36.0)
MCV: 109.8 fL — ABNORMAL HIGH (ref 80.0–100.0)
Monocytes Absolute: 0.3 10*3/uL (ref 0.1–1.0)
Monocytes Relative: 12 %
Neutro Abs: 1.6 10*3/uL — ABNORMAL LOW (ref 1.7–7.7)
Neutrophils Relative %: 58 %
Platelet Count: 180 10*3/uL (ref 150–400)
RBC: 2.66 MIL/uL — ABNORMAL LOW (ref 3.87–5.11)
RDW: 13.7 % (ref 11.5–15.5)
WBC Count: 2.8 10*3/uL — ABNORMAL LOW (ref 4.0–10.5)
nRBC: 0 % (ref 0.0–0.2)

## 2020-03-23 LAB — CMP (CANCER CENTER ONLY)
ALT: 8 U/L (ref 0–44)
AST: 18 U/L (ref 15–41)
Albumin: 3.7 g/dL (ref 3.5–5.0)
Alkaline Phosphatase: 63 U/L (ref 38–126)
Anion gap: 6 (ref 5–15)
BUN: 11 mg/dL (ref 8–23)
CO2: 30 mmol/L (ref 22–32)
Calcium: 9.6 mg/dL (ref 8.9–10.3)
Chloride: 104 mmol/L (ref 98–111)
Creatinine: 0.72 mg/dL (ref 0.44–1.00)
GFR, Estimated: >60 mL/min (ref >60)
Glucose, Bld: 84 mg/dL (ref 70–99)
Potassium: 3.6 mmol/L (ref 3.5–5.1)
Sodium: 140 mmol/L (ref 135–145)
Total Bilirubin: 0.4 mg/dL (ref 0.3–1.2)
Total Protein: 8 g/dL (ref 6.5–8.1)

## 2020-03-23 MED ORDER — DIPHENHYDRAMINE HCL 50 MG/ML IJ SOLN
12.5000 mg | Freq: Once | INTRAMUSCULAR | Status: AC
  Administered 2020-03-23: 12.5 mg via INTRAVENOUS

## 2020-03-23 MED ORDER — PALONOSETRON HCL INJECTION 0.25 MG/5ML
0.2500 mg | Freq: Once | INTRAVENOUS | Status: AC
  Administered 2020-03-23: 0.25 mg via INTRAVENOUS

## 2020-03-23 MED ORDER — SODIUM CHLORIDE 0.9 % IV SOLN
150.0000 mg | Freq: Once | INTRAVENOUS | Status: AC
  Administered 2020-03-23: 150 mg via INTRAVENOUS
  Filled 2020-03-23: qty 150

## 2020-03-23 MED ORDER — SODIUM CHLORIDE 0.9 % IV SOLN
355.5000 mg | Freq: Once | INTRAVENOUS | Status: AC
  Administered 2020-03-23: 360 mg via INTRAVENOUS
  Filled 2020-03-23: qty 36

## 2020-03-23 MED ORDER — FAMOTIDINE IN NACL 20-0.9 MG/50ML-% IV SOLN
INTRAVENOUS | Status: AC
  Filled 2020-03-23: qty 50

## 2020-03-23 MED ORDER — SODIUM CHLORIDE 0.9 % IV SOLN
131.2500 mg/m2 | Freq: Once | INTRAVENOUS | Status: AC
  Administered 2020-03-23: 204 mg via INTRAVENOUS
  Filled 2020-03-23: qty 34

## 2020-03-23 MED ORDER — FAMOTIDINE IN NACL 20-0.9 MG/50ML-% IV SOLN
20.0000 mg | Freq: Once | INTRAVENOUS | Status: AC
  Administered 2020-03-23: 20 mg via INTRAVENOUS

## 2020-03-23 MED ORDER — DIPHENHYDRAMINE HCL 50 MG/ML IJ SOLN
INTRAMUSCULAR | Status: AC
  Filled 2020-03-23: qty 1

## 2020-03-23 MED ORDER — PALONOSETRON HCL INJECTION 0.25 MG/5ML
INTRAVENOUS | Status: AC
  Filled 2020-03-23: qty 5

## 2020-03-23 MED ORDER — SODIUM CHLORIDE 0.9 % IV SOLN
10.0000 mg | Freq: Once | INTRAVENOUS | Status: AC
  Administered 2020-03-23: 10 mg via INTRAVENOUS
  Filled 2020-03-23: qty 10

## 2020-03-23 MED ORDER — SODIUM CHLORIDE 0.9 % IV SOLN
Freq: Once | INTRAVENOUS | Status: AC
  Filled 2020-03-23: qty 250

## 2020-03-23 NOTE — Assessment & Plan Note (Signed)
She is having some peripheral neuropathy I plan to continue on reduced dose of paclitaxel

## 2020-03-23 NOTE — Assessment & Plan Note (Signed)
She has no bleeding complication while on Xarelto

## 2020-03-23 NOTE — Patient Instructions (Signed)
[facility] Discharge Instructions for Patients Receiving Chemotherapy  Today you received the following chemotherapy agents Taxol, Carboplatin  To help prevent nausea and vomiting after your treatment, we encourage you to take your nausea medication.DO NOT TAKE ZOFRAN FOR THREE DAYS AFTER TREATMENT.   If you develop nausea and vomiting that is not controlled by your nausea medication, call the clinic.   BELOW ARE SYMPTOMS THAT SHOULD BE REPORTED IMMEDIATELY:  *FEVER GREATER THAN 100.5 F  *CHILLS WITH OR WITHOUT FEVER  NAUSEA AND VOMITING THAT IS NOT CONTROLLED WITH YOUR NAUSEA MEDICATION  *UNUSUAL SHORTNESS OF BREATH  *UNUSUAL BRUISING OR BLEEDING  TENDERNESS IN MOUTH AND THROAT WITH OR WITHOUT PRESENCE OF ULCERS  *URINARY PROBLEMS  *BOWEL PROBLEMS  UNUSUAL RASH Items with * indicate a potential emergency and should be followed up as soon as possible.  Feel free to call the clinic should you have any questions or concerns. The clinic phone number is [phone].  Please show the [facility] at check-in to the Emergency Department and triage nurse.

## 2020-03-23 NOTE — Assessment & Plan Note (Signed)
I adjusted the dose of her chemotherapy recently She tolerated that better We will continue reduced dose treatment She does not need transfusion support

## 2020-03-23 NOTE — Assessment & Plan Note (Signed)
Her recent imaging study showed significant positive response to therapy Overall, she tolerated treatment well except for some mild pancytopenia I plan to repeat CT imaging after cycle 6 of therapy

## 2020-03-23 NOTE — Progress Notes (Signed)
[facility] OFFICE PROGRESS NOTE  Patient Care Team: [name], MD as PCP - General (Family Medicine)  ASSESSMENT & PLAN:  Endometrial cancer [facility]) Her recent imaging study showed significant positive response to therapy Overall, she tolerated treatment well except for some mild pancytopenia I plan to repeat CT imaging after cycle 6 of therapy  Pancytopenia, acquired ([place]) I adjusted the dose of her chemotherapy recently She tolerated that better We will continue reduced dose treatment She does not need transfusion support  Acute deep vein thrombosis (DVT) of distal vein of left lower extremity ([place]) She has no bleeding complication while on Xarelto  Peripheral neuropathy due to chemotherapy [facility]) She is having some peripheral neuropathy I plan to continue on reduced dose of paclitaxel   No orders of the defined types were placed in this encounter.   All questions were answered. The patient knows to call the clinic with any problems, questions or concerns. The total time spent in the appointment was 20 minutes encounter with patients including review of chart and various tests results, discussions about plan of care and coordination of care plan   [name], MD 03/23/2020 11:35 AM  INTERVAL HISTORY: Please see below for problem oriented charting. She returns for further follow-up She denies side effects from recent treatment so far No worsening neuropathy Denies bleeding complication from anticoagulation therapy Denies recent nausea  SUMMARY OF ONCOLOGIC HISTORY: Oncology History Overview Note  ER/PR negative, MSI stable Genetic testing reported out on January 07, 2020 through the Common hereditary cancer panel found no pathogenic mutations   Breast cancer, right breast ([place])  03/13/2014 Initial Diagnosis   Breast cancer, right breast ([place])   01/07/2020 Genetic Testing   Negative genetic testing.  RAD50 c.1556C>T VUS identified.  The Common  Hereditary Gene Panel offered by [facility] includes sequencing and/or deletion duplication testing of the following 48 genes: APC, ATM, AXIN2, BARD1, BMPR1A, BRCA1, BRCA2, BRIP1, CDH1, CDK4, CDKN2A (p14ARF), CDKN2A (p16INK4a), CHEK2, CTNNA1, DICER1, EPCAM (Deletion/duplication testing only), GREM1 (promoter region deletion/duplication testing only), KIT, MEN1, MLH1, MSH2, MSH3, MSH6, MUTYH, NBN, NF1, NHTL1, PALB2, PDGFRA, PMS2, POLD1, POLE, PTEN, RAD50, RAD51C, RAD51D, RNF43, SDHB, SDHC, SDHD, SMAD4, SMARCA4. STK11, TP53, TSC1, TSC2, and VHL.  The following genes were evaluated for sequence changes only: SDHA and HOXB13 c.251G>A variant only. The report date is January 07, 2020.   Endometrial cancer ([place])  01/08/2019 Imaging   Hypermetabolic activity within central uterus, consistent with known endometrial carcinoma.   No evidence of local or distant metastatic disease.     01/16/2019 Pathology Results   1. Lymph node, sentinel, biopsy, left obturator - NO CARCINOMA IDENTIFIED IN ONE LYMPH NODE (0/1) - SEE COMMENT 2. Lymph node, sentinel, biopsy, right obturator - NO CARCINOMA IDENTIFIED IN ONE LYMPH NODE (0/1) - SEE COMMENT 3. Uterus +/- tubes/ovaries, neoplastic, cervix, bilateral fallopian tubes and ovaries UTERUS: - ENDOMETRIOID CARCINOMA, FIGO GRADE 3, CONFINED TO THE UTERUS - SEE ONCOLOGY TABLE BELOW CERVIX: - NO CARCINOMA IDENTIFIED BILATERAL OVARIES: - NO CARCINOMA IDENTIFIED BILATERAL FALLOPIAN TUBES: - BENIGN PARATUBAL CYSTS (RIGHT) - NO CARCINOMA IDENTIFIED Microscopic Comment 1. and 2. Cytokeratin AE1/3 was performed on the sentinel lymph nodes to exclude micrometastasis. There is no evidence of metastatic carcinoma by immunohistochemistry. 3. UTERUS, CARCINOMA OR CARCINOSARCOMA Procedure: Hysterectomy and bilateral salpingo-oophorectomy Histologic type: Endometrioid carcinoma Histologic Grade: FIGO Grade 3 Myometrial invasion: Depth of invasion: 4 mm 1 ofMyometrial  thickness: 27 mm Uterine Serosa Involvement: Not identified Cervical stromal involvement: Not identified  Extent of involvement of other organs: N/A Lymphovascular invasion: Not identified Regional Lymph Nodes: Examined: 2 Sentinel 0 Non-sentinel 2 Total Lymph nodes with metastasis: N/A Isolated tumor cells (< 0.2 mm): 0 Micrometastasis: (> 0.2 mm and < 2.0 mm): 0 Macrometastasis: (> 2.0 mm): 0 Extracapsular extension: N/A Tumor block for ancillary studies: 3C MMR / MSI testing: Pending Pathologic Stage Classification (pTNM, AJCC 8th edition): pT1a, pN0 FIGO Stage: FIGO Ia   01/16/2019 Surgery   Surgeon: Donaciano Eva  Pre-operative Diagnosis: endometrial cancer grade 3    Operation: Robotic-assisted laparoscopic total hysterectomy with bilateral salpingoophorectomy, SLN biopsy  Operative Findings:  : 9cm uterus (mildly broadened and bulky), normal appearing fallopian tubes and ovaries.       12/15/2019 Imaging   1. Status post hysterectomy. Soft tissue nodule in the right hemipelvis measuring 3.4 x 2.6 cm. Soft tissue nodule or left pelvic sidewall/iliac lymph node conglomerate measuring 2.8 x 2.1 cm. Findings are consistent with recurrent/metastatic disease. 2. Moderate volume ascites throughout the abdomen and pelvis with fine peritoneal nodularity noted in the pelvis and new omental or ventral peritoneal soft tissue thickening, consistent with peritoneal metastatic disease. 3. Moderate right hydronephrosis and hydroureter, the distal right ureter obstructed by right pelvic soft tissue mass noted above. 4. Aortic Atherosclerosis (ICD10-I70.0).   12/22/2019 Cancer Staging   Staging form: Corpus Uteri - Carcinoma and Carcinosarcoma, AJCC 8th Edition - Clinical stage from 12/22/2019: FIGO Stage IVB (rcT1a, cN0, cM1) - Signed by [name], MD on 12/22/2019   12/24/2019 Imaging   CT chest 1. No evidence of metastatic disease in the chest. 2. Stable 3 mm pulmonary nodule of the  peripheral right lower lobe, almost certainly incidental and benign. Attention on follow-up.  3. Scarring of the left lung base. 4. Aortic Atherosclerosis (ICD10-I70.0).   12/29/2019 Procedure   Preoperative diagnosis:  1. Right ureteral obstruction 2. Metastatic endometrial cancer   Postoperative diagnosis:  1. Right ureteral obstruction 2. Metastatic endometrial cancer   Procedure:   1. Cystoscopy 2. Right ureteral stent placement (6 x 24-no string)  3. Right retrograde pyelography with interpretation   Surgeon: Pryor Curia. M.D.    Intraoperative findings: Right retrograde pyelography was performed using a 6 French ureteral catheter and Omnipaque contrast.  This demonstrated severe deviation of the distal ureter laterally around the patient's pelvic mass.  The distal ureter was clearly compressed extrinsically without intrinsic filling defects.  The ureter was then significantly dilated above the pelvis with some tortuosity extending into a dilated renal collecting system.   12/31/2019 -  Chemotherapy   The patient had carboplatin and taxol for chemotherapy treatment.     01/08/2020 Genetic Testing   Genetic testing reported out on January 07, 2020 through the Common hereditary cancer panel found no pathogenic mutations   03/01/2020 Imaging   1. Overall significant improvement, with reduced size of the pelvic adenopathy, reduced conspicuity of prior peritoneal tumor deposits, and resolution of the prior ascites. 2. Right double-J ureteral stent in place with resolution of the prior right hydronephrosis and right hydroureter. 3. Heterogeneous density in the left external iliac and common femoral vein could be from mixing of opacified and unopacified venous blood versus DVT. Consider left lower extremity Doppler venous ultrasound for further workup. 4. Other imaging findings of potential clinical significance: Mild cardiomegaly. Pancreas divisum. Lumbar spondylosis and  degenerative disc disease causing multilevel impingement. Subglandular nodularity in the right breast for example on image 6 of series 2 was not hypermetabolic  on prior PET-CT of 01/08/2019. 5. Emphysema and aortic atherosclerosis.       REVIEW OF SYSTEMS:   Constitutional: Denies fevers, chills or abnormal weight loss Eyes: Denies blurriness of vision Ears, nose, mouth, throat, and face: Denies mucositis or sore throat Respiratory: Denies cough, dyspnea or wheezes Cardiovascular: Denies palpitation, chest discomfort or lower extremity swelling Gastrointestinal:  Denies nausea, heartburn or change in bowel habits Skin: Denies abnormal skin rashes Lymphatics: Denies new lymphadenopathy or easy bruising Behavioral/Psych: Mood is stable, no new changes  All other systems were reviewed with the patient and are negative.  I have reviewed the past medical history, past surgical history, social history and family history with the patient and they are unchanged from previous note.  ALLERGIES:  is allergic to gadolinium and other.  MEDICATIONS:  Current Outpatient Medications  Medication Sig Dispense Refill  . acetaminophen (TYLENOL) 500 MG tablet Take 500 mg by mouth every 6 (six) hours as needed for moderate pain.     [name] alendronate (FOSAMAX) 70 MG tablet Take 70 mg by mouth once a week.     . Calcium Carb-Cholecalciferol (CALCIUM 600+D3) 600-200 MG-UNIT TABS Take 600 mg by mouth daily.     . Capsaicin (ARTHRITIS PAIN RELIEF EX) Apply 1 application topically daily as needed (Arthritis pain).    [name] dexamethasone (DECADRON) 4 MG tablet Take 2 tabs at the night before and 2 tab the morning of chemotherapy, every 3 weeks, by mouth x 6 cycles 36 tablet 6  . Docusate Sodium (STOOL SOFTENER) 100 MG capsule Take 100 mg by mouth daily as needed for constipation.    . fluticasone (FLONASE) 50 MCG/ACT nasal spray Place 1 spray into both nostrils daily as needed for allergies or rhinitis.     .  hydrocortisone cream 0.5 % Apply 1 application topically daily as needed for itching.    . levothyroxine (SYNTHROID) 88 MCG tablet Take 88 mcg by mouth every morning.    . loratadine (CLARITIN) 10 MG tablet Take 10 mg by mouth daily.    . Multiple Vitamin (MULTIVITAMIN) tablet Take 1 tablet by mouth daily.    . naphazoline-pheniramine (ALLERGY EYE) 0.025-0.3 % ophthalmic solution Place 1 drop into both eyes daily as needed for allergies.    [name] ondansetron (ZOFRAN) 8 MG tablet Take 1 tablet (8 mg total) by mouth every 8 (eight) hours as needed. Start on the third day after chemotherapy. 30 tablet 1  . oxyCODONE (OXY IR/ROXICODONE) 5 MG immediate release tablet Take 1 tablet (5 mg total) by mouth every 4 (four) hours as needed for severe pain. 50 tablet 0  . prochlorperazine (COMPAZINE) 10 MG tablet Take 1 tablet (10 mg total) by mouth every 6 (six) hours as needed (Nausea or vomiting). 30 tablet 1  . rivaroxaban (XARELTO) 20 MG TABS tablet Take 1 tablet (20 mg total) by mouth daily with supper. 30 tablet 1  . senna-docusate (SENOKOT-S) 8.6-50 MG tablet Take 2 tablets by mouth at bedtime. For AFTER surgery, do not take if having diarrhea (Patient taking differently: Take 2 tablets by mouth at bedtime as needed for mild constipation or moderate constipation. ) 30 tablet 0   No current facility-administered medications for this visit.   Facility-Administered Medications Ordered in Other Visits  Medication Dose Route Frequency Provider Last Rate Last Admin  . CARBOplatin (PARAPLATIN) 360 mg in sodium chloride 0.9 % 100 mL chemo infusion  360 mg Intravenous Once Alvy Bimler, Ni, MD      . dexamethasone (DECADRON)  10 mg in sodium chloride 0.9 % 50 mL IVPB  10 mg Intravenous Once Alvy Bimler, Ni, MD      . diphenhydrAMINE (BENADRYL) injection 12.5 mg  12.5 mg Intravenous Once Alvy Bimler, Ni, MD      . famotidine (PEPCID) IVPB 20 mg premix  20 mg Intravenous Once Alvy Bimler, Ni, MD      . fosaprepitant (EMEND) 150 mg in  sodium chloride 0.9 % 145 mL IVPB  150 mg Intravenous Once Alvy Bimler, Ni, MD      . PACLitaxel (TAXOL) 204 mg in sodium chloride 0.9 % 250 mL chemo infusion (> 86m/m2)  131.25 mg/m2 (Treatment Plan Recorded) Intravenous Once GAlvy Bimler Ni, MD      . palonosetron (ALOXI) injection 0.25 mg  0.25 mg Intravenous Once GAlvy Bimler Ni, MD        PHYSICAL EXAMINATION: ECOG PERFORMANCE STATUS: 1 - Symptomatic but completely ambulatory  Vitals:   03/23/20 1013  BP: (!) 151/62  Pulse: 60  Resp: 18  Temp: (!) 96 F (35.6 C)  SpO2: 100%   Filed Weights   03/23/20 1013  Weight: 126 lb 6.4 oz (57.3 kg)    GENERAL:alert, no distress and comfortable SKIN: skin color, texture, turgor are normal, no rashes or significant lesions EYES: normal, Conjunctiva are pink and non-injected, sclera clear OROPHARYNX:no exudate, no erythema and lips, buccal mucosa, and tongue normal  NECK: supple, thyroid normal size, non-tender, without nodularity LYMPH:  no palpable lymphadenopathy in the cervical, axillary or inguinal LUNGS: clear to auscultation and percussion with normal breathing effort HEART: regular rate & rhythm and no murmurs with mild bilateral lower extremity edema ABDOMEN:abdomen soft, non-tender and normal bowel sounds Musculoskeletal:no cyanosis of digits and no clubbing  NEURO: alert & oriented x 3 with fluent speech, no focal motor/sensory deficits  LABORATORY DATA:  I have reviewed the data as listed    Component Value Date/Time   NA 140 03/23/2020 1004   NA 142 12/08/2014 1153   K 3.6 03/23/2020 1004   K 3.7 12/08/2014 1153   CL 104 03/23/2020 1004   CO2 30 03/23/2020 1004   CO2 29 12/08/2014 1153   GLUCOSE 84 03/23/2020 1004   GLUCOSE 84 12/08/2014 1153   BUN 11 03/23/2020 1004   BUN 8.6 12/08/2014 1153   CREATININE 0.72 03/23/2020 1004   CREATININE 0.7 12/08/2014 1153   CALCIUM 9.6 03/23/2020 1004   CALCIUM 9.3 12/08/2014 1153   PROT 8.0 03/23/2020 1004   PROT 7.2 12/08/2014 1153    ALBUMIN 3.7 03/23/2020 1004   ALBUMIN 3.8 12/08/2014 1153   AST 18 03/23/2020 1004   AST 21 12/08/2014 1153   ALT 8 03/23/2020 1004   ALT 9 12/08/2014 1153   ALKPHOS 63 03/23/2020 1004   ALKPHOS 63 12/08/2014 1153   BILITOT 0.4 03/23/2020 1004   BILITOT 0.56 12/08/2014 1153   GFRNONAA >60 03/23/2020 1004   GFRAA >60 02/10/2020 0855    No results found for: SPEP, UPEP  Lab Results  Component Value Date   WBC 2.8 (L) 03/23/2020   NEUTROABS 1.6 (L) 03/23/2020   HGB 9.3 (L) 03/23/2020   HCT 29.2 (L) 03/23/2020   MCV 109.8 (H) 03/23/2020   PLT 180 03/23/2020      Chemistry      Component Value Date/Time   NA 140 03/23/2020 1004   NA 142 12/08/2014 1153   K 3.6 03/23/2020 1004   K 3.7 12/08/2014 1153   CL 104 03/23/2020 1004   CO2 30 03/23/2020 1004  CO2 29 12/08/2014 1153   BUN 11 03/23/2020 1004   BUN 8.6 12/08/2014 1153   CREATININE 0.72 03/23/2020 1004   CREATININE 0.7 12/08/2014 1153      Component Value Date/Time   CALCIUM 9.6 03/23/2020 1004   CALCIUM 9.3 12/08/2014 1153   ALKPHOS 63 03/23/2020 1004   ALKPHOS 63 12/08/2014 1153   AST 18 03/23/2020 1004   AST 21 12/08/2014 1153   ALT 8 03/23/2020 1004   ALT 9 12/08/2014 1153   BILITOT 0.4 03/23/2020 1004   BILITOT 0.56 12/08/2014 1153

## 2020-03-29 ENCOUNTER — Telehealth: Payer: Self-pay

## 2020-03-29 NOTE — Telephone Encounter (Signed)
She called and left a message to call her.  Called back. She is asking if it is okay to get to Covid booster, her last vaccine was in February. She go the moderna vaccine. Told her per Dr. Alvy Bimler that she can get the booster. She verbalized understanding.

## 2020-04-05 ENCOUNTER — Other Ambulatory Visit: Payer: Self-pay | Admitting: Urology

## 2020-04-12 ENCOUNTER — Ambulatory Visit: Payer: Self-pay | Admitting: Radiation Oncology

## 2020-04-12 ENCOUNTER — Inpatient Hospital Stay: Admission: RE | Admit: 2020-04-12 | Payer: Self-pay | Source: Ambulatory Visit | Admitting: Radiation Oncology

## 2020-04-13 ENCOUNTER — Other Ambulatory Visit: Payer: Self-pay | Admitting: Hematology and Oncology

## 2020-04-13 ENCOUNTER — Inpatient Hospital Stay: Payer: Medicare Other

## 2020-04-13 ENCOUNTER — Other Ambulatory Visit: Payer: Self-pay

## 2020-04-13 ENCOUNTER — Inpatient Hospital Stay (HOSPITAL_BASED_OUTPATIENT_CLINIC_OR_DEPARTMENT_OTHER): Payer: Medicare Other | Admitting: Hematology and Oncology

## 2020-04-13 ENCOUNTER — Encounter: Payer: Self-pay | Admitting: Hematology and Oncology

## 2020-04-13 DIAGNOSIS — C541 Malignant neoplasm of endometrium: Secondary | ICD-10-CM | POA: Diagnosis not present

## 2020-04-13 DIAGNOSIS — Z7189 Other specified counseling: Secondary | ICD-10-CM

## 2020-04-13 DIAGNOSIS — I824Z2 Acute embolism and thrombosis of unspecified deep veins of left distal lower extremity: Secondary | ICD-10-CM

## 2020-04-13 DIAGNOSIS — G893 Neoplasm related pain (acute) (chronic): Secondary | ICD-10-CM | POA: Diagnosis not present

## 2020-04-13 DIAGNOSIS — D61818 Other pancytopenia: Secondary | ICD-10-CM

## 2020-04-13 DIAGNOSIS — M7989 Other specified soft tissue disorders: Secondary | ICD-10-CM

## 2020-04-13 LAB — CMP (CANCER CENTER ONLY)
ALT: 7 U/L (ref 0–44)
AST: 19 U/L (ref 15–41)
Albumin: 3.6 g/dL (ref 3.5–5.0)
Alkaline Phosphatase: 66 U/L (ref 38–126)
Anion gap: 12 (ref 5–15)
BUN: 18 mg/dL (ref 8–23)
CO2: 24 mmol/L (ref 22–32)
Calcium: 9.6 mg/dL (ref 8.9–10.3)
Chloride: 105 mmol/L (ref 98–111)
Creatinine: 0.78 mg/dL (ref 0.44–1.00)
GFR, Estimated: >60 mL/min (ref >60)
Glucose, Bld: 117 mg/dL — ABNORMAL HIGH (ref 70–99)
Potassium: 3.9 mmol/L (ref 3.5–5.1)
Sodium: 141 mmol/L (ref 135–145)
Total Bilirubin: 0.3 mg/dL (ref 0.3–1.2)
Total Protein: 7.9 g/dL (ref 6.5–8.1)

## 2020-04-13 LAB — CBC WITH DIFFERENTIAL (CANCER CENTER ONLY)
Abs Immature Granulocytes: 0.01 10*3/uL (ref 0.00–0.07)
Basophils Absolute: 0 10*3/uL (ref 0.0–0.1)
Basophils Relative: 0 %
Eosinophils Absolute: 0 10*3/uL (ref 0.0–0.5)
Eosinophils Relative: 0 %
HCT: 27.7 % — ABNORMAL LOW (ref 36.0–46.0)
Hemoglobin: 8.9 g/dL — ABNORMAL LOW (ref 12.0–15.0)
Immature Granulocytes: 0 %
Lymphocytes Relative: 18 %
Lymphs Abs: 0.4 10*3/uL — ABNORMAL LOW (ref 0.7–4.0)
MCH: 35.5 pg — ABNORMAL HIGH (ref 26.0–34.0)
MCHC: 32.1 g/dL (ref 30.0–36.0)
MCV: 110.4 fL — ABNORMAL HIGH (ref 80.0–100.0)
Monocytes Absolute: 0.1 10*3/uL (ref 0.1–1.0)
Monocytes Relative: 2 %
Neutro Abs: 1.8 10*3/uL (ref 1.7–7.7)
Neutrophils Relative %: 80 %
Platelet Count: 171 10*3/uL (ref 150–400)
RBC: 2.51 MIL/uL — ABNORMAL LOW (ref 3.87–5.11)
RDW: 13.5 % (ref 11.5–15.5)
WBC Count: 2.3 10*3/uL — ABNORMAL LOW (ref 4.0–10.5)
nRBC: 0 % (ref 0.0–0.2)

## 2020-04-13 MED ORDER — PALONOSETRON HCL INJECTION 0.25 MG/5ML
0.2500 mg | Freq: Once | INTRAVENOUS | Status: AC
  Administered 2020-04-13: 0.25 mg via INTRAVENOUS

## 2020-04-13 MED ORDER — DIPHENHYDRAMINE HCL 50 MG/ML IJ SOLN
12.5000 mg | Freq: Once | INTRAMUSCULAR | Status: AC
  Administered 2020-04-13: 12.5 mg via INTRAVENOUS

## 2020-04-13 MED ORDER — FAMOTIDINE IN NACL 20-0.9 MG/50ML-% IV SOLN
20.0000 mg | Freq: Once | INTRAVENOUS | Status: AC
  Administered 2020-04-13: 20 mg via INTRAVENOUS

## 2020-04-13 MED ORDER — FAMOTIDINE IN NACL 20-0.9 MG/50ML-% IV SOLN
INTRAVENOUS | Status: AC
  Filled 2020-04-13: qty 50

## 2020-04-13 MED ORDER — SODIUM CHLORIDE 0.9 % IV SOLN
150.0000 mg | Freq: Once | INTRAVENOUS | Status: AC
  Administered 2020-04-13: 150 mg via INTRAVENOUS
  Filled 2020-04-13: qty 150

## 2020-04-13 MED ORDER — SODIUM CHLORIDE 0.9 % IV SOLN
Freq: Once | INTRAVENOUS | Status: AC
  Filled 2020-04-13: qty 250

## 2020-04-13 MED ORDER — SODIUM CHLORIDE 0.9 % IV SOLN
355.5000 mg | Freq: Once | INTRAVENOUS | Status: AC
  Administered 2020-04-13: 360 mg via INTRAVENOUS
  Filled 2020-04-13: qty 36

## 2020-04-13 MED ORDER — DIPHENHYDRAMINE HCL 50 MG/ML IJ SOLN
INTRAMUSCULAR | Status: AC
  Filled 2020-04-13: qty 1

## 2020-04-13 MED ORDER — SODIUM CHLORIDE 0.9 % IV SOLN
10.0000 mg | Freq: Once | INTRAVENOUS | Status: AC
  Administered 2020-04-13: 10 mg via INTRAVENOUS
  Filled 2020-04-13: qty 10

## 2020-04-13 MED ORDER — PALONOSETRON HCL INJECTION 0.25 MG/5ML
INTRAVENOUS | Status: AC
  Filled 2020-04-13: qty 5

## 2020-04-13 MED ORDER — SODIUM CHLORIDE 0.9 % IV SOLN
131.2500 mg/m2 | Freq: Once | INTRAVENOUS | Status: AC
  Administered 2020-04-13: 204 mg via INTRAVENOUS
  Filled 2020-04-13: qty 34

## 2020-04-13 NOTE — Assessment & Plan Note (Signed)
I adjusted the dose of her chemotherapy recently She tolerated that better We will continue reduced dose treatment She does not need transfusion support

## 2020-04-13 NOTE — Patient Instructions (Signed)
   [facility] Discharge Instructions for Patients Receiving Chemotherapy  Today you received the following chemotherapy agents Taxol and Carboplatin   To help prevent nausea and vomiting after your treatment, we encourage you to take your nausea medication as directed.    If you develop nausea and vomiting that is not controlled by your nausea medication, call the clinic.   BELOW ARE SYMPTOMS THAT SHOULD BE REPORTED IMMEDIATELY:  *FEVER GREATER THAN 100.5 F  *CHILLS WITH OR WITHOUT FEVER  NAUSEA AND VOMITING THAT IS NOT CONTROLLED WITH YOUR NAUSEA MEDICATION  *UNUSUAL SHORTNESS OF BREATH  *UNUSUAL BRUISING OR BLEEDING  TENDERNESS IN MOUTH AND THROAT WITH OR WITHOUT PRESENCE OF ULCERS  *URINARY PROBLEMS  *BOWEL PROBLEMS  UNUSUAL RASH Items with * indicate a potential emergency and should be followed up as soon as possible.  Feel free to call the clinic should you have any questions or concerns. The clinic phone number is [phone].  Please show the CHEMO ALERT CARD at check-in to the Emergency Department and triage nurse.   

## 2020-04-13 NOTE — Assessment & Plan Note (Signed)
She has no bleeding complication while on Xarelto

## 2020-04-13 NOTE — Assessment & Plan Note (Signed)
She has minimum pain ?She has pain medicine to take as needed ?

## 2020-04-13 NOTE — Assessment & Plan Note (Signed)
Her recent imaging study showed significant positive response to therapy Overall, she tolerated treatment well except for some mild pancytopenia I plan to repeat CT imaging after cycle 6 of therapy

## 2020-04-13 NOTE — Progress Notes (Signed)
[place] OFFICE PROGRESS NOTE  Patient Care Team: [name], MD as PCP - General (Family Medicine)  ASSESSMENT & PLAN:  Endometrial cancer [facility]) Her recent imaging study showed significant positive response to therapy Overall, she tolerated treatment well except for some mild pancytopenia I plan to repeat CT imaging after cycle 6 of therapy  Pancytopenia, acquired ([place]) I adjusted the dose of her chemotherapy recently She tolerated that better We will continue reduced dose treatment She does not need transfusion support  Acute deep vein thrombosis (DVT) of distal vein of left lower extremity ([place]) She has no bleeding complication while on Xarelto  Cancer associated pain She has minimum pain She has pain medicine to take as needed   No orders of the defined types were placed in this encounter.   All questions were answered. The patient knows to call the clinic with any problems, questions or concerns. The total time spent in the appointment was 20 minutes encounter with patients including review of chart and various tests results, discussions about plan of care and coordination of care plan   [name], MD 04/13/2020 12:02 PM  INTERVAL HISTORY: Please see below for problem oriented charting. She returns for further chemotherapy She denies bleeding complication from anticoagulation therapy No worsening peripheral neuropathy She has occasional rare abdominal pain that comes and goes She only take acetaminophen No recent changes in bowel habits  SUMMARY OF ONCOLOGIC HISTORY: Oncology History Overview Note  ER/PR negative, MSI stable Genetic testing reported out on January 07, 2020 through the Common hereditary cancer panel found no pathogenic mutations   Breast cancer, right breast ([place])  03/13/2014 Initial Diagnosis   Breast cancer, right breast ([place])   01/07/2020 Genetic Testing   Negative genetic testing.  RAD50 c.1556C>T VUS identified.  The  Common Hereditary Gene Panel offered by [facility] includes sequencing and/or deletion duplication testing of the following 48 genes: APC, ATM, AXIN2, BARD1, BMPR1A, BRCA1, BRCA2, BRIP1, CDH1, CDK4, CDKN2A (p14ARF), CDKN2A (p16INK4a), CHEK2, CTNNA1, DICER1, EPCAM (Deletion/duplication testing only), GREM1 (promoter region deletion/duplication testing only), KIT, MEN1, MLH1, MSH2, MSH3, MSH6, MUTYH, NBN, NF1, NHTL1, PALB2, PDGFRA, PMS2, POLD1, POLE, PTEN, RAD50, RAD51C, RAD51D, RNF43, SDHB, SDHC, SDHD, SMAD4, SMARCA4. STK11, TP53, TSC1, TSC2, and VHL.  The following genes were evaluated for sequence changes only: SDHA and HOXB13 c.251G>A variant only. The report date is January 07, 2020.   Endometrial cancer ([place])  01/08/2019 Imaging   Hypermetabolic activity within central uterus, consistent with known endometrial carcinoma.   No evidence of local or distant metastatic disease.     01/16/2019 Pathology Results   1. Lymph node, sentinel, biopsy, left obturator - NO CARCINOMA IDENTIFIED IN ONE LYMPH NODE (0/1) - SEE COMMENT 2. Lymph node, sentinel, biopsy, right obturator - NO CARCINOMA IDENTIFIED IN ONE LYMPH NODE (0/1) - SEE COMMENT 3. Uterus +/- tubes/ovaries, neoplastic, cervix, bilateral fallopian tubes and ovaries UTERUS: - ENDOMETRIOID CARCINOMA, FIGO GRADE 3, CONFINED TO THE UTERUS - SEE ONCOLOGY TABLE BELOW CERVIX: - NO CARCINOMA IDENTIFIED BILATERAL OVARIES: - NO CARCINOMA IDENTIFIED BILATERAL FALLOPIAN TUBES: - BENIGN PARATUBAL CYSTS (RIGHT) - NO CARCINOMA IDENTIFIED Microscopic Comment 1. and 2. Cytokeratin AE1/3 was performed on the sentinel lymph nodes to exclude micrometastasis. There is no evidence of metastatic carcinoma by immunohistochemistry. 3. UTERUS, CARCINOMA OR CARCINOSARCOMA Procedure: Hysterectomy and bilateral salpingo-oophorectomy Histologic type: Endometrioid carcinoma Histologic Grade: FIGO Grade 3 Myometrial invasion: Depth of invasion: 4 mm 1 ofMyometrial  thickness: 27 mm Uterine Serosa Involvement: Not identified Cervical  stromal involvement: Not identified Extent of involvement of other organs: N/A Lymphovascular invasion: Not identified Regional Lymph Nodes: Examined: 2 Sentinel 0 Non-sentinel 2 Total Lymph nodes with metastasis: N/A Isolated tumor cells (< 0.2 mm): 0 Micrometastasis: (> 0.2 mm and < 2.0 mm): 0 Macrometastasis: (> 2.0 mm): 0 Extracapsular extension: N/A Tumor block for ancillary studies: 3C MMR / MSI testing: Pending Pathologic Stage Classification (pTNM, AJCC 8th edition): pT1a, pN0 FIGO Stage: FIGO Ia   01/16/2019 Surgery   Surgeon: Donaciano Eva  Pre-operative Diagnosis: endometrial cancer grade 3    Operation: Robotic-assisted laparoscopic total hysterectomy with bilateral salpingoophorectomy, SLN biopsy  Operative Findings:  : 9cm uterus (mildly broadened and bulky), normal appearing fallopian tubes and ovaries.       12/15/2019 Imaging   1. Status post hysterectomy. Soft tissue nodule in the right hemipelvis measuring 3.4 x 2.6 cm. Soft tissue nodule or left pelvic sidewall/iliac lymph node conglomerate measuring 2.8 x 2.1 cm. Findings are consistent with recurrent/metastatic disease. 2. Moderate volume ascites throughout the abdomen and pelvis with fine peritoneal nodularity noted in the pelvis and new omental or ventral peritoneal soft tissue thickening, consistent with peritoneal metastatic disease. 3. Moderate right hydronephrosis and hydroureter, the distal right ureter obstructed by right pelvic soft tissue mass noted above. 4. Aortic Atherosclerosis (ICD10-I70.0).   12/22/2019 Cancer Staging   Staging form: Corpus Uteri - Carcinoma and Carcinosarcoma, AJCC 8th Edition - Clinical stage from 12/22/2019: FIGO Stage IVB (rcT1a, cN0, cM1) - Signed by [name], MD on 12/22/2019   12/24/2019 Imaging   CT chest 1. No evidence of metastatic disease in the chest. 2. Stable 3 mm pulmonary nodule of the  peripheral right lower lobe, almost certainly incidental and benign. Attention on follow-up.  3. Scarring of the left lung base. 4. Aortic Atherosclerosis (ICD10-I70.0).   12/29/2019 Procedure   Preoperative diagnosis:  1. Right ureteral obstruction 2. Metastatic endometrial cancer   Postoperative diagnosis:  1. Right ureteral obstruction 2. Metastatic endometrial cancer   Procedure:   1. Cystoscopy 2. Right ureteral stent placement (6 x 24-no string)  3. Right retrograde pyelography with interpretation   Surgeon: Pryor Curia. M.D.    Intraoperative findings: Right retrograde pyelography was performed using a 6 French ureteral catheter and Omnipaque contrast.  This demonstrated severe deviation of the distal ureter laterally around the patient's pelvic mass.  The distal ureter was clearly compressed extrinsically without intrinsic filling defects.  The ureter was then significantly dilated above the pelvis with some tortuosity extending into a dilated renal collecting system.   12/31/2019 -  Chemotherapy   The patient had carboplatin and taxol for chemotherapy treatment.     01/08/2020 Genetic Testing   Genetic testing reported out on January 07, 2020 through the Common hereditary cancer panel found no pathogenic mutations   03/01/2020 Imaging   1. Overall significant improvement, with reduced size of the pelvic adenopathy, reduced conspicuity of prior peritoneal tumor deposits, and resolution of the prior ascites. 2. Right double-J ureteral stent in place with resolution of the prior right hydronephrosis and right hydroureter. 3. Heterogeneous density in the left external iliac and common femoral vein could be from mixing of opacified and unopacified venous blood versus DVT. Consider left lower extremity Doppler venous ultrasound for further workup. 4. Other imaging findings of potential clinical significance: Mild cardiomegaly. Pancreas divisum. Lumbar spondylosis and  degenerative disc disease causing multilevel impingement. Subglandular nodularity in the right breast for example on image 6 of series  2 was not hypermetabolic on prior PET-CT of 01/08/2019. 5. Emphysema and aortic atherosclerosis.       REVIEW OF SYSTEMS:   Constitutional: Denies fevers, chills or abnormal weight loss Eyes: Denies blurriness of vision Ears, nose, mouth, throat, and face: Denies mucositis or sore throat Respiratory: Denies cough, dyspnea or wheezes Cardiovascular: Denies palpitation, chest discomfort or lower extremity swelling Gastrointestinal:  Denies nausea, heartburn or change in bowel habits Skin: Denies abnormal skin rashes Lymphatics: Denies new lymphadenopathy or easy bruising Behavioral/Psych: Mood is stable, no new changes  All other systems were reviewed with the patient and are negative.  I have reviewed the past medical history, past surgical history, social history and family history with the patient and they are unchanged from previous note.  ALLERGIES:  is allergic to gadolinium and other.  MEDICATIONS:  Current Outpatient Medications  Medication Sig Dispense Refill  . acetaminophen (TYLENOL) 500 MG tablet Take 500 mg by mouth every 6 (six) hours as needed for moderate pain.     [name] alendronate (FOSAMAX) 70 MG tablet Take 70 mg by mouth once a week.     . Calcium Carb-Cholecalciferol (CALCIUM 600+D3) 600-200 MG-UNIT TABS Take 600 mg by mouth daily.     . Capsaicin (ARTHRITIS PAIN RELIEF EX) Apply 1 application topically daily as needed (Arthritis pain).    [name] dexamethasone (DECADRON) 4 MG tablet Take 2 tabs at the night before and 2 tab the morning of chemotherapy, every 3 weeks, by mouth x 6 cycles 36 tablet 6  . Docusate Sodium (STOOL SOFTENER) 100 MG capsule Take 100 mg by mouth daily as needed for constipation.    . fluticasone (FLONASE) 50 MCG/ACT nasal spray Place 1 spray into both nostrils daily as needed for allergies or rhinitis.     .  hydrocortisone cream 0.5 % Apply 1 application topically daily as needed for itching.    . levothyroxine (SYNTHROID) 88 MCG tablet Take 88 mcg by mouth every morning.    . loratadine (CLARITIN) 10 MG tablet Take 10 mg by mouth daily.    . Multiple Vitamin (MULTIVITAMIN) tablet Take 1 tablet by mouth daily.    . naphazoline-pheniramine (ALLERGY EYE) 0.025-0.3 % ophthalmic solution Place 1 drop into both eyes daily as needed for allergies.    [name] ondansetron (ZOFRAN) 8 MG tablet Take 1 tablet (8 mg total) by mouth every 8 (eight) hours as needed. Start on the third day after chemotherapy. 30 tablet 1  . oxyCODONE (OXY IR/ROXICODONE) 5 MG immediate release tablet Take 1 tablet (5 mg total) by mouth every 4 (four) hours as needed for severe pain. 50 tablet 0  . prochlorperazine (COMPAZINE) 10 MG tablet Take 1 tablet (10 mg total) by mouth every 6 (six) hours as needed (Nausea or vomiting). 30 tablet 1  . rivaroxaban (XARELTO) 20 MG TABS tablet Take 1 tablet (20 mg total) by mouth daily with supper. 30 tablet 1  . senna-docusate (SENOKOT-S) 8.6-50 MG tablet Take 2 tablets by mouth at bedtime. For AFTER surgery, do not take if having diarrhea (Patient taking differently: Take 2 tablets by mouth at bedtime as needed for mild constipation or moderate constipation. ) 30 tablet 0   No current facility-administered medications for this visit.   Facility-Administered Medications Ordered in Other Visits  Medication Dose Route Frequency Provider Last Rate Last Admin  . CARBOplatin (PARAPLATIN) 360 mg in sodium chloride 0.9 % 250 mL chemo infusion  360 mg Intravenous Once [name], MD      .  famotidine (PEPCID) IVPB 20 mg premix  20 mg Intravenous Once Skylier Kretschmer, MD      . fosaprepitant (EMEND) 150 mg in sodium chloride 0.9 % 145 mL IVPB  150 mg Intravenous Once Alvy Bimler, Bradleigh Sonnen, MD 450 mL/hr at 04/13/20 1151 150 mg at 04/13/20 1151  . PACLitaxel (TAXOL) 204 mg in sodium chloride 0.9 % 250 mL chemo infusion (>  4m/m2)  131.25 mg/m2 (Treatment Plan Recorded) Intravenous Once GHeath Lark MD        PHYSICAL EXAMINATION: ECOG PERFORMANCE STATUS: 1 - Symptomatic but completely ambulatory  Vitals:   04/13/20 0959  BP: (!) 157/61  Pulse: 75  Resp: 18  Temp: 98 F (36.7 C)  SpO2: 97%   Filed Weights   04/13/20 0959  Weight: 129 lb 3.2 oz (58.6 kg)    GENERAL:alert, no distress and comfortable Musculoskeletal:no cyanosis of digits and no clubbing  NEURO: alert & oriented x 3 with fluent speech, no focal motor/sensory deficits  LABORATORY DATA:  I have reviewed the data as listed    Component Value Date/Time   NA 141 04/13/2020 0922   NA 142 12/08/2014 1153   K 3.9 04/13/2020 0922   K 3.7 12/08/2014 1153   CL 105 04/13/2020 0922   CO2 24 04/13/2020 0922   CO2 29 12/08/2014 1153   GLUCOSE 117 (H) 04/13/2020 0922   GLUCOSE 84 12/08/2014 1153   BUN 18 04/13/2020 0922   BUN 8.6 12/08/2014 1153   CREATININE 0.78 04/13/2020 0922   CREATININE 0.7 12/08/2014 1153   CALCIUM 9.6 04/13/2020 0922   CALCIUM 9.3 12/08/2014 1153   PROT 7.9 04/13/2020 0922   PROT 7.2 12/08/2014 1153   ALBUMIN 3.6 04/13/2020 0922   ALBUMIN 3.8 12/08/2014 1153   AST 19 04/13/2020 0922   AST 21 12/08/2014 1153   ALT 7 04/13/2020 0922   ALT 9 12/08/2014 1153   ALKPHOS 66 04/13/2020 0922   ALKPHOS 63 12/08/2014 1153   BILITOT 0.3 04/13/2020 0922   BILITOT 0.56 12/08/2014 1153   GFRNONAA >60 04/13/2020 0922   GFRAA >60 02/10/2020 0855    No results found for: SPEP, UPEP  Lab Results  Component Value Date   WBC 2.3 (L) 04/13/2020   NEUTROABS 1.8 04/13/2020   HGB 8.9 (L) 04/13/2020   HCT 27.7 (L) 04/13/2020   MCV 110.4 (H) 04/13/2020   PLT 171 04/13/2020      Chemistry      Component Value Date/Time   NA 141 04/13/2020 0922   NA 142 12/08/2014 1153   K 3.9 04/13/2020 0922   K 3.7 12/08/2014 1153   CL 105 04/13/2020 0922   CO2 24 04/13/2020 0922   CO2 29 12/08/2014 1153   BUN 18 04/13/2020  0922   BUN 8.6 12/08/2014 1153   CREATININE 0.78 04/13/2020 0922   CREATININE 0.7 12/08/2014 1153      Component Value Date/Time   CALCIUM 9.6 04/13/2020 0922   CALCIUM 9.3 12/08/2014 1153   ALKPHOS 66 04/13/2020 0922   ALKPHOS 63 12/08/2014 1153   AST 19 04/13/2020 0922   AST 21 12/08/2014 1153   ALT 7 04/13/2020 0922   ALT 9 12/08/2014 1153   BILITOT 0.3 04/13/2020 0922   BILITOT 0.56 12/08/2014 1153

## 2020-04-22 NOTE — Progress Notes (Addendum)
COVID Vaccine Completed:  No Date COVID Vaccine completed: COVID vaccine manufacturer: [employer]   PCP - [name], MD Cardiologist - N/A   Chest x-ray - Ct chest 12-25-19 EKG -  Stress Test -  ECHO -  Cardiac Cath -  Pacemaker/ICD device last checked:  Sleep Study -  CPAP -   Fasting Blood Sugar -  Checks Blood Sugar _____ times a day  Blood Thinner Instructions:  Xarelto 20 mg for DVT 12-23-19.  Pt to check with Dr. Alinda Money  Aspirin Instructions: Last Dose:  Anesthesia review:   Patient denies shortness of breath, fever, cough and chest pain at PAT appointment.  Pt able to climb a flight of stairs and perform ADL's without assistance.     Patient verbalized understanding of instructions that were given to them at the PAT appointment. Patient was also instructed that they will need to review over the PAT instructions again at home before surgery.

## 2020-04-22 NOTE — Patient Instructions (Addendum)
DUE TO COVID-19 ONLY ONE VISITOR IS ALLOWED TO COME WITH YOU AND STAY IN THE WAITING ROOM ONLY DURING PRE OP AND PROCEDURE.   IF YOU WILL BE ADMITTED INTO THE HOSPITAL YOU ARE ALLOWED ONE SUPPORT PERSON DURING VISITATION HOURS ONLY (10AM -8PM)   . The support person may change daily. . The support person must pass our screening, gel in and out, and wear a mask at all times, including in the patient's room. . Patients must also wear a mask when staff or their support person are in the room.   COVID SWAB TESTING MUST BE COMPLETED ON:  Monday, 05-03-20 @ 10:05 AM   [address] [place], [place] [address]  (Must self quarantine after testing. Follow instructions on handout.)        Your procedure is scheduled on:  Thursday, 05-06-20   Report to [facility] Main  Entrance    Report to admitting at 9:15 AM   Call this number if you have problems the morning of surgery [phone]   Do not eat food :After Midnight.   May have liquids until 8:15 AM  day of surgery  CLEAR LIQUID DIET  Foods Allowed                                                                     Foods Excluded  Water, Black Coffee and tea, regular and decaf             liquids that you cannot  Plain Jell-O in any flavor  (No red)                                    see through such as: Fruit ices (not with fruit pulp)                                      milk, soups, orange juice              Iced Popsicles (No red)                                      All solid food                                   Apple juices Sports drinks like Gatorade (No red) Lightly seasoned clear broth or consume(fat free) Sugar, honey syrup   Oral Hygiene is also important to reduce your risk of infection.                                    Remember - BRUSH YOUR TEETH THE MORNING OF SURGERY WITH YOUR REGULAR TOOTHPASTE   Do NOT smoke after Midnight   Take these medicines the morning of surgery with A SIP OF WATER:  Synthroid,  Claritin  You may not have any metal on your body including hair pins, jewelry, and body piercings             Do not wear make-up, lotions, powders, perfumes/cologne, or deodorant             Do not wear nail polish.  Do not shave  48 hours prior to surgery.              Do not bring valuables to the hospital. [facility].   Contacts, dentures or bridgework may not be worn into surgery.   Patients discharged the day of surgery will not be allowed to drive home.   Special Instructions: Bring a copy of your healthcare power of attorney and living will documents         the day of surgery if you haven't scanned them in before.              Please read over the following fact sheets you were given: IF YOU HAVE QUESTIONS ABOUT YOUR PRE OP INSTRUCTIONS PLEASE CALL [phone]   [facility] - Preparing for Surgery Before surgery, you can play an important role.  Because skin is not sterile, your skin needs to be as free of germs as possible.  You can reduce the number of germs on your skin by washing with CHG (chlorahexidine gluconate) soap before surgery.  CHG is an antiseptic cleaner which kills germs and bonds with the skin to continue killing germs even after washing. Please DO NOT use if you have an allergy to CHG or antibacterial soaps.  If your skin becomes reddened/irritated stop using the CHG and inform your nurse when you arrive at Short Stay. Do not shave (including legs and underarms) for at least 48 hours prior to the first CHG shower.  You may shave your face/neck.  Please follow these instructions carefully:  1.  Shower with CHG Soap the night before surgery and the  morning of surgery.  2.  If you choose to wash your hair, wash your hair first as usual with your normal  shampoo.  3.  After you shampoo, rinse your hair and body thoroughly to remove the shampoo.                             4.  Use CHG as you would  any other liquid soap.  You can apply chg directly to the skin and wash.  Gently with a scrungie or clean washcloth.  5.  Apply the CHG Soap to your body ONLY FROM THE NECK DOWN.   Do   not use on face/ open                           Wound or open sores. Avoid contact with eyes, ears mouth and   genitals (private parts).                       Wash face,  Genitals (private parts) with your normal soap.             6.  Wash thoroughly, paying special attention to the area where your    surgery  will be performed.  7.  Thoroughly rinse your body with warm water from the neck down.  8.  DO NOT shower/wash with your normal soap after using and  rinsing off the CHG Soap.                9.  Pat yourself dry with a clean towel.            10.  Wear clean pajamas.            11.  Place clean sheets on your bed the night of your first shower and do not  sleep with pets. Day of Surgery : Do not apply any lotions/deodorants the morning of surgery.  Please wear clean clothes to the hospital/surgery center.  FAILURE TO FOLLOW THESE INSTRUCTIONS MAY RESULT IN THE CANCELLATION OF YOUR SURGERY  PATIENT SIGNATURE_________________________________  NURSE SIGNATURE__________________________________  ________________________________________________________________________

## 2020-04-26 ENCOUNTER — Other Ambulatory Visit: Payer: Self-pay

## 2020-04-26 ENCOUNTER — Encounter (HOSPITAL_COMMUNITY)
Admission: RE | Admit: 2020-04-26 | Discharge: 2020-04-26 | Disposition: A | Discharge status: Home / Self Care | Payer: Medicare Other | Source: Ambulatory Visit | Attending: Urology | Admitting: Urology

## 2020-04-26 ENCOUNTER — Encounter (HOSPITAL_COMMUNITY): Payer: Self-pay

## 2020-04-27 ENCOUNTER — Encounter (HOSPITAL_COMMUNITY)
Admission: RE | Admit: 2020-04-27 | Discharge: 2020-04-27 | Disposition: A | Discharge status: Home / Self Care | Payer: Medicare Other | Source: Ambulatory Visit | Attending: Urology | Admitting: Urology

## 2020-04-27 DIAGNOSIS — Z01812 Encounter for preprocedural laboratory examination: Secondary | ICD-10-CM | POA: Insufficient documentation

## 2020-04-27 LAB — CBC
HCT: 28.1 % — ABNORMAL LOW (ref 36.0–46.0)
Hemoglobin: 8.9 g/dL — ABNORMAL LOW (ref 12.0–15.0)
MCH: 35.5 pg — ABNORMAL HIGH (ref 26.0–34.0)
MCHC: 31.7 g/dL (ref 30.0–36.0)
MCV: 112 fL — ABNORMAL HIGH (ref 80.0–100.0)
Platelets: 128 K/uL — ABNORMAL LOW (ref 150–400)
RBC: 2.51 MIL/uL — ABNORMAL LOW (ref 3.87–5.11)
RDW: 13.2 % (ref 11.5–15.5)
WBC: 1.5 K/uL — ABNORMAL LOW (ref 4.0–10.5)
nRBC: 0 % (ref 0.0–0.2)

## 2020-04-27 LAB — BASIC METABOLIC PANEL
Anion gap: 9 (ref 5–15)
BUN: 13 mg/dL (ref 8–23)
CO2: 28 mmol/L (ref 22–32)
Calcium: 9.5 mg/dL (ref 8.9–10.3)
Chloride: 100 mmol/L (ref 98–111)
Creatinine, Ser: 0.68 mg/dL (ref 0.44–1.00)
GFR, Estimated: >60 mL/min (ref >60)
Glucose, Bld: 91 mg/dL (ref 70–99)
Potassium: 3.9 mmol/L (ref 3.5–5.1)
Sodium: 137 mmol/L (ref 135–145)

## 2020-04-27 NOTE — Progress Notes (Signed)
CBC sent to Dr. Borden to review. 

## 2020-04-28 ENCOUNTER — Ambulatory Visit
Admission: RE | Admit: 2020-04-28 | Discharge: 2020-04-28 | Disposition: A | Discharge status: Home / Self Care | Payer: Medicare Other | Source: Ambulatory Visit | Attending: Family Medicine | Admitting: Family Medicine

## 2020-04-28 ENCOUNTER — Other Ambulatory Visit: Payer: Self-pay

## 2020-04-28 DIAGNOSIS — Z1231 Encounter for screening mammogram for malignant neoplasm of breast: Secondary | ICD-10-CM

## 2020-05-03 ENCOUNTER — Other Ambulatory Visit (HOSPITAL_COMMUNITY)
Admission: RE | Admit: 2020-05-03 | Discharge: 2020-05-03 | Disposition: A | Discharge status: Home / Self Care | Payer: Medicare Other | Source: Ambulatory Visit | Attending: Urology | Admitting: Urology

## 2020-05-03 ENCOUNTER — Other Ambulatory Visit: Payer: Self-pay | Admitting: Family Medicine

## 2020-05-03 DIAGNOSIS — Z01812 Encounter for preprocedural laboratory examination: Secondary | ICD-10-CM | POA: Diagnosis present

## 2020-05-03 DIAGNOSIS — R928 Other abnormal and inconclusive findings on diagnostic imaging of breast: Secondary | ICD-10-CM

## 2020-05-03 DIAGNOSIS — Z20822 Contact with and (suspected) exposure to covid-19: Secondary | ICD-10-CM | POA: Diagnosis not present

## 2020-05-03 LAB — SARS CORONAVIRUS 2 (TAT 6-24 HRS): SARS Coronavirus 2: NEGATIVE

## 2020-05-05 NOTE — H&P (Signed)
Right ureteral obstruction   Erica [name] follows up today for her right ureteral obstruction secondary to malignancy. She has been undergoing chemotherapy in recently underwent repeat imaging. This indicated a positive response to systemic chemotherapy with a decrease in size of her right-sided pelvic mass. However, there is still a residual mass noted right at the right UPJ. Her stent is in proper position. She has been tolerating her stent well without significant voiding complaints or flank pain.     ALLERGIES: Gadolinium Derivatives    MEDICATIONS: Acetaminophen  Calcium 600-Vit D3  Claritin  Compazine 10 mg tablet  Dexamethasone 4 mg tablet  Etodolac 400 mg tablet  Flonase Allergy Relief  Fosamax Plus D 70 mg-2,800 unit tablet  Multivitamins  Oxycodone Hcl 5 mg capsule  Senokot 8.6 mg tablet  Synthroid 88 mcg tablet  Tramadol Hcl 50 mg tablet  Zofran     GU PSH: Cystoscopy Insert Stent, Right - 12/29/2019 Hysterectomy     NON-GU PSH: Breast lumpectomy     GU PMH: Ureteral obstruction - 12/23/2019 Malignant neoplasm of uterine adnexa, unspecified      PMH Notes:   1) Right ureteral obstruction: She presented with right ureteral obstruction due to advanced endometrial cancer in August 2021. Cr was 0.74 at baseline and she was asymptomatic. She elected ureteral stent management.   Aug 2021: Right ureteral stent (6 x 24)   NON-GU PMH: Arthritis Breast Cancer, History    FAMILY HISTORY: 1 Daughter - Daughter 2 sons - Son   SOCIAL HISTORY: Marital Status: Widowed Preferred Language: English; Ethnicity: Not Hispanic Or Latino; Race: Black or African American Current Smoking Status: Patient has never smoked.   Tobacco Use Assessment Completed: Used Tobacco in last 30 days? Has never drank.  Does not drink caffeine.    REVIEW OF SYSTEMS:    GU Review Female:   Patient denies frequent urination, hard to postpone urination, burning /pain with urination, get up at  night to urinate, leakage of urine, stream starts and stops, trouble starting your stream, have to strain to urinate, and currently pregnant.  Gastrointestinal (Lower):   Patient denies diarrhea and constipation.  Gastrointestinal (Upper):   Patient denies nausea and vomiting.  Constitutional:   Patient denies fever, night sweats, weight loss, and fatigue.  Skin:   Patient denies skin rash/ lesion and itching.  Eyes:   Patient denies blurred vision and double vision.  Ears/ Nose/ Throat:   Patient denies sore throat and sinus problems.  Hematologic/Lymphatic:   Patient denies swollen glands and easy bruising.  Cardiovascular:   Patient denies leg swelling and chest pains.  Respiratory:   Patient denies cough and shortness of breath.  Endocrine:   Patient denies excessive thirst.  Musculoskeletal:   Patient denies back pain and joint pain.  Neurological:   Patient denies dizziness and headaches.  Psychologic:   Patient denies depression and anxiety.   VITAL SIGNS:     Weight 109 lb / 49.44 kg  Height 62 in / 157.48 cm  BMI 19.9 kg/m   MULTI-SYSTEM PHYSICAL EXAMINATION:    Constitutional: Well-nourished. No physical deformities. Normally developed. Good grooming.  Respiratory: No labored breathing, no use of accessory muscles. Clear bilaterally.  Cardiovascular: Normal temperature, normal extremity pulses, no swelling, no varicosities. Regular rate and rhythm.  Gastrointestinal: No mass, no tenderness, no rigidity, non obese abdomen. No CVA tenderness.     ASSESSMENT:      ICD-10 Details  1 GU:   Ureteral obstruction - N13.1  PLAN:       1. Right ureteral obstruction: After reviewing her CT scan, I do think she will have residual obstruction if we remove her stent at this time. She is planning on continuing systemic therapy and I have recommended that we plan to perform cystoscopy and right ureteral stent change in the near future. If she continues to have a positive response and  this mass continues to decrease in size, we will consider removal of her stent in a few months.

## 2020-05-06 ENCOUNTER — Encounter (HOSPITAL_COMMUNITY)
Admission: RE | Disposition: A | Discharge status: Home / Self Care | Payer: Self-pay | Source: Other Acute Inpatient Hospital | Attending: Urology

## 2020-05-06 ENCOUNTER — Inpatient Hospital Stay (HOSPITAL_COMMUNITY)
Admission: EM | Admit: 2020-05-06 | Discharge: 2020-05-11 | DRG: 748 | Disposition: A | Discharge status: Home Health | Payer: Medicare Other | Attending: Gynecologic Oncology | Admitting: Gynecologic Oncology

## 2020-05-06 ENCOUNTER — Encounter (HOSPITAL_COMMUNITY): Payer: Self-pay

## 2020-05-06 ENCOUNTER — Ambulatory Visit (HOSPITAL_COMMUNITY): Payer: Medicare Other | Admitting: Certified Registered"

## 2020-05-06 ENCOUNTER — Encounter (HOSPITAL_COMMUNITY): Payer: Self-pay | Admitting: Urology

## 2020-05-06 ENCOUNTER — Ambulatory Visit (HOSPITAL_COMMUNITY): Payer: Medicare Other | Admitting: Physician Assistant

## 2020-05-06 ENCOUNTER — Ambulatory Visit (HOSPITAL_COMMUNITY)
Admission: RE | Admit: 2020-05-06 | Discharge: 2020-05-06 | Disposition: A | Discharge status: Home / Self Care | Payer: Medicare Other | Source: Other Acute Inpatient Hospital | Attending: Urology | Admitting: Urology

## 2020-05-06 ENCOUNTER — Ambulatory Visit (HOSPITAL_COMMUNITY): Payer: Medicare Other

## 2020-05-06 DIAGNOSIS — Z79890 Hormone replacement therapy (postmenopausal): Secondary | ICD-10-CM

## 2020-05-06 DIAGNOSIS — I824Z2 Acute embolism and thrombosis of unspecified deep veins of left distal lower extremity: Secondary | ICD-10-CM | POA: Diagnosis present

## 2020-05-06 DIAGNOSIS — Z86718 Personal history of other venous thrombosis and embolism: Secondary | ICD-10-CM

## 2020-05-06 DIAGNOSIS — Z79899 Other long term (current) drug therapy: Secondary | ICD-10-CM

## 2020-05-06 DIAGNOSIS — E039 Hypothyroidism, unspecified: Secondary | ICD-10-CM | POA: Diagnosis present

## 2020-05-06 DIAGNOSIS — T451X5A Adverse effect of antineoplastic and immunosuppressive drugs, initial encounter: Secondary | ICD-10-CM | POA: Diagnosis present

## 2020-05-06 DIAGNOSIS — R1900 Intra-abdominal and pelvic swelling, mass and lump, unspecified site: Secondary | ICD-10-CM

## 2020-05-06 DIAGNOSIS — N993 Prolapse of vaginal vault after hysterectomy: Principal | ICD-10-CM | POA: Diagnosis present

## 2020-05-06 DIAGNOSIS — Z981 Arthrodesis status: Secondary | ICD-10-CM

## 2020-05-06 DIAGNOSIS — K5900 Constipation, unspecified: Secondary | ICD-10-CM

## 2020-05-06 DIAGNOSIS — C541 Malignant neoplasm of endometrium: Secondary | ICD-10-CM

## 2020-05-06 DIAGNOSIS — Z91041 Radiographic dye allergy status: Secondary | ICD-10-CM

## 2020-05-06 DIAGNOSIS — N135 Crossing vessel and stricture of ureter without hydronephrosis: Secondary | ICD-10-CM | POA: Diagnosis present

## 2020-05-06 DIAGNOSIS — Z7901 Long term (current) use of anticoagulants: Secondary | ICD-10-CM

## 2020-05-06 DIAGNOSIS — G620 Drug-induced polyneuropathy: Secondary | ICD-10-CM | POA: Diagnosis present

## 2020-05-06 DIAGNOSIS — Z90722 Acquired absence of ovaries, bilateral: Secondary | ICD-10-CM

## 2020-05-06 DIAGNOSIS — D630 Anemia in neoplastic disease: Secondary | ICD-10-CM | POA: Diagnosis present

## 2020-05-06 DIAGNOSIS — Z9221 Personal history of antineoplastic chemotherapy: Secondary | ICD-10-CM

## 2020-05-06 DIAGNOSIS — R5381 Other malaise: Secondary | ICD-10-CM

## 2020-05-06 DIAGNOSIS — Y836 Removal of other organ (partial) (total) as the cause of abnormal reaction of the patient, or of later complication, without mention of misadventure at the time of the procedure: Secondary | ICD-10-CM | POA: Diagnosis present

## 2020-05-06 DIAGNOSIS — N939 Abnormal uterine and vaginal bleeding, unspecified: Secondary | ICD-10-CM | POA: Diagnosis present

## 2020-05-06 DIAGNOSIS — K219 Gastro-esophageal reflux disease without esophagitis: Secondary | ICD-10-CM | POA: Diagnosis present

## 2020-05-06 DIAGNOSIS — J302 Other seasonal allergic rhinitis: Secondary | ICD-10-CM | POA: Diagnosis present

## 2020-05-06 DIAGNOSIS — Z7983 Long term (current) use of bisphosphonates: Secondary | ICD-10-CM

## 2020-05-06 DIAGNOSIS — T81328A Disruption or dehiscence of closure of other specified internal operation (surgical) wound, initial encounter: Secondary | ICD-10-CM

## 2020-05-06 DIAGNOSIS — T8131XA Disruption of external operation (surgical) wound, not elsewhere classified, initial encounter: Secondary | ICD-10-CM

## 2020-05-06 DIAGNOSIS — Z853 Personal history of malignant neoplasm of breast: Secondary | ICD-10-CM

## 2020-05-06 DIAGNOSIS — Z923 Personal history of irradiation: Secondary | ICD-10-CM

## 2020-05-06 DIAGNOSIS — Z20822 Contact with and (suspected) exposure to covid-19: Secondary | ICD-10-CM | POA: Diagnosis present

## 2020-05-06 DIAGNOSIS — M1990 Unspecified osteoarthritis, unspecified site: Secondary | ICD-10-CM | POA: Diagnosis present

## 2020-05-06 DIAGNOSIS — Z803 Family history of malignant neoplasm of breast: Secondary | ICD-10-CM

## 2020-05-06 DIAGNOSIS — M25559 Pain in unspecified hip: Secondary | ICD-10-CM

## 2020-05-06 DIAGNOSIS — Z9109 Other allergy status, other than to drugs and biological substances: Secondary | ICD-10-CM

## 2020-05-06 DIAGNOSIS — N8110 Cystocele, unspecified: Secondary | ICD-10-CM

## 2020-05-06 DIAGNOSIS — Z9079 Acquired absence of other genital organ(s): Secondary | ICD-10-CM

## 2020-05-06 DIAGNOSIS — Z90710 Acquired absence of both cervix and uterus: Secondary | ICD-10-CM

## 2020-05-06 DIAGNOSIS — Z960 Presence of urogenital implants: Secondary | ICD-10-CM | POA: Diagnosis present

## 2020-05-06 DIAGNOSIS — T81321A Disruption or dehiscence of closure of internal operation (surgical) wound of abdominal wall muscle or fascia, initial encounter: Secondary | ICD-10-CM

## 2020-05-06 DIAGNOSIS — Z7952 Long term (current) use of systemic steroids: Secondary | ICD-10-CM | POA: Insufficient documentation

## 2020-05-06 HISTORY — PX: CYSTOSCOPY WITH STENT PLACEMENT: SHX5790

## 2020-05-06 LAB — BASIC METABOLIC PANEL
Anion gap: 10 (ref 5–15)
BUN: 10 mg/dL (ref 8–23)
CO2: 25 mmol/L (ref 22–32)
Calcium: 8.6 mg/dL — ABNORMAL LOW (ref 8.9–10.3)
Chloride: 102 mmol/L (ref 98–111)
Creatinine, Ser: 0.61 mg/dL (ref 0.44–1.00)
GFR, Estimated: >60 mL/min (ref >60)
Glucose, Bld: 163 mg/dL — ABNORMAL HIGH (ref 70–99)
Potassium: 3.5 mmol/L (ref 3.5–5.1)
Sodium: 137 mmol/L (ref 135–145)

## 2020-05-06 SURGERY — CYSTOSCOPY, WITH STENT INSERTION
Anesthesia: General | Laterality: Right

## 2020-05-06 MED ORDER — HYDROMORPHONE HCL 1 MG/ML IJ SOLN
INTRAMUSCULAR | Status: AC
  Filled 2020-05-06: qty 1

## 2020-05-06 MED ORDER — DEXAMETHASONE SODIUM PHOSPHATE 10 MG/ML IJ SOLN
INTRAMUSCULAR | Status: AC
  Filled 2020-05-06: qty 1

## 2020-05-06 MED ORDER — FENTANYL CITRATE (PF) 100 MCG/2ML IJ SOLN
INTRAMUSCULAR | Status: AC
  Filled 2020-05-06: qty 2

## 2020-05-06 MED ORDER — CEFAZOLIN SODIUM-DEXTROSE 2-4 GM/100ML-% IV SOLN
2.0000 g | Freq: Once | INTRAVENOUS | Status: AC
  Administered 2020-05-06: 12:00:00 2 g via INTRAVENOUS
  Filled 2020-05-06: qty 100

## 2020-05-06 MED ORDER — HYDROMORPHONE HCL 1 MG/ML IJ SOLN
0.2500 mg | INTRAMUSCULAR | Status: DC | PRN
  Administered 2020-05-06: 0.25 mg via INTRAVENOUS

## 2020-05-06 MED ORDER — SODIUM CHLORIDE 0.9 % IV BOLUS
500.0000 mL | Freq: Once | INTRAVENOUS | Status: AC
  Administered 2020-05-07: 500 mL via INTRAVENOUS

## 2020-05-06 MED ORDER — CHLORHEXIDINE GLUCONATE 0.12 % MT SOLN
15.0000 mL | Freq: Once | OROMUCOSAL | Status: AC
  Administered 2020-05-06: 10:00:00 15 mL via OROMUCOSAL

## 2020-05-06 MED ORDER — ORAL CARE MOUTH RINSE: 15.0000 mL | Freq: Once | OROMUCOSAL | Status: AC

## 2020-05-06 MED ORDER — ONDANSETRON HCL 4 MG/2ML IJ SOLN
INTRAMUSCULAR | Status: AC
  Administered 2020-05-06: 14:00:00 4 mg via INTRAVENOUS
  Filled 2020-05-06: qty 2

## 2020-05-06 MED ORDER — ONDANSETRON 8 MG PO TBDP
8.0000 mg | ORAL_TABLET | Freq: Once | ORAL | Status: AC
  Administered 2020-05-06: 23:00:00 8 mg via ORAL
  Filled 2020-05-06: qty 1

## 2020-05-06 MED ORDER — SODIUM CHLORIDE 0.9 % IV SOLN: INTRAVENOUS | Status: DC

## 2020-05-06 MED ORDER — PROPOFOL 10 MG/ML IV BOLUS
INTRAVENOUS | Status: AC
  Filled 2020-05-06: qty 20

## 2020-05-06 MED ORDER — ONDANSETRON HCL 4 MG/2ML IJ SOLN
INTRAMUSCULAR | Status: DC | PRN
  Administered 2020-05-06: 4 mg via INTRAVENOUS

## 2020-05-06 MED ORDER — LIDOCAINE HCL (PF) 2 % IJ SOLN
INTRAMUSCULAR | Status: AC
  Filled 2020-05-06: qty 5

## 2020-05-06 MED ORDER — MIDAZOLAM HCL 2 MG/2ML IJ SOLN
INTRAMUSCULAR | Status: AC
  Filled 2020-05-06: qty 2

## 2020-05-06 MED ORDER — LACTATED RINGERS IV SOLN: INTRAVENOUS | Status: DC

## 2020-05-06 MED ORDER — ONDANSETRON HCL 4 MG/2ML IJ SOLN
INTRAMUSCULAR | Status: AC
  Filled 2020-05-06: qty 2

## 2020-05-06 MED ORDER — LIDOCAINE HCL 1 % IJ SOLN
INTRAMUSCULAR | Status: DC | PRN
  Administered 2020-05-06: 100 mg via INTRADERMAL

## 2020-05-06 MED ORDER — ONDANSETRON HCL 4 MG/2ML IJ SOLN: 4.0000 mg | Freq: Once | INTRAMUSCULAR | Status: AC | PRN

## 2020-05-06 MED ORDER — PROPOFOL 10 MG/ML IV BOLUS
INTRAVENOUS | Status: DC | PRN
  Administered 2020-05-06: 120 mg via INTRAVENOUS

## 2020-05-06 MED ORDER — SODIUM CHLORIDE 0.9 % IR SOLN
Status: DC | PRN
  Administered 2020-05-06: 3000 mL

## 2020-05-06 MED ORDER — FENTANYL CITRATE (PF) 100 MCG/2ML IJ SOLN
INTRAMUSCULAR | Status: DC | PRN
  Administered 2020-05-06: 25 ug via INTRAVENOUS
  Administered 2020-05-06: 50 ug via INTRAVENOUS

## 2020-05-06 MED ORDER — FENTANYL CITRATE (PF) 250 MCG/5ML IJ SOLN
INTRAMUSCULAR | Status: AC
  Filled 2020-05-06: qty 5

## 2020-05-06 MED ORDER — MEPERIDINE HCL 50 MG/ML IJ SOLN: 6.2500 mg | INTRAMUSCULAR | Status: DC | PRN

## 2020-05-06 SURGICAL SUPPLY — 14 items
BAG URO CATCHER STRL LF (MISCELLANEOUS) ×3 IMPLANT
CATH INTERMIT  6FR 70CM (CATHETERS) ×3 IMPLANT
CLOTH BEACON ORANGE TIMEOUT ST (SAFETY) ×3 IMPLANT
GLOVE BIOGEL M STRL SZ7.5 (GLOVE) ×3 IMPLANT
GOWN STRL REUS W/TWL LRG LVL3 (GOWN DISPOSABLE) ×6 IMPLANT
GUIDEWIRE STR DUAL SENSOR (WIRE) ×3 IMPLANT
GUIDEWIRE ZIPWRE .038 STRAIGHT (WIRE) IMPLANT
KIT TURNOVER KIT A (KITS) IMPLANT
MANIFOLD NEPTUNE II (INSTRUMENTS) ×3 IMPLANT
PACK CYSTO (CUSTOM PROCEDURE TRAY) ×3 IMPLANT
STENT URET 6FRX24 CONTOUR (STENTS) ×3 IMPLANT
TUBING CONNECTING 10 (TUBING) ×2 IMPLANT
TUBING CONNECTING 10' (TUBING) ×1
TUBING UROLOGY SET (TUBING) IMPLANT

## 2020-05-06 NOTE — Anesthesia Postprocedure Evaluation (Signed)
Anesthesia Post Note  Patient: Liberta Collins Mruk  Procedure(s) Performed: CYSTOSCOPY WITH STENT CHANGE (Right )     Patient location during evaluation: PACU Anesthesia Type: General Level of consciousness: awake and alert Pain management: pain level controlled Vital Signs Assessment: post-procedure vital signs reviewed and stable Respiratory status: spontaneous breathing, nonlabored ventilation, respiratory function stable and patient connected to nasal cannula oxygen Cardiovascular status: blood pressure returned to baseline and stable Postop Assessment: no apparent nausea or vomiting Anesthetic complications: no   No complications documented.  Last Vitals:  Vitals:   05/06/20 1600 05/06/20 1615  BP: (!) 161/66 (!) 148/61  Pulse: 66 72  Resp: 15 15  Temp: 36.4 C (!) 36.4 C  SpO2: 100% 100%    Last Pain:  Vitals:   05/06/20 1615  TempSrc:   PainSc: 0-No pain                 Nysa Sarin DAVID

## 2020-05-06 NOTE — ED Provider Notes (Signed)
[facility] DEPT Provider Note   CSN: [id number] Arrival date & time: 05/06/20  2051     History Chief Complaint  Patient presents with  . Post-op Problem    Erica [name] is a 69 y.o. female.  HPI Patient states she was at home tonight trying to urinate when her "bladder came out of my vagina."  She states that it was out earlier today when she was being prepared for a cystoscopy, to change her ureteral stent.  She states that "the doctor put it back."  It appears that the patient was sent here from    Past Medical History:  Diagnosis Date  . Anemia   . Breast cancer ([place])    right/2006  . Bruises easily   . Endometrial cancer ([place])   . Family history of breast cancer   . GERD (gastroesophageal reflux disease)   . History of chemotherapy   . History of kidney infection   . History of radiation therapy   . Hypothyroidism   . Osteoarthritis   . Osteopenia   . Seasonal allergies   . Ureteral obstruction     Patient Active Problem List   Diagnosis Date Noted  . Acute deep vein thrombosis (DVT) of distal vein of left lower extremity ([place]) 03/02/2020  . Pancytopenia, acquired ([place]) 01/21/2020  . Peripheral neuropathy due to chemotherapy ([place]) 01/21/2020  . Genetic testing 01/08/2020  . Personal history of malignant neoplasm of breast 12/29/2019  . Family history of breast cancer   . Left leg swelling 12/22/2019  . Goals of care, counseling/discussion 12/22/2019  . Deficiency anemia 12/22/2019  . Cancer associated pain 12/22/2019  . Elevated BP without diagnosis of hypertension 12/22/2019  . Endometrial cancer ([place]) 01/02/2019  . Breast cancer, right breast ([place]) 03/13/2014    Past Surgical History:  Procedure Laterality Date  . BACK SURGERY    . BREAST LUMPECTOMY Right 2006   With Lymphnode discetion  . CYSTOSCOPY WITH STENT PLACEMENT Right 12/29/2019   Procedure: CYSTOSCOPY WITH STENT PLACEMENT;  Surgeon: Raynelle Bring,  MD;  Location: [facility];  Service: Urology;  Laterality: Right;  . NM I-131 ABLATION W/THYROGEN ([place] HX)  1970  . OTHER SURGICAL HISTORY  1991   HNP with Fusion   . ROBOTIC ASSISTED TOTAL HYSTERECTOMY WITH BILATERAL SALPINGO OOPHERECTOMY Bilateral 01/16/2019   Procedure: XI ROBOTIC ASSISTED TOTAL HYSTERECTOMY WITH BILATERAL SALPINGO OOPHORECTOMY;  Surgeon: Everitt Amber, MD;  Location: [facility];  Service: Gynecology;  Laterality: Bilateral;  . SENTINEL NODE BIOPSY N/A 01/16/2019   Procedure: SENTINEL LYMPH NODE BIOPSY;  Surgeon: Everitt Amber, MD;  Location: [facility];  Service: Gynecology;  Laterality: N/A;  . TUBAL LIGATION Bilateral 1975     OB History   No obstetric history on file.     Family History  Problem Relation Age of Onset  . Hypertension Mother   . Heart disease Mother   . Cirrhosis Father   . Stroke Maternal Uncle   . Breast cancer Half-Sister        bilateral breast cancer dx in her 37s; pat 1/2 sister  . Lung cancer Half-Sister        smoker; pat 1/2 sibling  . Colon cancer Neg Hx   . Liver disease Neg Hx   . Colon polyps Neg Hx     Social History   Tobacco Use  . Smoking status: Never Smoker  . Smokeless tobacco: Never Used  Vaping Use  . Vaping Use: Never used  Substance Use Topics  . Alcohol use: Never  . Drug use: Never    Home Medications Prior to Admission medications   Medication Sig Start Date End Date Taking? Authorizing Provider  acetaminophen (TYLENOL) 500 MG tablet Take 500 mg by mouth every 6 (six) hours as needed for moderate pain.     [provider]  alendronate (FOSAMAX) 70 MG tablet Take 70 mg by mouth once a week.  12/13/18   [provider]  Calcium Carb-Cholecalciferol (CALCIUM 600+D3) 600-200 MG-UNIT TABS Take 1 tablet by mouth daily.     [provider]  Capsaicin (ARTHRITIS PAIN RELIEF EX) Apply 1 application topically daily as needed (Arthritis pain).    [provider]  dexamethasone (DECADRON) 4 MG  tablet Take 2 tabs at the night before and 2 tab the morning of chemotherapy, every 3 weeks, by mouth x 6 cycles Patient not taking: Reported on 04/20/2020 12/22/19   [name], MD  fluticasone (FLONASE) 50 MCG/ACT nasal spray Place 1 spray into both nostrils daily as needed for allergies or rhinitis.     [provider]  hydrocortisone cream 0.5 % Apply 1 application topically daily as needed for itching.    [provider]  levothyroxine (SYNTHROID) 88 MCG tablet Take 88 mcg by mouth every morning. 12/18/19   [provider]  loratadine (CLARITIN) 10 MG tablet Take 10 mg by mouth daily.    [provider]  naphazoline-pheniramine (ALLERGY EYE) 0.025-0.3 % ophthalmic solution Place 1 drop into both eyes daily as needed for allergies.    [provider]  ondansetron (ZOFRAN) 8 MG tablet Take 1 tablet (8 mg total) by mouth every 8 (eight) hours as needed. Start on the third day after chemotherapy. 12/22/19   [name], MD  oxyCODONE (OXY IR/ROXICODONE) 5 MG immediate release tablet Take 1 tablet (5 mg total) by mouth every 4 (four) hours as needed for severe pain. 12/08/19   Everitt Amber, MD  prochlorperazine (COMPAZINE) 10 MG tablet Take 1 tablet (10 mg total) by mouth every 6 (six) hours as needed (Nausea or vomiting). 12/22/19   [name], MD  rivaroxaban (XARELTO) 20 MG TABS tablet Take 1 tablet (20 mg total) by mouth daily with supper. 03/18/20   [name], MD    Allergies    Gadolinium and Other  Review of Systems   Review of Systems  Physical Exam Updated Vital Signs BP (!) 185/71   Pulse 70   Temp 97.6 F (36.4 C) (Oral)   Resp (!) 23   SpO2 100%   Physical Exam Vitals and nursing note reviewed.  Constitutional:      Appearance: She is well-developed and well-nourished.  HENT:     Head: Normocephalic and atraumatic.     Right Ear: External ear normal.     Left Ear: External ear normal.  Eyes:     Extraocular Movements: EOM  normal.     Conjunctiva/sclera: Conjunctivae normal.     Pupils: Pupils are equal, round, and reactive to light.  Neck:     Trachea: Phonation normal.  Cardiovascular:     Rate and Rhythm: Normal rate and regular rhythm.     Heart sounds: Normal heart sounds.  Pulmonary:     Effort: Pulmonary effort is normal.     Breath sounds: Normal breath sounds.  Chest:     Chest wall: No bony tenderness.  Abdominal:     Palpations: Abdomen is soft.     Tenderness: There  is no abdominal tenderness.  Genitourinary:    Comments: Large soft tissue mass protruding from the vaginal orifice.  Representative images below.  This appears to have internal mucosal tissue, pink to red in color. Musculoskeletal:        General: Normal range of motion.     Cervical back: Normal range of motion and neck supple.  Skin:    General: Skin is warm, dry and intact.  Neurological:     Mental Status: She is alert and oriented to person, place, and time.     Cranial Nerves: No cranial nerve deficit.     Sensory: No sensory deficit.     Motor: No abnormal muscle tone.     Coordination: Coordination normal.  Psychiatric:        Mood and Affect: Mood and affect and mood normal.        Behavior: Behavior normal.        ED Results / Procedures / Treatments   Labs (all labs ordered are listed, but only abnormal results are displayed) Labs Reviewed  BASIC METABOLIC PANEL - Abnormal; Notable for the following components:      Result Value   Glucose, Bld 163 (*)    Calcium 8.6 (*)    All other components within normal limits  CBC WITH DIFFERENTIAL/PLATELET - Abnormal; Notable for the following components:   RBC 2.39 (*)    Hemoglobin 8.7 (*)    HCT 27.1 (*)    MCV 113.4 (*)    MCH 36.4 (*)    Platelets 124 (*)    Lymphs Abs 0.3 (*)    All other components within normal limits  RESP PANEL BY RT-PCR (FLU A&B, COVID) ARPGX2  TYPE AND SCREEN  PREPARE RBC (CROSSMATCH)  SURGICAL PATHOLOGY     EKG None  Radiology CT ABDOMEN PELVIS W CONTRAST  Result Date: 05/07/2020 CLINICAL DATA:  Endometrial cancer.  Pelvic floor prolapse. EXAM: CT ABDOMEN AND PELVIS WITH CONTRAST TECHNIQUE: Multidetector CT imaging of the abdomen and pelvis was performed using the standard protocol following bolus administration of intravenous contrast. CONTRAST:  183mL OMNIPAQUE IOHEXOL 300 MG/ML  SOLN COMPARISON:  03/01/2020 FINDINGS: Lower chest: Centrilobular emphsyema noted. Hepatobiliary: No suspicious focal abnormality within the liver parenchyma. There is no evidence for gallstones, gallbladder wall thickening, or pericholecystic fluid. No intrahepatic or extrahepatic biliary dilation. Pancreas: No focal mass lesion. No dilatation of the main duct. No intraparenchymal cyst. No peripancreatic edema. Spleen: No splenomegaly. No focal mass lesion. Adrenals/Urinary Tract: No adrenal nodule or mass. Left kidney unremarkable. Mild right hydronephrosis noted with double-J internal ureteral stent in situ. Bladder is distended. Gas in the bladder lumen presumably from recent instrumentation. Stomach/Bowel: Stomach is unremarkable. No gastric wall thickening. No evidence of outlet obstruction. Duodenum is normally positioned as is the ligament of Treitz. No small bowel wall thickening. No small bowel dilatation. Relatively large volume of small-bowel projects inferior to the pelvic floor along the expected course of the vagina. This is compatible with reported clinical history of vaginal cuff dehiscence. No small bowel wall thickening or pneumatosis evident at this time. No gross colonic mass. No colonic wall thickening. Vascular/Lymphatic: There is abdominal aortic atherosclerosis without aneurysm. There is no gastrohepatic or hepatoduodenal ligament lymphadenopathy. No retroperitoneal or mesenteric lymphadenopathy. No pelvic sidewall lymphadenopathy on today's exam. 11 mm short axis left pelvic sidewall lymph node measured  previously is 7 mm short axis today on 59/2. Soft tissue in the right pelvis measured previously at 12 mm is  now 7 mm on image 60/2 (immediately posteromedial to the ureteral stent). Reproductive: Uterus surgically absent. No evidence for adnexal mass. Other: Small volume intraperitoneal free fluid. Musculoskeletal: No worrisome lytic or sclerotic osseous abnormality. IMPRESSION: 1. Small-bowel loops extend inferior to the pelvic floor along the expected course of the vagina. This is compatible with reported clinical history of vaginal cuff dehiscence. 2. Interval decrease in size of the left pelvic sidewall lymph node and soft tissue in the right pelvis. No new or progressive findings on today's exam. 3. Mild right hydronephrosis with double-J internal ureteral stent in situ. 4. Small volume intraperitoneal free fluid. 5. Aortic Atherosclerosis (ICD10-I70.0) and Emphysema (ICD10-J43.9). Electronically Signed   By: Misty Stanley M.D.   On: 05/07/2020 05:11   DG C-Arm 1-60 Min-No Report  Result Date: 05/06/2020 Fluoroscopy was utilized by the requesting physician.  No radiographic interpretation.    Procedures Procedures (including critical care time)  Medications Ordered in ED Medications - No data to display  ED Course  I have reviewed the triage vital signs and the nursing notes.  Pertinent labs & imaging results that were available during my care of the patient were reviewed by me and considered in my medical decision making (see chart for details).    MDM Rules/Calculators/A&P                           Patient Vitals for the past 24 hrs:  BP Temp Temp src Pulse Resp SpO2  05/07/20 0845 (!) 184/86 -- -- 71 13 100 %  05/07/20 0830 (!) 182/93 -- -- 70 13 100 %  05/07/20 0815 (!) 191/95 (!) 97.5 F (36.4 C) -- 73 13 100 %  05/07/20 0800 (!) 195/101 (!) 97.3 F (36.3 C) -- 77 16 100 %  05/07/20 0745 (!) 190/94 (!) 97.1 F (36.2 C) -- 75 14 100 %  05/07/20 0730 (!) 198/99 (!) 96.5 F  (35.8 C) -- 83 14 100 %  05/07/20 0715 (!) 187/106 (!) 96.1 F (35.6 C) -- 90 (!) 21 100 %  05/07/20 0254 (!) 186/92 98.2 F (36.8 C) Oral 92 (!) 28 100 %  05/07/20 0145 (!) 210/83 -- -- 94 (!) 24 100 %  05/07/20 0130 (!) 196/84 -- -- 91 (!) 27 100 %  05/07/20 0115 (!) 195/137 -- -- 90 (!) 21 100 %  05/07/20 0100 (!) 192/76 -- -- 84 (!) 26 100 %  05/07/20 0045 (!) 195/90 -- -- 84 (!) 24 100 %  05/07/20 0030 (!) 198/83 -- -- (!) 114 (!) 24 100 %  05/07/20 0015 (!) 188/78 -- -- 84 (!) 24 100 %  05/07/20 0000 (!) 198/76 -- -- 77 16 98 %  05/06/20 2345 139/89 -- -- 70 19 100 %  05/06/20 2330 (!) 191/78 -- -- 77 (!) 25 100 %  05/06/20 2315 (!) 185/84 -- -- 87 (!) 31 100 %  05/06/20 2300 (!) 191/76 -- -- 80 (!) 29 100 %  05/06/20 2245 (!) 181/80 -- -- 83 18 100 %  05/06/20 2230 (!) 179/73 -- -- 67 (!) 22 100 %  05/06/20 2215 (!) 184/72 -- -- 72 (!) 23 100 %  05/06/20 2200 (!) 184/73 -- -- 69 (!) 21 100 %  05/06/20 2145 (!) 185/71 -- -- 70 (!) 23 100 %  05/06/20 2104 138/64 97.6 F (36.4 C) Oral 65 18 100 %     Medical Decision Making:  This patient is  presenting for evaluation of prolapse of tissue from the vagina, which does require a range of treatment options, and is a complaint that involves a moderate risk of morbidity and mortality. The differential diagnoses include internal prolapse of nonspecific internal tissue. I decided to review old records, and in summary elderly female presenting after intervention for ureteral stent placement with new vaginal prolapse.  I did not require additional historical information from anyone.  Clinical Laboratory Tests Ordered, included CBC, Metabolic panel and Covid testing. Review indicates normal except glucose high and hemoglobin low.   Critical Interventions-evaluation, I performed a single attempt at reduction of internal tissue from an apparent vaginal prolapse.  This was unable to be performed because the tissue would not pass the  vaginal introitus.  Patient tolerated this procedure well.  After These Interventions, the Patient was reevaluated and was found with vaginal prolapse, concerning for internal tissue, bowel.  Case discussed with urology Dr. Junious Silk, and gynecology Dr. Domenica Reamer.  Care transition to Dr. Leonides Schanz to contact gynecologic oncology.  CRITICAL CARE-no Performed by: Daleen Bo  Nursing Notes Reviewed/ Care Coordinated Applicable Imaging Reviewed Interpretation of Laboratory Data incorporated into ED treatment      Final Clinical Impression(s) / ED Diagnoses Final diagnoses:  Vaginal prolapse    Rx / DC Orders ED Discharge Orders    None       Daleen Bo, MD 05/07/20 [id number]

## 2020-05-06 NOTE — Anesthesia Preprocedure Evaluation (Signed)
Anesthesia Evaluation  Patient identified by MRN, date of birth, ID band Patient awake    Reviewed: Allergy & Precautions, NPO status , Patient's Chart, lab work & pertinent test results  Airway Mallampati: I  TM Distance: >3 FB Neck ROM: Full    Dental   Pulmonary    Pulmonary exam normal        Cardiovascular Normal cardiovascular exam     Neuro/Psych    GI/Hepatic GERD  Medicated and Controlled,  Endo/Other    Renal/GU      Musculoskeletal   Abdominal   Peds  Hematology   Anesthesia Other Findings   Reproductive/Obstetrics                             Anesthesia Physical Anesthesia Plan  ASA: III  Anesthesia Plan: General   Post-op Pain Management:    Induction: Intravenous  PONV Risk Score and Plan: 3 and Ondansetron and Treatment may vary due to age or medical condition  Airway Management Planned: LMA  Additional Equipment:   Intra-op Plan:   Post-operative Plan: Extubation in OR  Informed Consent: I have reviewed the patients History and Physical, chart, labs and discussed the procedure including the risks, benefits and alternatives for the proposed anesthesia with the patient or authorized representative who has indicated his/her understanding and acceptance.       Plan Discussed with: CRNA and Surgeon  Anesthesia Plan Comments:         Anesthesia Quick Evaluation

## 2020-05-06 NOTE — Op Note (Signed)
Preoperative diagnosis:  1. Right ureteral obstruction   Postoperative diagnosis:  1. Right ureteral obstruction    Procedure:  1. Cystoscopy 2. Right ureteral stent placement (6 x24 - no string)  Surgeon: Roxy Horseman, Brooke Bonito. M.D.  Anesthesia: General  Complications: None  Intraoperative findings: Her indwelling stent was not encrusted.  EBL: Minimal  Specimens: None  Indication: Erica [name] is a 69 y.o. patient with right ureteral obstruction. After reviewing the management options for treatment, he elected to proceed with the above surgical procedure(s). We have discussed the potential benefits and risks of the procedure, side effects of the proposed treatment, the likelihood of the patient achieving the goals of the procedure, and any potential problems that might occur during the procedure or recuperation. Informed consent has been obtained.  Description of procedure:  The patient was taken to the operating room and general anesthesia was induced.  The patient was placed in the dorsal lithotomy position, prepped and draped in the usual sterile fashion, and preoperative antibiotics were administered. A preoperative time-out was performed.   Cystourethroscopy was performed.  The patient's urethra was examined and was unremarkable. The bladder was then systematically examined in its entirety. There was no evidence for any bladder tumors, stones, or other mucosal pathology.    Attention then turned to the right ureteral orifice and the patient's indwelling ureteral stent was identified and brought out to the urethral meatus with the flexible graspers.  A 0.38 sensor guidewire was then advanced up the right ureter into the renal pelvis under fluoroscopic guidance.  The wire was then backloaded through the cystoscope and a ureteral stent was advance over the wire using Seldinger technique.  The stent was positioned appropriately under fluoroscopic and cystoscopic guidance.   The wire was then removed with an adequate stent curl noted in the renal pelvis as well as in the bladder.  The bladder was then emptied and the procedure ended.  The patient appeared to tolerate the procedure well and without complications.  The patient was able to be awakened and transferred to the recovery unit in satisfactory condition.    Pryor Curia MD

## 2020-05-06 NOTE — ED Notes (Signed)
Updated.

## 2020-05-06 NOTE — Anesthesia Procedure Notes (Signed)
Procedure Name: LMA Insertion Date/Time: 05/06/2020 12:28 PM Performed by: West Pugh, CRNA Pre-anesthesia Checklist: Patient identified, Emergency Drugs available, Suction available, Patient being monitored and Timeout performed Patient Re-evaluated:Patient Re-evaluated prior to induction Oxygen Delivery Method: Circle system utilized Preoxygenation: Pre-oxygenation with 100% oxygen Induction Type: IV induction LMA: LMA with gastric port inserted LMA Size: 4.0 Number of attempts: 1 Placement Confirmation: positive ETCO2 and breath sounds checked- equal and bilateral Tube secured with: Tape Dental Injury: Teeth and Oropharynx as per pre-operative assessment

## 2020-05-06 NOTE — Transfer of Care (Signed)
Immediate Anesthesia Transfer of Care Note  Patient: Erica [name]  Procedure(s) Performed: CYSTOSCOPY WITH STENT CHANGE (Right )  Patient Location: PACU  Anesthesia Type:General  Level of Consciousness: awake, drowsy and patient cooperative  Airway & Oxygen Therapy: Patient Spontanous Breathing and Patient connected to face mask oxygen  Post-op Assessment: Report given to RN and Post -op Vital signs reviewed and stable  Post vital signs: Reviewed and stable  Last Vitals:  Vitals Value Taken Time  BP 175/68 05/06/20 1257  Temp 36.4 C 05/06/20 1257  Pulse 72 05/06/20 1259  Resp 15 05/06/20 1259  SpO2 100 % 05/06/20 1259  Vitals shown include unvalidated device data.  Last Pain:  Vitals:   05/06/20 0935  TempSrc:   PainSc: 0-No pain         Complications: No complications documented.

## 2020-05-06 NOTE — Discharge Instructions (Signed)

## 2020-05-06 NOTE — ED Triage Notes (Signed)
Pt presents with c/o post-op issue. Pt reports that she had a stent placed in her kidney today and since she got home has been having some vaginal bleeding and pt now reports that her bladder has completely prolapsed through her vagina.

## 2020-05-06 NOTE — Progress Notes (Signed)
Dr Alinda Money made aware of possible prolapse

## 2020-05-06 NOTE — Progress Notes (Signed)
Patient ambulated to the bathroom with RN and sat on the toilet. When patient was finished, it was noted to be a uterine or vaginal prolapse. Patient then ambulated with RN back to bed and had a near syncope episode. Patient eased down to bed. Patient responding. Placed on monitor, all VS stable. Anesthesia MD at bedside.

## 2020-05-07 ENCOUNTER — Observation Stay (HOSPITAL_COMMUNITY): Payer: Medicare Other | Admitting: Certified Registered"

## 2020-05-07 ENCOUNTER — Emergency Department (HOSPITAL_COMMUNITY): Payer: Medicare Other

## 2020-05-07 ENCOUNTER — Encounter (HOSPITAL_COMMUNITY): Payer: Self-pay

## 2020-05-07 ENCOUNTER — Encounter (HOSPITAL_COMMUNITY)
Admission: EM | Disposition: A | Discharge status: Home / Self Care | Payer: Self-pay | Source: Home / Self Care | Attending: Gynecologic Oncology

## 2020-05-07 DIAGNOSIS — T8131XA Disruption of external operation (surgical) wound, not elsewhere classified, initial encounter: Secondary | ICD-10-CM | POA: Diagnosis not present

## 2020-05-07 DIAGNOSIS — Z803 Family history of malignant neoplasm of breast: Secondary | ICD-10-CM | POA: Diagnosis not present

## 2020-05-07 DIAGNOSIS — Z90710 Acquired absence of both cervix and uterus: Secondary | ICD-10-CM | POA: Diagnosis not present

## 2020-05-07 DIAGNOSIS — Z853 Personal history of malignant neoplasm of breast: Secondary | ICD-10-CM | POA: Diagnosis not present

## 2020-05-07 DIAGNOSIS — Z960 Presence of urogenital implants: Secondary | ICD-10-CM | POA: Diagnosis present

## 2020-05-07 DIAGNOSIS — Z20822 Contact with and (suspected) exposure to covid-19: Secondary | ICD-10-CM | POA: Diagnosis present

## 2020-05-07 DIAGNOSIS — Z79899 Other long term (current) drug therapy: Secondary | ICD-10-CM | POA: Diagnosis not present

## 2020-05-07 DIAGNOSIS — Z7901 Long term (current) use of anticoagulants: Secondary | ICD-10-CM | POA: Diagnosis not present

## 2020-05-07 DIAGNOSIS — Z981 Arthrodesis status: Secondary | ICD-10-CM | POA: Diagnosis not present

## 2020-05-07 DIAGNOSIS — C541 Malignant neoplasm of endometrium: Secondary | ICD-10-CM | POA: Diagnosis present

## 2020-05-07 DIAGNOSIS — Z79890 Hormone replacement therapy (postmenopausal): Secondary | ICD-10-CM | POA: Diagnosis not present

## 2020-05-07 DIAGNOSIS — Y836 Removal of other organ (partial) (total) as the cause of abnormal reaction of the patient, or of later complication, without mention of misadventure at the time of the procedure: Secondary | ICD-10-CM | POA: Diagnosis present

## 2020-05-07 DIAGNOSIS — Z923 Personal history of irradiation: Secondary | ICD-10-CM | POA: Diagnosis not present

## 2020-05-07 DIAGNOSIS — K219 Gastro-esophageal reflux disease without esophagitis: Secondary | ICD-10-CM | POA: Diagnosis present

## 2020-05-07 DIAGNOSIS — Z7983 Long term (current) use of bisphosphonates: Secondary | ICD-10-CM | POA: Diagnosis not present

## 2020-05-07 DIAGNOSIS — N939 Abnormal uterine and vaginal bleeding, unspecified: Secondary | ICD-10-CM | POA: Diagnosis present

## 2020-05-07 DIAGNOSIS — Z9079 Acquired absence of other genital organ(s): Secondary | ICD-10-CM | POA: Diagnosis not present

## 2020-05-07 DIAGNOSIS — N135 Crossing vessel and stricture of ureter without hydronephrosis: Secondary | ICD-10-CM | POA: Diagnosis present

## 2020-05-07 DIAGNOSIS — J302 Other seasonal allergic rhinitis: Secondary | ICD-10-CM | POA: Diagnosis present

## 2020-05-07 DIAGNOSIS — M1990 Unspecified osteoarthritis, unspecified site: Secondary | ICD-10-CM | POA: Diagnosis present

## 2020-05-07 DIAGNOSIS — T81328A Disruption or dehiscence of closure of other specified internal operation (surgical) wound, initial encounter: Secondary | ICD-10-CM | POA: Diagnosis present

## 2020-05-07 DIAGNOSIS — Z86718 Personal history of other venous thrombosis and embolism: Secondary | ICD-10-CM | POA: Diagnosis not present

## 2020-05-07 DIAGNOSIS — E039 Hypothyroidism, unspecified: Secondary | ICD-10-CM | POA: Diagnosis present

## 2020-05-07 DIAGNOSIS — G620 Drug-induced polyneuropathy: Secondary | ICD-10-CM | POA: Diagnosis present

## 2020-05-07 DIAGNOSIS — N993 Prolapse of vaginal vault after hysterectomy: Secondary | ICD-10-CM | POA: Diagnosis present

## 2020-05-07 DIAGNOSIS — D630 Anemia in neoplastic disease: Secondary | ICD-10-CM | POA: Diagnosis present

## 2020-05-07 DIAGNOSIS — Z90722 Acquired absence of ovaries, bilateral: Secondary | ICD-10-CM | POA: Diagnosis not present

## 2020-05-07 DIAGNOSIS — Z9221 Personal history of antineoplastic chemotherapy: Secondary | ICD-10-CM | POA: Diagnosis not present

## 2020-05-07 HISTORY — PX: LAPAROTOMY: SHX154

## 2020-05-07 LAB — COMPREHENSIVE METABOLIC PANEL
ALT: 8 U/L (ref 0–44)
AST: 20 U/L (ref 15–41)
Albumin: 3.3 g/dL — ABNORMAL LOW (ref 3.5–5.0)
Alkaline Phosphatase: 49 U/L (ref 38–126)
Anion gap: 9 (ref 5–15)
BUN: 9 mg/dL (ref 8–23)
CO2: 27 mmol/L (ref 22–32)
Calcium: 8.7 mg/dL — ABNORMAL LOW (ref 8.9–10.3)
Chloride: 105 mmol/L (ref 98–111)
Creatinine, Ser: 0.68 mg/dL (ref 0.44–1.00)
GFR, Estimated: >60 mL/min (ref >60)
Glucose, Bld: 117 mg/dL — ABNORMAL HIGH (ref 70–99)
Potassium: 3.6 mmol/L (ref 3.5–5.1)
Sodium: 141 mmol/L (ref 135–145)
Total Bilirubin: 0.4 mg/dL (ref 0.3–1.2)
Total Protein: 6.8 g/dL (ref 6.5–8.1)

## 2020-05-07 LAB — CBC WITH DIFFERENTIAL/PLATELET
Abs Immature Granulocytes: 0.02 10*3/uL (ref 0.00–0.07)
Basophils Absolute: 0 10*3/uL (ref 0.0–0.1)
Basophils Relative: 0 %
Eosinophils Absolute: 0 10*3/uL (ref 0.0–0.5)
Eosinophils Relative: 0 %
HCT: 27.1 % — ABNORMAL LOW (ref 36.0–46.0)
Hemoglobin: 8.7 g/dL — ABNORMAL LOW (ref 12.0–15.0)
Immature Granulocytes: 0 %
Lymphocytes Relative: 5 %
Lymphs Abs: 0.3 10*3/uL — ABNORMAL LOW (ref 0.7–4.0)
MCH: 36.4 pg — ABNORMAL HIGH (ref 26.0–34.0)
MCHC: 32.1 g/dL (ref 30.0–36.0)
MCV: 113.4 fL — ABNORMAL HIGH (ref 80.0–100.0)
Monocytes Absolute: 0.2 10*3/uL (ref 0.1–1.0)
Monocytes Relative: 3 %
Neutro Abs: 6.7 10*3/uL (ref 1.7–7.7)
Neutrophils Relative %: 92 %
Platelets: 124 10*3/uL — ABNORMAL LOW (ref 150–400)
RBC: 2.39 MIL/uL — ABNORMAL LOW (ref 3.87–5.11)
RDW: 13.4 % (ref 11.5–15.5)
WBC: 7.3 10*3/uL (ref 4.0–10.5)
nRBC: 0 % (ref 0.0–0.2)

## 2020-05-07 LAB — RESP PANEL BY RT-PCR (FLU A&B, COVID) ARPGX2
Influenza A by PCR: NEGATIVE
Influenza B by PCR: NEGATIVE
SARS Coronavirus 2 by RT PCR: NEGATIVE

## 2020-05-07 LAB — PREPARE RBC (CROSSMATCH)

## 2020-05-07 LAB — CBC
HCT: 30.9 % — ABNORMAL LOW (ref 36.0–46.0)
Hemoglobin: 10.4 g/dL — ABNORMAL LOW (ref 12.0–15.0)
MCH: 34.6 pg — ABNORMAL HIGH (ref 26.0–34.0)
MCHC: 33.7 g/dL (ref 30.0–36.0)
MCV: 102.7 fL — ABNORMAL HIGH (ref 80.0–100.0)
Platelets: 133 10*3/uL — ABNORMAL LOW (ref 150–400)
RBC: 3.01 MIL/uL — ABNORMAL LOW (ref 3.87–5.11)
RDW: 19.6 % — ABNORMAL HIGH (ref 11.5–15.5)
WBC: 7.6 10*3/uL (ref 4.0–10.5)
nRBC: 0 % (ref 0.0–0.2)

## 2020-05-07 SURGERY — LAPAROTOMY, EXPLORATORY
Anesthesia: General | Site: Abdomen

## 2020-05-07 MED ORDER — CHLORHEXIDINE GLUCONATE CLOTH 2 % EX PADS
6.0000 | MEDICATED_PAD | Freq: Every day | CUTANEOUS | Status: DC
  Administered 2020-05-08 – 2020-05-10 (×3): 6 via TOPICAL

## 2020-05-07 MED ORDER — FENTANYL CITRATE (PF) 100 MCG/2ML IJ SOLN
25.0000 ug | INTRAMUSCULAR | Status: DC | PRN
Start: 2020-05-07 — End: 2020-05-07

## 2020-05-07 MED ORDER — KCL IN DEXTROSE-NACL 20-5-0.45 MEQ/L-%-% IV SOLN
INTRAVENOUS | Status: DC
  Filled 2020-05-07 (×3): qty 1000

## 2020-05-07 MED ORDER — SODIUM CHLORIDE 0.9% IV SOLUTION: Freq: Once | INTRAVENOUS | Status: DC

## 2020-05-07 MED ORDER — BUPIVACAINE HCL 0.25 % IJ SOLN
INTRAMUSCULAR | Status: AC
  Filled 2020-05-07: qty 1

## 2020-05-07 MED ORDER — PHENYLEPHRINE 40 MCG/ML (10ML) SYRINGE FOR IV PUSH (FOR BLOOD PRESSURE SUPPORT)
PREFILLED_SYRINGE | INTRAVENOUS | Status: DC | PRN
  Administered 2020-05-07: 80 ug via INTRAVENOUS

## 2020-05-07 MED ORDER — SODIUM CHLORIDE (PF) 0.9 % IJ SOLN
INTRAMUSCULAR | Status: DC | PRN
  Administered 2020-05-07: 20 mL

## 2020-05-07 MED ORDER — CEFAZOLIN SODIUM-DEXTROSE 2-4 GM/100ML-% IV SOLN
2.0000 g | INTRAVENOUS | Status: AC
  Administered 2020-05-07: 05:00:00 2 g via INTRAVENOUS

## 2020-05-07 MED ORDER — OXYCODONE HCL 5 MG/5ML PO SOLN: 5.0000 mg | Freq: Once | ORAL | Status: DC | PRN

## 2020-05-07 MED ORDER — GABAPENTIN 100 MG PO CAPS
100.0000 mg | ORAL_CAPSULE | Freq: Two times a day (BID) | ORAL | Status: DC
  Administered 2020-05-07 – 2020-05-11 (×8): 100 mg via ORAL
  Filled 2020-05-07 (×8): qty 1

## 2020-05-07 MED ORDER — SUCCINYLCHOLINE CHLORIDE 200 MG/10ML IV SOSY
PREFILLED_SYRINGE | INTRAVENOUS | Status: DC | PRN
  Administered 2020-05-07: 140 mg via INTRAVENOUS

## 2020-05-07 MED ORDER — LABETALOL HCL 5 MG/ML IV SOLN
5.0000 mg | INTRAVENOUS | Status: DC | PRN
  Filled 2020-05-07: qty 4

## 2020-05-07 MED ORDER — OXYCODONE HCL 5 MG PO TABS
5.0000 mg | ORAL_TABLET | ORAL | Status: DC | PRN
Start: 2020-05-07 — End: 2020-05-11
  Administered 2020-05-08 (×2): 5 mg via ORAL
  Filled 2020-05-07 (×2): qty 1

## 2020-05-07 MED ORDER — FENTANYL CITRATE (PF) 100 MCG/2ML IJ SOLN
50.0000 ug | Freq: Once | INTRAMUSCULAR | Status: AC
  Administered 2020-05-07: 50 ug via INTRAVENOUS
  Filled 2020-05-07: qty 2

## 2020-05-07 MED ORDER — FENTANYL CITRATE (PF) 100 MCG/2ML IJ SOLN
INTRAMUSCULAR | Status: AC
  Filled 2020-05-07: qty 2

## 2020-05-07 MED ORDER — BUPIVACAINE LIPOSOME 1.3 % IJ SUSP
20.0000 mL | Freq: Once | INTRAMUSCULAR | Status: AC
  Administered 2020-05-07: 07:00:00 20 mL
  Filled 2020-05-07: qty 20

## 2020-05-07 MED ORDER — ONDANSETRON HCL 4 MG/2ML IJ SOLN
4.0000 mg | Freq: Once | INTRAMUSCULAR | Status: DC
  Filled 2020-05-07: qty 2

## 2020-05-07 MED ORDER — LIDOCAINE HCL (PF) 2 % IJ SOLN
INTRAMUSCULAR | Status: AC
  Filled 2020-05-07: qty 5

## 2020-05-07 MED ORDER — PROPOFOL 10 MG/ML IV BOLUS
INTRAVENOUS | Status: AC
  Filled 2020-05-07: qty 20

## 2020-05-07 MED ORDER — BISACODYL 10 MG RE SUPP
10.0000 mg | Freq: Once | RECTAL | Status: AC
  Administered 2020-05-07: 22:00:00 10 mg via RECTAL
  Filled 2020-05-07: qty 1

## 2020-05-07 MED ORDER — KETOROLAC TROMETHAMINE 30 MG/ML IJ SOLN: 30.0000 mg | Freq: Once | INTRAMUSCULAR | Status: DC

## 2020-05-07 MED ORDER — IOHEXOL 300 MG/ML  SOLN
100.0000 mL | Freq: Once | INTRAMUSCULAR | Status: AC | PRN
  Administered 2020-05-07: 02:00:00 100 mL via INTRAVENOUS

## 2020-05-07 MED ORDER — BUPIVACAINE HCL 0.25 % IJ SOLN
INTRAMUSCULAR | Status: DC | PRN
  Administered 2020-05-07: 20 mL

## 2020-05-07 MED ORDER — ALBUMIN HUMAN 5 % IV SOLN
INTRAVENOUS | Status: AC
  Filled 2020-05-07: qty 250

## 2020-05-07 MED ORDER — ROCURONIUM BROMIDE 10 MG/ML (PF) SYRINGE
PREFILLED_SYRINGE | INTRAVENOUS | Status: DC | PRN
  Administered 2020-05-07: 40 mg via INTRAVENOUS
  Administered 2020-05-07 (×2): 10 mg via INTRAVENOUS

## 2020-05-07 MED ORDER — ROCURONIUM BROMIDE 10 MG/ML (PF) SYRINGE
PREFILLED_SYRINGE | INTRAVENOUS | Status: AC
  Filled 2020-05-07: qty 10

## 2020-05-07 MED ORDER — FENTANYL CITRATE (PF) 100 MCG/2ML IJ SOLN: 25.0000 ug | INTRAMUSCULAR | Status: DC | PRN

## 2020-05-07 MED ORDER — FENTANYL CITRATE (PF) 100 MCG/2ML IJ SOLN
INTRAMUSCULAR | Status: DC | PRN
  Administered 2020-05-07: 100 ug via INTRAVENOUS
  Administered 2020-05-07 (×2): 50 ug via INTRAVENOUS

## 2020-05-07 MED ORDER — ONDANSETRON HCL 4 MG PO TABS
4.0000 mg | ORAL_TABLET | Freq: Four times a day (QID) | ORAL | Status: DC | PRN
  Administered 2020-05-07: 22:00:00 4 mg via ORAL
  Filled 2020-05-07: qty 1

## 2020-05-07 MED ORDER — LACTATED RINGERS IV SOLN
INTRAVENOUS | Status: DC | PRN
  Administered 2020-05-07: 1000 mL via INTRAVENOUS

## 2020-05-07 MED ORDER — DEXAMETHASONE SODIUM PHOSPHATE 10 MG/ML IJ SOLN
INTRAMUSCULAR | Status: AC
  Filled 2020-05-07: qty 1

## 2020-05-07 MED ORDER — HYDROMORPHONE HCL 1 MG/ML IJ SOLN: 0.5000 mg | INTRAMUSCULAR | Status: DC | PRN

## 2020-05-07 MED ORDER — OXYCODONE HCL 5 MG PO TABS: 5.0000 mg | ORAL_TABLET | Freq: Once | ORAL | Status: DC | PRN

## 2020-05-07 MED ORDER — CEFAZOLIN SODIUM-DEXTROSE 2-4 GM/100ML-% IV SOLN
INTRAVENOUS | Status: AC
  Filled 2020-05-07: qty 100

## 2020-05-07 MED ORDER — 0.9 % SODIUM CHLORIDE (POUR BTL) OPTIME
TOPICAL | Status: DC | PRN
  Administered 2020-05-07: 05:00:00 4000 mL

## 2020-05-07 MED ORDER — LEVOTHYROXINE SODIUM 88 MCG PO TABS
88.0000 ug | ORAL_TABLET | Freq: Every day | ORAL | Status: DC
  Administered 2020-05-08 – 2020-05-11 (×4): 88 ug via ORAL
  Filled 2020-05-07 (×4): qty 1

## 2020-05-07 MED ORDER — ONDANSETRON HCL 4 MG/2ML IJ SOLN
INTRAMUSCULAR | Status: AC
  Filled 2020-05-07: qty 2

## 2020-05-07 MED ORDER — AMISULPRIDE (ANTIEMETIC) 5 MG/2ML IV SOLN: 10.0000 mg | Freq: Once | INTRAVENOUS | Status: DC | PRN

## 2020-05-07 MED ORDER — SODIUM CHLORIDE (PF) 0.9 % IJ SOLN
INTRAMUSCULAR | Status: AC
  Filled 2020-05-07: qty 50

## 2020-05-07 MED ORDER — ACETAMINOPHEN 500 MG PO TABS
500.0000 mg | ORAL_TABLET | Freq: Four times a day (QID) | ORAL | Status: DC | PRN
  Administered 2020-05-08 (×2): 500 mg via ORAL
  Filled 2020-05-07 (×2): qty 1

## 2020-05-07 MED ORDER — LABETALOL HCL 5 MG/ML IV SOLN
5.0000 mg | Freq: Once | INTRAVENOUS | Status: AC
  Administered 2020-05-07: 08:00:00 5 mg via INTRAVENOUS

## 2020-05-07 MED ORDER — ONDANSETRON HCL 4 MG/2ML IJ SOLN: 4.0000 mg | Freq: Once | INTRAMUSCULAR | Status: DC | PRN

## 2020-05-07 MED ORDER — SUGAMMADEX SODIUM 200 MG/2ML IV SOLN
INTRAVENOUS | Status: DC | PRN
  Administered 2020-05-07: 120 mg via INTRAVENOUS

## 2020-05-07 MED ORDER — ALBUMIN HUMAN 5 % IV SOLN: INTRAVENOUS | Status: DC | PRN

## 2020-05-07 MED ORDER — HYDROMORPHONE HCL 1 MG/ML IJ SOLN
1.0000 mg | INTRAMUSCULAR | Status: DC | PRN
  Administered 2020-05-07: 1 mg via INTRAVENOUS
  Filled 2020-05-07: qty 1

## 2020-05-07 MED ORDER — SENNA 8.6 MG PO TABS
1.0000 | ORAL_TABLET | Freq: Two times a day (BID) | ORAL | Status: DC
  Administered 2020-05-07 – 2020-05-10 (×7): 8.6 mg via ORAL
  Filled 2020-05-07 (×7): qty 1

## 2020-05-07 MED ORDER — ONDANSETRON HCL 4 MG/2ML IJ SOLN
INTRAMUSCULAR | Status: DC | PRN
  Administered 2020-05-07: 4 mg via INTRAVENOUS

## 2020-05-07 MED ORDER — LIDOCAINE 2% (20 MG/ML) 5 ML SYRINGE
INTRAMUSCULAR | Status: DC | PRN
  Administered 2020-05-07: 80 mg via INTRAVENOUS

## 2020-05-07 MED ORDER — LABETALOL HCL 5 MG/ML IV SOLN
5.0000 mg | Freq: Once | INTRAVENOUS | Status: AC
  Administered 2020-05-07: 08:00:00 5 mg via INTRAVENOUS
  Filled 2020-05-07: qty 4

## 2020-05-07 MED ORDER — ENOXAPARIN SODIUM 40 MG/0.4ML ~~LOC~~ SOLN
40.0000 mg | SUBCUTANEOUS | Status: DC
  Administered 2020-05-08 – 2020-05-11 (×4): 40 mg via SUBCUTANEOUS
  Filled 2020-05-07 (×4): qty 0.4

## 2020-05-07 MED ORDER — PROPOFOL 10 MG/ML IV BOLUS
INTRAVENOUS | Status: DC | PRN
  Administered 2020-05-07: 100 mg via INTRAVENOUS

## 2020-05-07 MED ORDER — MORPHINE SULFATE (PF) 4 MG/ML IV SOLN
4.0000 mg | Freq: Once | INTRAVENOUS | Status: AC
  Administered 2020-05-07: 02:00:00 4 mg via INTRAVENOUS
  Filled 2020-05-07: qty 1

## 2020-05-07 MED ORDER — ONDANSETRON HCL 4 MG/2ML IJ SOLN
4.0000 mg | Freq: Four times a day (QID) | INTRAMUSCULAR | Status: DC | PRN
  Filled 2020-05-07: qty 2

## 2020-05-07 SURGICAL SUPPLY — 77 items
ADH SKN CLS APL DERMABOND .7 (GAUZE/BANDAGES/DRESSINGS) ×1
AGENT HMST KT MTR STRL THRMB (HEMOSTASIS)
APL PRP STRL LF DISP 70% ISPRP (MISCELLANEOUS) ×1
ATTRACTOMAT 16X20 MAGNETIC DRP (DRAPES) ×2 IMPLANT
BACTOSHIELD CHG 4% 4OZ (MISCELLANEOUS) ×1
BLADE EXTENDED COATED 6.5IN (ELECTRODE) ×2 IMPLANT
CELLS DAT CNTRL 66122 CELL SVR (MISCELLANEOUS) ×1 IMPLANT
CHLORAPREP W/TINT 26 (MISCELLANEOUS) ×2 IMPLANT
CLEANER TIP ELECTROSURG 2X2 (MISCELLANEOUS) ×1 IMPLANT
CLIP VESOCCLUDE LG 6/CT (CLIP) ×2 IMPLANT
CLIP VESOCCLUDE MED 6/CT (CLIP) ×2 IMPLANT
CLIP VESOCCLUDE MED LG 6/CT (CLIP) ×2 IMPLANT
CNTNR URN SCR LID CUP LEK RST (MISCELLANEOUS) IMPLANT
CONT SPEC 4OZ STRL OR WHT (MISCELLANEOUS)
COVER WAND RF STERILE (DRAPES) IMPLANT
DERMABOND ADVANCED (GAUZE/BANDAGES/DRESSINGS) ×1
DERMABOND ADVANCED .7 DNX12 (GAUZE/BANDAGES/DRESSINGS) ×1 IMPLANT
DRAPE INCISE IOBAN 66X45 STRL (DRAPES) ×1 IMPLANT
DRAPE SURG IRRIG POUCH 19X23 (DRAPES) ×2 IMPLANT
DRAPE WARM FLUID 44X44 (DRAPES) ×2 IMPLANT
DRSG OPSITE POSTOP 4X10 (GAUZE/BANDAGES/DRESSINGS) IMPLANT
DRSG OPSITE POSTOP 4X6 (GAUZE/BANDAGES/DRESSINGS) IMPLANT
DRSG OPSITE POSTOP 4X8 (GAUZE/BANDAGES/DRESSINGS) ×1 IMPLANT
ELECT BLADE TIP CTD 4 INCH (ELECTRODE) ×1 IMPLANT
ELECT NEEDLE EXT 1 X 6-1/2 (ELECTRODE) ×1 IMPLANT
ELECT REM PT RETURN 15FT ADLT (MISCELLANEOUS) ×2 IMPLANT
GAUZE 4X4 16PLY RFD (DISPOSABLE) IMPLANT
GLOVE BIOGEL PI IND STRL 7.5 (GLOVE) IMPLANT
GLOVE BIOGEL PI INDICATOR 7.5 (GLOVE) ×3
GLOVE SURG ENC MOIS LTX SZ6 (GLOVE) ×3 IMPLANT
GLOVE SURG ENC MOIS LTX SZ7.5 (GLOVE) ×2 IMPLANT
GOWN STRL REUS W/ TWL LRG LVL3 (GOWN DISPOSABLE) ×2 IMPLANT
GOWN STRL REUS W/ TWL XL LVL3 (GOWN DISPOSABLE) IMPLANT
GOWN STRL REUS W/TWL LRG LVL3 (GOWN DISPOSABLE) ×6
GOWN STRL REUS W/TWL XL LVL3 (GOWN DISPOSABLE) ×2
HANDLE SUCTION POOLE (INSTRUMENTS) IMPLANT
HEMOSTAT ARISTA ABSORB 3G PWDR (HEMOSTASIS) IMPLANT
KIT BASIN OR (CUSTOM PROCEDURE TRAY) ×2 IMPLANT
KIT TURNOVER KIT A (KITS) ×1 IMPLANT
LIGASURE IMPACT 36 18CM CVD LR (INSTRUMENTS) IMPLANT
LOOP VESSEL MAXI BLUE (MISCELLANEOUS) IMPLANT
NEEDLE HYPO 22GX1.5 SAFETY (NEEDLE) ×4 IMPLANT
PACK GENERAL/GYN (CUSTOM PROCEDURE TRAY) ×2 IMPLANT
RELOAD PROXIMATE 75MM BLUE (ENDOMECHANICALS) IMPLANT
RELOAD PROXIMATE TA60MM BLUE (ENDOMECHANICALS) IMPLANT
RELOAD STAPLE 60 BLU REG PROX (ENDOMECHANICALS) IMPLANT
RETRACTOR WND ALEXIS 18 MED (MISCELLANEOUS) IMPLANT
RTRCTR WOUND ALEXIS 18CM MED (MISCELLANEOUS) ×2
SCRUB CHG 4% DYNA-HEX 4OZ (MISCELLANEOUS) ×1 IMPLANT
SHEET LAVH (DRAPES) ×2 IMPLANT
SPONGE LAP 18X18 RF (DISPOSABLE) IMPLANT
STAPLER GUN LINEAR PROX 60 (STAPLE) IMPLANT
STAPLER PROXIMATE 75MM BLUE (STAPLE) IMPLANT
STAPLER VISISTAT 35W (STAPLE) IMPLANT
SUCTION POOLE HANDLE (INSTRUMENTS) ×2
SURGIFLO W/THROMBIN 8M KIT (HEMOSTASIS) IMPLANT
SUT MNCRL AB 4-0 PS2 18 (SUTURE) ×3 IMPLANT
SUT PDS AB 0 CTX 60 (SUTURE) ×2 IMPLANT
SUT PDS AB 1 TP1 96 (SUTURE) ×2 IMPLANT
SUT PDS AB 2-0 CT2 27 (SUTURE) ×10 IMPLANT
SUT SILK 3 0 SH CR/8 (SUTURE) IMPLANT
SUT VIC AB 0 CT1 36 (SUTURE) ×8 IMPLANT
SUT VIC AB 2-0 CT1 36 (SUTURE) ×2 IMPLANT
SUT VIC AB 2-0 CT2 27 (SUTURE) IMPLANT
SUT VIC AB 2-0 SH 27 (SUTURE)
SUT VIC AB 2-0 SH 27X BRD (SUTURE) IMPLANT
SUT VIC AB 3-0 CT1 27 (SUTURE)
SUT VIC AB 3-0 CT1 TAPERPNT 27 (SUTURE) IMPLANT
SUT VIC AB 3-0 CTX 36 (SUTURE) IMPLANT
SUT VIC AB 3-0 SH 18 (SUTURE) IMPLANT
SUT VIC AB 3-0 SH 27 (SUTURE) ×10
SUT VIC AB 3-0 SH 27X BRD (SUTURE) ×1 IMPLANT
SYR 30ML LL (SYRINGE) ×4 IMPLANT
TOWEL OR 17X26 10 PK STRL BLUE (TOWEL DISPOSABLE) ×2 IMPLANT
TOWEL OR NON WOVEN STRL DISP B (DISPOSABLE) ×2 IMPLANT
TRAY FOLEY MTR SLVR 14FR STAT (SET/KITS/TRAYS/PACK) ×1 IMPLANT
UNDERPAD 30X36 HEAVY ABSORB (UNDERPADS AND DIAPERS) ×2 IMPLANT

## 2020-05-07 NOTE — ED Notes (Signed)
This nurse acompanied provider to assess patient status and help transport patient emergently to CT. Applied firsst round of saline moistened gauze per provider order.

## 2020-05-07 NOTE — H&P (Signed)
H&P  Note: Gyn-Onc  Consult was requested by Emergency Department for the evaluation of Erica [name] 69 y.o. female  CC:  Chief Complaint  Patient presents with  . Post-op Problem  vaginal cuff dehiscence with evisceration after cystoscopy and ureteral stent exchange.  Assessment/Plan:  [name]  is a 69 y.o.  year old with recurrent high grade endometrial cancer (on chemotherapy) with a recent history of DVT (on xarelto). She has an acute vaginal cuff dehiscence 71 months post op from her hysterectomy for endometrial cancer staging. It is complicated by evisceration of small intestine.  She requires emergent exploration of the abdomen, possible bowel resection, and repair of the vaginal cuff defect.  She is at high risk for bleeding complication due to her recent xarelto use.  She has baseline anemia. We will obtain a type and cross in the OR and have 2 units available.  Given the risk for bowel necrosis and sepsis from unmanaged evisceration, we are unable to hold off surgery while awaiting reversal of her anticoagulant.   I explained the condition to the patient (in person) and her son (by phone).  I explained our planned operative intervention. I explained the potential risks, however this is unavoidable surgery. They are in agreement to proceed.    HPI: Ms Erica [name] is a 69 year old woman with a history of recurrent endometrial cancer who was seen in consultation at the request of the ER physicians for evaluation of a vaginal cuff dehiscence complicated by evisceration.  Erica [name] was diagnosed with grade 3 endometrial cancer in the summer of 2020. She underwent a robotic total hysterectomy with BSO and SLN biopsy in August of 2020. She was given 30 Gy of vaginal brachytherapy in the postop period (October 15 through April 03, 2019) for high/intermediate uterine risk factors of a stage IA grade 3 cancer.  In July of 2021 she was diagnosed with symptomatic  recurrence of her cancer with left lower extremity edema and pelvic pain and CT imaging on 12/15/19 revealing pelvic nodules, left pelvic iliac node recurrence, ascites and carcinomatosis. There was right ureteral obstruction from recurrence.   She was treated with salvage chemotherapy with carboplatin and paclitaxel commencing in August, 2021. She underwent right ureteral stent placement.   Repeat imaging on 03/01/20 showed improvement in her disease (partial response) with no new lesions. A left LE DVT was identified for which she was started on xarelto.  She lives with her son.  Interval Hx:  On  05/06/20 she underwent right ureteral stent exchange via cystoscopy with Dr Alinda Money. Operative findings were significant for a non-encrusted stent.   In the immediate postop period (prior to discharge) she reported "uterine or vaginal prolapse" after voiding urine. She had a near syncopal episode with this. She reported that a doctor attempted to reduce the mass in the vagina. She was then discharged to home.  Later that evening she noted increasing pressure and pain and bulge from the vagina with voiding. Her son took her [facility] ED. She was noted to have bowel (small intestine) coming from the vagina.  CT abd/pelvis was performed that confirmed small bowel evisceration and a significantly distended urinary bladder. At the time of this note, a formal read had not been performed however the images were viewed by me.   Current Meds: @[facility] @  Allergy:  Allergies  Allergen Reactions  . Gadolinium Nausea And Vomiting     Desc: Pt developed nausea and vomiting after receiving  17cc Multihance. Came back for repeat study and did fine with Magnevist. Erica [name], Onset Date: [id number]   . Other Itching    Powdered gloves    Social Hx:   Social History   Socioeconomic History  . Marital status: Widowed    Spouse name: Not on file  . Number of children: 3  . Years of education: Not on file  .  Highest education level: Not on file  Occupational History  . Occupation: retired  Tobacco Use  . Smoking status: Never Smoker  . Smokeless tobacco: Never Used  Vaping Use  . Vaping Use: Never used  Substance and Sexual Activity  . Alcohol use: Never  . Drug use: Never  . Sexual activity: Yes    Birth control/protection: Surgical  Other Topics Concern  . Not on file  Social History Narrative  . Not on file   Social Determinants of Health   Financial Resource Strain: Not on file  Food Insecurity: Not on file  Transportation Needs: Not on file  Physical Activity: Not on file  Stress: Not on file  Social Connections: Not on file  Intimate Partner Violence: Not on file    Past Surgical Hx:  Past Surgical History:  Procedure Laterality Date  . BACK SURGERY    . BREAST LUMPECTOMY Right 2006   With Lymphnode discetion  . CYSTOSCOPY WITH STENT PLACEMENT Right 12/29/2019   Procedure: CYSTOSCOPY WITH STENT PLACEMENT;  Surgeon: Raynelle Bring, MD;  Location: [facility];  Service: Urology;  Laterality: Right;  . NM I-131 ABLATION W/THYROGEN ([place] HX)  1970  . OTHER SURGICAL HISTORY  1991   HNP with Fusion   . ROBOTIC ASSISTED TOTAL HYSTERECTOMY WITH BILATERAL SALPINGO OOPHERECTOMY Bilateral 01/16/2019   Procedure: XI ROBOTIC ASSISTED TOTAL HYSTERECTOMY WITH BILATERAL SALPINGO OOPHORECTOMY;  Surgeon: Everitt Amber, MD;  Location: [facility];  Service: Gynecology;  Laterality: Bilateral;  . SENTINEL NODE BIOPSY N/A 01/16/2019   Procedure: SENTINEL LYMPH NODE BIOPSY;  Surgeon: Everitt Amber, MD;  Location: [facility];  Service: Gynecology;  Laterality: N/A;  . TUBAL LIGATION Bilateral 1975    Past Medical Hx:  Past Medical History:  Diagnosis Date  . Anemia   . Breast cancer ([place])    right/2006  . Bruises easily   . Endometrial cancer ([place])   . Family history of breast cancer   . GERD (gastroesophageal reflux disease)   . History of chemotherapy   . History of kidney infection   . History of  radiation therapy   . Hypothyroidism   . Osteoarthritis   . Osteopenia   . Seasonal allergies   . Ureteral obstruction     Past Gynecological History:  See HPI No LMP recorded. Patient has had a hysterectomy.  Family Hx:  Family History  Problem Relation Age of Onset  . Hypertension Mother   . Heart disease Mother   . Cirrhosis Father   . Stroke Maternal Uncle   . Breast cancer Half-Sister        bilateral breast cancer dx in her 17s; pat 1/2 sister  . Lung cancer Half-Sister        smoker; pat 1/2 sibling  . Colon cancer Neg Hx   . Liver disease Neg Hx   . Colon polyps Neg Hx     Review of Systems:  Constitutional  Feels unwell, in pain   ENT Normal appearing ears and nares bilaterally Skin/Breast  No rash, sores, jaundice, itching, dryness Cardiovascular  No chest pain, shortness  of breath, or edema  Pulmonary  No cough or wheeze.  Gastro Intestinal  + nausea Genito Urinary  Unable to void, bowel from vagina Musculo Skeletal  No myalgia, arthralgia, joint swelling or pain  Neurologic  No weakness, numbness, change in gait,  Psychology  No depression, anxiety, insomnia.   Vitals:  Blood pressure (!) 186/92, pulse 92, temperature 98.2 F (36.8 C), temperature source Oral, resp. rate (!) 28, SpO2 100 %.  Physical Exam: WD in NAD Neck  Supple NROM, without any enlargements.  Lymph Node Survey No cervical supraclavicular or inguinal adenopathy Cardiovascular  Well perfused peripheries Lungs  No increased WOB Skin  No rash/lesions/breakdown  Psychiatry  Alert and oriented to person, place, and time  Abdomen  soft, non-tender and obese without evidence of hernia.  Genito Urinary  Vulva/vagina: large (10-20cm) length of ileum protruding from vaginal introitus (wrapped in moistened gauze). Pink but engorged (not obviously necrotic). Rectal  deferred Extremities  No bilateral cyanosis, clubbing or edema.  60 minutes of total time was spent for this  patient encounter, including preparation, face-to-face counseling with the patient and coordination of care, review of imaging (results and images), communication with the referring provider and documentation of the encounter.   Thereasa Solo, MD  05/07/2020, 3:50 AM

## 2020-05-07 NOTE — Op Note (Signed)
OPERATIVE NOTE 05/07/20  Preoperative Diagnosis: vaginal cuff dehiscence with evisceration of small intestine, recurrent endometrial cancer on chemotherapy  Postoperative Diagnosis:same    Procedure(s) Performed: Exploratory laparotomy with open repair of vaginal cuff dehiscence.  Surgeon: Thereasa Solo, MD.  Assistant Surgeon: none  Specimens: vaginal cuff   Estimated Blood Loss: 150cc   Urine Output: 800cc  IVF: 2000cc crystalloid 500cc albumin, 1 unit PRBC  Complications: None.   Operative Findings: 30cm of small intestine (ileum) eviscerated through vaginal introitus, engorged but pink. Bowel with palpable pulses in mesentery and viable appearing serosa at completion of procedure. Gaping vaginal cuff, no evidence of laceration (the cuff was smooth, non-bleeding edges, did not appear to be a traumatic dehiscence. No gross intraperitoneal disease palpable.   Procedure:   The patient was seen in the Holding Room. The risks, benefits, complications, treatment options, and expected outcomes were discussed with the patient.  The patient concurred with the proposed plan, giving informed consent.   The patient was  identified as Erica [name]  and the procedure verified as exploratory laparotomy, repair of vaginal cuff, possible bowel resection.   After induction of anesthesia, the patient was draped and prepped in the usual sterile manner.  She was prepped and draped in the normal sterile fashion in the dorsal lithotomy position in padded Allen stirrups with good attention paid to support of the lower back and lower extremities. Position was adjusted for appropriate support.  An attempt was made to reduce the small intestine from the vaginal introitus.  However due to the encroachment of the small intestine and this was not possible.  Therefore the eviscerated small intestine was doused in Betadine and a Foley catheter was inserted into the urinary bladder using aseptic technique.   Approximately 500 cc of yellow urine was evacuated from the bladder from placement of the Foley.    The abdomen was prepped and draped. A midline vertical incision was made and carried through the subcutaneous tissue to the fascia. The fascial incision was made and extended superiorally. The rectus muscles were separated. The peritoneum was identified and entered. Peritoneal incision was extended longitudinally.  The abdominal cavity was entered sharply and without incident. An Printmaker was then placed. A survey of the abdomen and pelvis revealed the above findings.  The cecum and terminal ileum were identified.  The appendix appeared grossly normal.  The ileum was then gently followed and mobilized.  The portion of eviscerated ileum extended from 20 cm from the ileocecal valve for approximately 30 cm segment.  It was carefully delivered into the abdomen by running the bowel.  Once freed from the vagina the bowel was closely inspected.  There was no gross defects in the bowel wall.  The mesentery had 3 areas of deserosalization but intact vasculature.  There is no duskiness to the bowel and palpable pulses were appreciated in the mesentery and arcuate vessels of the ileum throughout the region of the eviscerated bowel.  This segment of eviscerated bowel was somewhat edematous however with the overall normal luminal capacity and no downstream obstruction.  The bowel was carefully packed in the upper abdomen and moist warmed laparotomy sponges were placed over the small intestine. After packing the small bowel into the upper abdomen, the Bookwalter retractor was utilized to retract the bowel into the upper abdomen to expose the pelvic structures.  At this time the gaping vaginal cuff was appreciated.  It did not appear to be traumatically opened as  tissues were atrophic and thin and nonbleeding.  The entirety of the vaginal cuff had separated.  Using careful monopolar  dissection the bladder was dissected from the anterior vagina for approximately 2 cm in length.  Due to the patient being on Xarelto there was modest oozing from capillaries that was made hemostatic with the Bovie.  3-0 Vicryl was also utilized to reinforce hemostasis particularly when in close proximity to the bladder.  Posteriorly the rectovaginal septum was developed to free the posterior edge of the vaginal cuff.  A Bovie and sharp dissection was utilized to perform this mobilization.  Once the vaginal cuff had been skeletonized and tubularized circumferentially a 15 blade scalpel was utilized to excise a 5 mm edge of vaginal cuff to facilitate reapproximation of clean viable tissue.  There was brisk bleeding from the vaginal cuff with this incision which indicated viability of the tissues and recent use of Xarelto.  Judicious use of the Bovie was performed to reinforce hemostasis however there was a desire to minimize use of electrosurgery on this vaginal cuff and therefore hemostasis was largely achieved with closure of the cuff.  Vaginal cuff, closure took place using interrupted figure-of-eight sutures of 2-0 PDS.  A second layer closure of the endopelvic fascia then took place using 0 Vicryl suture to imbricate the PDS sutures.  The rectovaginal septum was reapproximated to the vaginal cuff to aid in hemostasis by using 3-0 Vicryl.  Copious irrigation took place using a total of 3 L of warmed saline. Hemostasis was confirmed at all sites.   The bowel was again closely inspected, with particular focus on the area that had been eviscerated.  Once again pulses were palpated and appreciated at the root of the mesentery as well as at the arcuate vessels along the bowel wall.  The bowel itself appeared pink and perfused and warm to touch.  There was some edema of the bowel wall but no necrosis or gangrenous regions.  The lumen was palpably intact throughout.  A decision was made to avoid resection of  what was apparently viable bowel particularly in an older patient who was malnourished from recent chemotherapy and on active anticoagulation with Xarelto.  The fascia was reapproximated with 0 looped PDS using a total of two sutures. The subcutaneous layer was then irrigated copiously.  Exparel long acting local anesthetic was infiltrated into the subcutaneous tissues. The skin was closed with subcuticular suture. The patient tolerated the procedure well.   The vagina was prepped from below with a sponge stick and digitally to confirm that it was intact.  Sponge, lap and needle counts were correct x 2.   Donaciano Eva, MD

## 2020-05-07 NOTE — Transfer of Care (Signed)
Immediate Anesthesia Transfer of Care Note  Patient: Erica [name]  Procedure(s) Performed: EXPLORATORY LAPAROTOMY AND REPAIR OF VAGINAL CUFF (N/A Abdomen)  Patient Location: PACU  Anesthesia Type:General  Level of Consciousness: awake, alert  and oriented  Airway & Oxygen Therapy: Patient Spontanous Breathing and Patient connected to face mask oxygen  Post-op Assessment: Report given to RN, Post -op Vital signs reviewed and stable and Patient moving all extremities X 4  Post vital signs: Reviewed and stable  Last Vitals:  Vitals Value Taken Time  BP 187/101 05/07/20 0716  Temp    Pulse 84 05/07/20 0720  Resp 20 05/07/20 0720  SpO2 100 % 05/07/20 0720  Vitals shown include unvalidated device data.  Last Pain:  Vitals:   05/07/20 0333  TempSrc:   PainSc: 0-No pain         Complications: No complications documented.

## 2020-05-07 NOTE — ED Notes (Signed)
This RN accompanied a provider to assess the patient's status while waiting for surgery to consult. Provided saline moistened gauze to area per provider order.

## 2020-05-07 NOTE — Anesthesia Preprocedure Evaluation (Addendum)
Anesthesia Evaluation  Patient identified by MRN, date of birth, ID band Patient awake    Reviewed: Allergy & Precautions, NPO status , Patient's Chart, lab work & pertinent test results  Airway Mallampati: I  TM Distance: >3 FB Neck ROM: Full    Dental   Pulmonary    Pulmonary exam normal        Cardiovascular Normal cardiovascular exam     Neuro/Psych    GI/Hepatic GERD  Medicated and Controlled,  Endo/Other    Renal/GU      Musculoskeletal   Abdominal   Peds  Hematology   Anesthesia Other Findings   Reproductive/Obstetrics                             Anesthesia Physical Anesthesia Plan  ASA: III  Anesthesia Plan: General   Post-op Pain Management:    Induction: Rapid sequence and Cricoid pressure planned  PONV Risk Score and Plan: 3 and Ondansetron, Midazolam and Treatment may vary due to age or medical condition  Airway Management Planned: Oral ETT  Additional Equipment:   Intra-op Plan:   Post-operative Plan: Extubation in OR  Informed Consent: I have reviewed the patients History and Physical, chart, labs and discussed the procedure including the risks, benefits and alternatives for the proposed anesthesia with the patient or authorized representative who has indicated his/her understanding and acceptance.       Plan Discussed with: CRNA and Surgeon  Anesthesia Plan Comments: (H/O endometrial cancer with dehiscence of vaginal cuff. Small bowel prolapsing through vagina. -> RSI and GA)       Anesthesia Quick Evaluation

## 2020-05-07 NOTE — ED Notes (Signed)
Made provider aware that Morphine and Toradol had not

## 2020-05-07 NOTE — ED Notes (Signed)
Pt reports that 1 mg Dilaudid was effective for her pain. She is not currently in any pain.

## 2020-05-07 NOTE — ED Notes (Signed)
Made provider aware that Morphine and Fentanyl had not helped with pt's pain. Subsequently administered an order of Dilaudid IV.

## 2020-05-07 NOTE — ED Provider Notes (Signed)
12:00 AM  Assumed care.  Patient is a 69 year old female with history of endometrial cancer status post hysterectomy, bilateral salpingo-oophorectomy, pelvic mass on chemotherapy who presents to the emergency department with complaints of a bladder prolapse.  Patient has history of ureteral obstruction from her pelvic mass and had a right ureteral stent exchange today by Dr. Alinda Money.  She states that she had a prolapse at that time that was reduced in the operating room.  She states symptoms have come back.  On exam, it appears patient has prolapse of bowel.  Dr. Junious Silk with urology and Dr. Payton Spark with GYN have been consulted and they recommend general surgery and/or Gyn Onc consultation.  Will discuss with Dr. Denman George on-call for gynecology oncology.   12:55 AM  Spoke with Dr. Denman George with gynecology oncology.  Appreciate her help.  She recommends obtaining CT of abdomen pelvis for further evaluation and will see the patient in the morning.  Recommends medicine admission.  1:35 AM  Discussed patient's case with hospitalist, Dr. Cyd Silence.  I have recommended admission and patient (and family if present) agree with this plan. Admitting physician will place admission orders.   I reviewed all nursing notes, vitals, pertinent previous records and reviewed/interpreted all EKGs, lab and urine results, imaging (as available).   1:56 AM  Reassessed patient as nurse reports patient's pain is increasing and her blood pressure is elevated.  Suspect hypertension due to pain.  Will give another round of IV pain medication.  Prolapsed bowel appears to have increased in size and is now darker and red in color but there is no dusky appearance or sign of necrosis.  Still active peristalsis seen.  I have discussed this again with Dr. Denman George who will see the patient in the emergency department.  She recommended placing saline soaked gauze on this area.  We will get her CT scan done emergently.  4:00 AM  Pt continues to be  hemodynamically stable.  Bowel still appears well perfused.  Patient taken to the operating room.  I reviewed all nursing notes and pertinent previous records as available.  I have reviewed and interpreted any EKGs, lab and urine results, imaging (as available).   CRITICAL CARE Performed by: Pryor Curia   Total critical care time: 55 minutes  Critical care time was exclusive of separately billable procedures and treating other patients.  Critical care was necessary to treat or prevent imminent or life-threatening deterioration.  Critical care was time spent personally by me on the following activities: development of treatment plan with patient and/or surrogate as well as nursing, discussions with consultants, evaluation of patient's response to treatment, examination of patient, obtaining history from patient or surrogate, ordering and performing treatments and interventions, ordering and review of laboratory studies, ordering and review of radiographic studies, pulse oximetry and re-evaluation of patient's condition.    Malon Siddall, Delice Bison, DO 05/07/20 0430

## 2020-05-07 NOTE — Anesthesia Procedure Notes (Signed)
Procedure Name: Intubation Date/Time: 05/07/2020 4:47 AM Performed by: Tadeo Besecker D, CRNA Pre-anesthesia Checklist: Patient identified, Emergency Drugs available, Suction available and Patient being monitored Patient Re-evaluated:Patient Re-evaluated prior to induction Oxygen Delivery Method: Circle system utilized Preoxygenation: Pre-oxygenation with 100% oxygen Induction Type: IV induction Ventilation: Mask ventilation without difficulty Laryngoscope Size: Mac and 4 Grade View: Grade I Tube type: Oral Tube size: 7.5 mm Number of attempts: 1 Airway Equipment and Method: Stylet and Oral airway Placement Confirmation: ETT inserted through vocal cords under direct vision,  positive ETCO2 and breath sounds checked- equal and bilateral Secured at: 21 cm Tube secured with: Tape Dental Injury: Teeth and Oropharynx as per pre-operative assessment

## 2020-05-07 NOTE — Progress Notes (Signed)
[facility] was initially asked to admit this patient to the medicine service in anticipation of emergent gynecologic surgery overnight. However, after further discussion with Joylene John, NP with Gyn/Onc, the patient has been admitted to the Gyn/Onc service and [place] assistance is no longer needed.  [facility] will sign off for now. Please do not hesitate to reach out with any questions or concerns.   Thank you, Marva Panda, DO

## 2020-05-08 LAB — BPAM RBC
Blood Product Expiration Date: 202201112359
ISSUE DATE / TIME: 202112170632
Unit Type and Rh: 6200

## 2020-05-08 LAB — TYPE AND SCREEN
ABO/RH(D): A POS
Antibody Screen: NEGATIVE
Unit division: 0
Unit division: 0

## 2020-05-08 LAB — BASIC METABOLIC PANEL
Anion gap: 10 (ref 5–15)
BUN: 5 mg/dL — ABNORMAL LOW (ref 8–23)
CO2: 27 mmol/L (ref 22–32)
Calcium: 8.7 mg/dL — ABNORMAL LOW (ref 8.9–10.3)
Chloride: 106 mmol/L (ref 98–111)
Creatinine, Ser: 0.62 mg/dL (ref 0.44–1.00)
GFR, Estimated: >60 mL/min (ref >60)
Glucose, Bld: 116 mg/dL — ABNORMAL HIGH (ref 70–99)
Potassium: 3.5 mmol/L (ref 3.5–5.1)
Sodium: 143 mmol/L (ref 135–145)

## 2020-05-08 LAB — CBC
HCT: 30 % — ABNORMAL LOW (ref 36.0–46.0)
Hemoglobin: 9.6 g/dL — ABNORMAL LOW (ref 12.0–15.0)
MCH: 34.3 pg — ABNORMAL HIGH (ref 26.0–34.0)
MCHC: 32 g/dL (ref 30.0–36.0)
MCV: 107.1 fL — ABNORMAL HIGH (ref 80.0–100.0)
Platelets: 117 10*3/uL — ABNORMAL LOW (ref 150–400)
RBC: 2.8 MIL/uL — ABNORMAL LOW (ref 3.87–5.11)
RDW: 19.4 % — ABNORMAL HIGH (ref 11.5–15.5)
WBC: 6.4 10*3/uL (ref 4.0–10.5)
nRBC: 0 % (ref 0.0–0.2)

## 2020-05-08 NOTE — Progress Notes (Signed)
1 Day Post-Op Procedure(s) (LRB): EXPLORATORY LAPAROTOMY AND REPAIR OF VAGINAL CUFF (N/A)  Subjective: Patient reports  tolerating PO and no problems voiding.    Objective: Vital signs in last 24 hours: Temp:  [97.3 F (36.3 C)-99.3 F (37.4 C)] 98.5 F (36.9 C) (12/18 0124) Pulse Rate:  [75-95] 79 (12/18 0513) Resp:  [12-18] 14 (12/18 0513) BP: (139-185)/(59-84) 145/69 (12/18 0513) SpO2:  [100 %] 100 % (12/18 0513) Last BM Date: 05/06/20  Intake/Output from previous day: 12/17 0701 - 12/18 0700 In: 1373.8 [P.O.:180; I.V.:1193.8] Out: 3450 [Urine:3450]  Physical Examination: General: somnolent GI: soft, non-tender; bowel sounds normal; no masses,  no organomegaly and incision: clean, dry, intact and visualized through dressing Extremities: extremities normal, atraumatic, no cyanosis or edema and Homans sign is negative, no sign of DVT Vaginal Bleeding: none  Labs: WBC/Hgb/Hct/Plts:  6.4/9.6/30.0/117 (12/18 1959) BUN/Cr/glu/ALT/AST/amyl/lip:  5/0.62/--/--/--/--/-- (12/18 7471)   Assessment:  69 y.o. s/p Procedure(s): EXPLORATORY LAPAROTOMY AND REPAIR OF VAGINAL CUFF: progressing well and tolerating diet Pain:  Pain is well-controlled on oral medications.  Heme: Moderate anemia, stable  ID: WBC in range, afebrile.  No signs/symptoms of peritonitis  GI:  Tolerating po: Yes    FEN: Adequate urine output. No significant electrolyte derangements.  Mildly low albumin  Prophylaxis: pharmacologic prophylaxis (with any of the following: enoxaparin (Lovenox) 40mg  SQ 2 hours prior to surgery then every day) and intermittent pneumatic compression boots.  Plan: Advance diet Encourage ambulation Discontinue IV fluids Dispo:  Discharge plan in 2 days   LOS: 1 day    Lahoma Crocker 05/08/2020, 9:03 AM

## 2020-05-09 NOTE — Progress Notes (Signed)
Attempted to ambulate patient in the hallway with a front wheel walker. Patient is very weak, only achieved about 100-150 ft. Patient assist x 1 to BR. Patient had a mucoid like bowel movement medium size. Patient given peri care and returned to bed. Bed exit alarm set and phone placed within reach.

## 2020-05-09 NOTE — Progress Notes (Signed)
2 Days Post-Op Procedure(s) (LRB): EXPLORATORY LAPAROTOMY AND REPAIR OF VAGINAL CUFF (N/A)  Subjective: Patient reports  + flatus and + BM.  Nausea yesterday, no emesis.    Objective: Vital signs in last 24 hours: Temp:  [97.9 F (36.6 C)-98.7 F (37.1 C)] 98.6 F (37 C) (12/19 0548) Pulse Rate:  [78-89] 87 (12/19 0548) Resp:  [18-20] 18 (12/19 0548) BP: (153-195)/(69-79) 158/69 (12/19 0548) SpO2:  [98 %-100 %] 98 % (12/19 0548) Last BM Date: 05/08/20  Intake/Output from previous day: 12/18 0701 - 12/19 0700 In: 180 [P.O.:180] Out: 875 [Urine:875]  Physical Examination: General: somnolent GI: soft, non-tender; bowel sounds normal; no masses,  no organomegaly and incision: clean, dry, intact and visualized through dressing Extremities: extremities normal, atraumatic, no cyanosis or edema and Homans sign is negative, no sign of DVT Vaginal Bleeding: none  Labs:       Assessment:  69 y.o. s/p Procedure(s): EXPLORATORY LAPAROTOMY AND REPAIR OF VAGINAL CUFF: progressing well and tolerating diet Pain:  Pain is well-controlled on oral medications.  ID:  No signs/symptoms of peritonitis  GI:  Tolerating po: Yes     Prophylaxis: pharmacologic prophylaxis (with any of the following: enoxaparin (Lovenox) 40mg  SQ 2 hours prior to surgery then every day) and intermittent pneumatic compression boots.  Plan:  Encourage ambulation Dispo:  Discharge plan in 1 days   LOS: 2 days    Lahoma Crocker 05/09/2020, 9:11 AM

## 2020-05-09 NOTE — Plan of Care (Signed)

## 2020-05-10 ENCOUNTER — Encounter (HOSPITAL_COMMUNITY): Payer: Self-pay | Admitting: Gynecologic Oncology

## 2020-05-10 LAB — SURGICAL PATHOLOGY

## 2020-05-10 NOTE — Progress Notes (Signed)
3 Days Post-Op Procedure(s) (LRB): EXPLORATORY LAPAROTOMY AND REPAIR OF VAGINAL CUFF (N/A)  Subjective: Patient reports having a good bowel movement. Continues to pass flatus. Reporting abdominal soreness. Tolerating regular food. Has not been out of bed today. States she sat in the chair yesterday. States she feels steady when out of bed. She states she was independent with activity prior to surgery. States she lives with one of her sons at home. No concerns voiced.     Objective: Vital signs in last 24 hours: Temp:  [98 F (36.7 C)-98.2 F (36.8 C)] 98 F (36.7 C) (12/20 0502) Pulse Rate:  [61-81] 61 (12/20 0504) Resp:  [16-20] 20 (12/20 0502) BP: (159-172)/(58-81) 161/64 (12/20 0504) SpO2:  [99 %-100 %] 100 % (12/20 0504) Last BM Date: 05/10/20 (pt reported/ small)  Intake/Output from previous day: 12/19 0701 - 12/20 0700 In: 240 [P.O.:240] Out: 1550 [Urine:1550]  Physical Examination: General: alert, cooperative and no distress Resp: clear to auscultation bilaterally Cardio: regular rate and rhythm, S1, S2 normal, no murmur, click, rub or gallop GI: incision: abdomen incision with op site dressing in place with no active drainage noted underneath and abdomen soft, non-distended, slightly tender with palpation, active bowel sounds Extremities: extremities normal, atraumatic, no cyanosis or edema  Labs:  Last 05/08/20  Assessment: 69 y.o. s/p Procedure(s): EXPLORATORY LAPAROTOMY AND REPAIR OF VAGINAL CUFF: stable Pain:  Pain is well-controlled on PRN medications.  Heme: Hgb and Hct from 12/18 stable compared with previous values.  ID: last WBC on 12/18 was 6.4. No signs of peritonitis.   CV: BP and HR stable. Continue to monitor with ordered vital sign assessments.  GI:  Tolerating po: Yes. Antiemetics ordered if needed.   GU: Foley to be removed. Adequate output reported.    FEN: No critical values from labs on 12/18  Prophylaxis: SCD and lovenox. She can resume  her xarelto at discharge per Dr. Denman George.  Plan: After speaking with daughter Erica [name], there were concerns voiced about her mother's decreased mobility post-operatively. She has concerns about her mother being discharged since she feels she has not been frequently mobile, not ambulated in the halls frequently, with little support at home. She would like her mother to be evaluated by case management, PT, and OT. Discussed daughter's concerns with Dr. Berline Lopes who is agreeable with the plan.  Discharge on hold. PT/OT evaluation ordered. Foley removed. Case management consult placed. Plan for possible discharge tomorrow if the above evaluations are completed with recommendations addressed. Contact information given to the daughter for questions etc.        LOS: 3 days    Erica [name] 05/10/2020, 2:39 PM

## 2020-05-10 NOTE — Care Management Important Message (Signed)
Important Message  Patient Details IM Letter given to the Patient. Name: Janaiyah Blackard MRN: [id number] Date of Birth: [birth year]   Medicare Important Message Given:  Yes     Kerin Salen 05/10/2020, 3:19 PM

## 2020-05-10 NOTE — Progress Notes (Signed)
Pt request a 3 in 1 walker/ BSC prior to DC tomorrow. Pt walked 56 ft with rolling walker and stated that only her husbands old walker was at the house and that her toilet was too low to the ground.

## 2020-05-10 NOTE — Progress Notes (Signed)
GYN ONC Progress Note  Patient is sitting in the chair comfortably in no distress. States she continues to have intermittent abdominal cramping but it is better than this am. Has not ambulated. All questions answered. Discussed plan of care including PT/OT eval. No concerns voiced.

## 2020-05-10 NOTE — Progress Notes (Signed)
GYN Oncology Progress Note  Patient sitting on the side of bed eating breakfast. States she is tolerating her diet with no nausea or emesis reported. Reporting abdominal cramping intermittently. Had a small BM. Passing flatus. No concerns voiced. Sat in the chair for a long part of the day yesterday. Has not ambulated in the halls. Plan to see patient later this am with Dr. Berline Lopes.

## 2020-05-11 ENCOUNTER — Other Ambulatory Visit (HOSPITAL_COMMUNITY): Payer: Self-pay | Admitting: Gynecologic Oncology

## 2020-05-11 ENCOUNTER — Telehealth: Payer: Self-pay | Admitting: Oncology

## 2020-05-11 DIAGNOSIS — R5381 Other malaise: Secondary | ICD-10-CM

## 2020-05-11 MED ORDER — OXYCODONE HCL 5 MG PO TABS: 5.0000 mg | ORAL_TABLET | ORAL | 0 refills | Status: DC | PRN

## 2020-05-11 MED ORDER — SENNOSIDES-DOCUSATE SODIUM 8.6-50 MG PO TABS: 2.0000 | ORAL_TABLET | Freq: Every day | ORAL | 0 refills | Status: DC

## 2020-05-11 MED FILL — oxyCODONE HCL 5 MG TABS: 5 | 3 days supply | Qty: 15 | Fill #0

## 2020-05-11 NOTE — TOC Initial Note (Signed)
Transition of Care [facility]) - Initial/Assessment Note    Patient Details  Name: Erica [name] MRN: [id number] Date of Birth: 04/19/1951  [facility] of [facility]) CM/SW Contact:    Lia Hopping, [place] Phone Number: 05/11/2020, 1:44 PM  Clinical Narrative:                 CSW met with the patient at bedside and her daughter Tammy by phone and discussed the patient care plan at home. Patient lives will return home in the are of her son who lives in the home with her. Patient requests RW and 3 in1. CSW ordered equipment through [place] and delivered to the patient bedside.  Home Health for "safety evaluation" . Patient offered choice. [facility] arranged. CSW informed the patient and her daughter insurance will not cover personal care services.  No other needs identified.   Expected Discharge Plan: [place] Barriers to Discharge: Barriers Resolved   Patient Goals and CMS Choice Patient states their goals for this hospitalization and ongoing recovery are:: return home with son CMS Medicare.gov Compare Post Acute Care list provided to:: Patient Choice offered to / list presented to : Patient  Expected Discharge Plan and Services Expected Discharge Plan: [place] In-house Referral: Clinical Social Work   Post Acute Care Choice: [place] arrangements for the past 2 months: [place] Expected Discharge Date: 05/11/20               DME Arranged: 3-N-1,Walker rolling DME Agency: [employer] Date DME Agency Contacted: 05/11/20 Time DME Agency Contacted: [id number] Representative spoke with at DME Agency: Freda Munro [place]: PT [place]: [place] ([place]) Date [place]: 05/11/20 Time [place]: 14 Representative spoke with at [place]: [place]  Prior Living Arrangements/Services Living arrangements for the past 2 months: [place] with:: Adult  Children Patient language and need for interpreter reviewed:: No Do you feel safe going back to the place where you live?: Yes      Need for Family Participation in Patient Care: Yes (Comment) Care giver support system in place?: Yes (comment)   Criminal Activity/Legal Involvement Pertinent to Current Situation/Hospitalization: No - Comment as needed  Activities of Daily Living Home Assistive Devices/Equipment: Blood pressure cuff,Cane (specify quad or straight),Built-in shower seat (single point cane) ADL Screening (condition at time of admission) Patient's cognitive ability adequate to safely complete daily activities?: Yes Is the patient deaf or have difficulty hearing?: No Does the patient have difficulty seeing, even when wearing glasses/contacts?: No Does the patient have difficulty concentrating, remembering, or making decisions?: No Patient able to express need for assistance with ADLs?: Yes Does the patient have difficulty dressing or bathing?: No Independently performs ADLs?: Yes (appropriate for developmental age) Does the patient have difficulty walking or climbing stairs?: No Weakness of Legs: Both Weakness of Arms/Hands: None  Permission Sought/Granted Permission sought to share information with : Family Supports Permission granted to share information with : Yes, Verbal Permission Granted  Share Information with NAME: [place] granted to share info w AGENCY: [place] granted to share info w Relationship: Daughter  Permission granted to share info w Contact Information: [phone]  Emotional Assessment Appearance:: Appears stated age Attitude/Demeanor/Rapport: Engaged Affect (typically observed): Accepting Orientation: : Oriented to Self,Oriented to Place,Oriented to  Time,Oriented to Situation Alcohol / Substance Use: Not Applicable Psych Involvement: No (comment)  Admission diagnosis:  Vaginal bleeding  [N93.9] Vaginal prolapse [N81.10] Vaginal cuff dehiscence, initial encounter [T81.31XA] Perioperative dehiscence of abdominal wound with evisceration, initial encounter [T81.31XA] Patient Active Problem List   Diagnosis Date Noted  . Physical deconditioning 05/11/2020  . Vaginal bleeding 05/07/2020  . Vaginal cuff dehiscence, initial encounter 05/07/2020  . Perioperative dehiscence of abdominal wound with evisceration 05/07/2020  . Acute deep vein thrombosis (DVT) of distal vein of left lower extremity ([place]) 03/02/2020  . Pancytopenia, acquired ([place]) 01/21/2020  . Peripheral neuropathy due to chemotherapy ([place]) 01/21/2020  . Genetic testing 01/08/2020  . Personal history of malignant neoplasm of breast 12/29/2019  . Family history of breast cancer   . Left leg swelling 12/22/2019  . Goals of care, counseling/discussion 12/22/2019  . Deficiency anemia 12/22/2019  . Cancer associated pain 12/22/2019  . Elevated BP without diagnosis of hypertension 12/22/2019  . Endometrial cancer ([place]) 01/02/2019  . Breast cancer, right breast ([place]) 03/13/2014   PCP:  [name], MD Pharmacy:   [facility] [place], [place] - [address] [place] [address] Phone: [phone] Fax: [phone]  [place], [place] - [address] [place] [address] Phone: [phone] Fax: [phone]     Social Determinants of Health (SDOH) Interventions    Readmission Risk Interventions No flowsheet data found.

## 2020-05-11 NOTE — Progress Notes (Signed)
Discharge instructions given to patient and all questions were answered.  

## 2020-05-11 NOTE — Discharge Summary (Signed)
Physician Discharge Summary  Patient ID: Erica [name] MRN: [id number] DOB/AGE: [birth year] 69 y.o.  Admit date: 05/06/2020 Discharge date: 05/11/2020  Admission Diagnoses: Vaginal cuff dehiscence, initial encounter  Discharge Diagnoses:  Principal Problem:   Vaginal cuff dehiscence, initial encounter Active Problems:   Endometrial cancer ([place])   Acute deep vein thrombosis (DVT) of distal vein of left lower extremity (HCC)   Vaginal bleeding   Physical deconditioning   Discharged Condition:  The patient is in good condition and stable for discharge.    Hospital Course: On 05/06/20, the patient presented to the ER with reports of vaginal bleeding and questionable bladder prolapse from the vagina s/p cystoscopy with stent placement on the same day. A CT AP was performed revealing small bowel loops extending inferior to the pelvic floor along the expected course of the vagina. Of note, she is s/p hysterectomy for endometrial cancer staging 17 months ago. On 05/07/2020, the patient underwent the following: Procedure(s): EXPLORATORY LAPAROTOMY AND REPAIR OF VAGINAL CUFF. The postoperative course was uneventful.  She was discharged to home on postoperative day 4 tolerating a regular diet, ambulating with walker without difficulty, having bowel movements/passing flatus, pain controlled with oral medications, voiding. She is discharged home with HHPT for home safety evaluation as recommended from her inpatient PT evaluation.   Consults: PT/OT  Significant Diagnostic Studies:  CT AP performed in the ER on 05/07/2020, Labs  Treatments: Surgery: see above  Discharge Exam (am assessment): Blood pressure 127/62, pulse 76, temperature 98.1 F (36.7 C), temperature source Oral, resp. rate 18, SpO2 100 %. General appearance: alert, cooperative, appears stated age and no distress Resp: clear to auscultation bilaterally Cardio: regular rate and rhythm, S1, S2 normal, no murmur, click, rub or  gallop GI: soft, non-tender; bowel sounds normal; no masses,  no organomegaly Extremities: extremities normal, atraumatic, no cyanosis or edema Incision/Wound: Midline abdominal incision with op site dressing in place with no active drainage noted underneath  Disposition: Discharge disposition: 06-Home-Health Care Svc       Discharge Instructions    Call MD for:  difficulty breathing, headache or visual disturbances   Complete by: As directed    Call MD for:  extreme fatigue   Complete by: As directed    Call MD for:  hives   Complete by: As directed    Call MD for:  persistant dizziness or light-headedness   Complete by: As directed    Call MD for:  persistant nausea and vomiting   Complete by: As directed    Call MD for:  redness, tenderness, or signs of infection (pain, swelling, redness, odor or green/yellow discharge around incision site)   Complete by: As directed    Call MD for:  severe uncontrolled pain   Complete by: As directed    Call MD for:  temperature >100.4   Complete by: As directed    Diet - low sodium heart healthy   Complete by: As directed    Discharge wound care:   Complete by: As directed    You can remove the dressing from over your incision 5-7 days after surgery. You do not need to reapply a new dressing.   Driving Restrictions   Complete by: As directed    No driving for 2 weeks after surgery if you were cleared to drive before surgery.  Do not take narcotics and drive.   Increase activity slowly   Complete by: As directed    Lifting restrictions   Complete by: As  directed    No lifting greater than 10 lbs for 6 weeks   Sexual Activity Restrictions   Complete by: As directed    No sexual activity, nothing in the vagina, for 8 weeks.     Allergies as of 05/11/2020      Reactions   Gadolinium Nausea And Vomiting    Desc: Pt developed nausea and vomiting after receiving 17cc Multihance. Came back for repeat study and did fine with Magnevist.  [name], Onset Date: [id number]   Other Itching   Powdered gloves      Medication List    TAKE these medications   acetaminophen 500 MG tablet Commonly known as: TYLENOL Take 500 mg by mouth every 6 (six) hours as needed for moderate pain.   alendronate 70 MG tablet Commonly known as: FOSAMAX Take 70 mg by mouth once a week.   Allergy Eye 0.025-0.3 % ophthalmic solution Generic drug: naphazoline-pheniramine Place 1 drop into both eyes daily as needed for allergies.   ARTHRITIS PAIN RELIEF EX Apply 1 application topically daily as needed (Arthritis pain).   Calcium 600+D3 600-200 MG-UNIT Tabs Generic drug: Calcium Carbonate+Vitamin D Take 1 tablet by mouth daily.   dexamethasone 4 MG tablet Commonly known as: DECADRON Take 2 tabs at the night before and 2 tab the morning of chemotherapy, every 3 weeks, by mouth x 6 cycles   fluticasone 50 MCG/ACT nasal spray Commonly known as: FLONASE Place 1 spray into both nostrils daily as needed for allergies or rhinitis.   hydrocortisone cream 0.5 % Apply 1 application topically daily as needed for itching.   levothyroxine 88 MCG tablet Commonly known as: SYNTHROID Take 88 mcg by mouth every morning.   loratadine 10 MG tablet Commonly known as: CLARITIN Take 10 mg by mouth daily.   ondansetron 8 MG tablet Commonly known as: Zofran Take 1 tablet (8 mg total) by mouth every 8 (eight) hours as needed. Start on the third day after chemotherapy.   oxyCODONE 5 MG immediate release tablet Commonly known as: Oxy IR/ROXICODONE Take 1 tablet (5 mg total) by mouth every 4 (four) hours as needed for severe pain. For AFTER surgery, do not take and drive What changed: additional instructions   prochlorperazine 10 MG tablet Commonly known as: COMPAZINE Take 1 tablet (10 mg total) by mouth every 6 (six) hours as needed (Nausea or vomiting).   rivaroxaban 20 MG Tabs tablet Commonly known as: XARELTO Take 1 tablet (20 mg total) by  mouth daily with supper.   senna-docusate 8.6-50 MG tablet Commonly known as: Senokot-S Take 2 tablets by mouth at bedtime. For AFTER surgery to prevent constipation, do not take if having diarrhea            Durable Medical Equipment  (From admission, onward)         Start     Ordered   05/11/20 1224  For home use only DME 3 n 1  Once        05/11/20 1223   05/11/20 1224  For home use only DME Walker rolling  Once       Question Answer Comment  Walker: With [place] Wheels   Patient needs a walker to treat with the following condition Physical deconditioning      05/11/20 1223   05/11/20 1001  For home use only DME Bedside commode  Once       Question:  Patient needs a bedside commode to treat with the following condition  Answer:  Increased weakness when ambulating   05/11/20 1000   05/11/20 1001  For home use only DME Walker rolling  Once       Question Answer Comment  Walker: With 5 Inch Wheels   Patient needs a walker to treat with the following condition Mobility impaired      05/11/20 1000           Discharge Care Instructions  (From admission, onward)         Start     Ordered   05/11/20 0000  Discharge wound care:       Comments: You can remove the dressing from over your incision 5-7 days after surgery. You do not need to reapply a new dressing.   05/11/20 1246          Follow-up Information    Adolphus Birchwood, MD Follow up on 05/25/2020.   Specialty: Gynecologic Oncology Why: at 3pm at the [facility] for a post-op check. Contact information: [address] [place] [address] [phone]               Greater than thirty minutes were spend for face to face discharge instructions and discharge orders/summary in EPIC.   Signed: Doylene Bode 05/11/2020, 1:05 PM

## 2020-05-11 NOTE — Anesthesia Postprocedure Evaluation (Signed)
Anesthesia Post Note  Patient: Erica [name]  Procedure(s) Performed: EXPLORATORY LAPAROTOMY AND REPAIR OF VAGINAL CUFF (N/A Abdomen)     Patient location during evaluation: PACU Anesthesia Type: General Level of consciousness: awake and alert Pain management: pain level controlled Vital Signs Assessment: post-procedure vital signs reviewed and stable Respiratory status: spontaneous breathing, nonlabored ventilation and respiratory function stable Cardiovascular status: blood pressure returned to baseline and stable Postop Assessment: no apparent nausea or vomiting Anesthetic complications: no   No complications documented.  Last Vitals:  Vitals:   05/10/20 2140 05/11/20 0732  BP: (!) 174/84 127/62  Pulse: 93 76  Resp: 18 18  Temp: 37 C 36.7 C  SpO2: 100% 100%    Last Pain:  Vitals:   05/11/20 0844  TempSrc:   PainSc: 0-No pain   Pain Goal: Patients Stated Pain Goal: 2 (05/10/20 0730)                 Lidia Collum

## 2020-05-11 NOTE — Telephone Encounter (Signed)
[facility] regarding her diagnostic mammogram and ultrasound scheduled for tomorrow.  She would like to have it rescheduled due to her recent surgery.  Called the [facility] and appointment was rescheduled to 05/28/20.  Notified Britnie of new appointment.

## 2020-05-11 NOTE — Evaluation (Addendum)
Physical Therapy Evaluation Patient Details Name: Erica [name] MRN: [id number] DOB: 08-23-1950 Today's Date: 05/11/2020   History of Present Illness  69 y.o.  year old with recurrent high grade endometrial cancer (on chemotherapy) with a recent history of DVT (on xarelto).  She has an acute vaginal cuff dehiscence 17 months post op from her hysterectomy for endometrial cancer staging. It is complicated by evisceration of small intestine.   s/p exploratory laparoscopy with  vaginal cuff repair 05/07/20.  Clinical Impression  Pt ambulated 400' with a RW, no loss of balance. She is safe to DC home from a PT standpoint. No further PT indicated as pt is modified independent with mobility. PT signing off.     Follow Up Recommendations  HHPT for home safety evaluation    Equipment Recommendations  Rolling walker, bedside commode   Recommendations for Other Services       Precautions / Restrictions Precautions Precautions: None Precaution Comments: pt denies falls in past 1 year Restrictions Weight Bearing Restrictions: No      Mobility  Bed Mobility Overal bed mobility: Independent                  Transfers Overall transfer level: Independent                  Ambulation/Gait Ambulation/Gait assistance: Modified independent (Device/Increase time) Gait Distance (Feet): 400 Feet Assistive device: Rolling walker (2 wheeled) Gait Pattern/deviations: Step-through pattern;Trunk flexed Gait velocity: decr   General Gait Details: VCs for posture, pt reports that standing up tall pulls on her incision, no loss of balance  Stairs            Wheelchair Mobility    Modified Rankin (Stroke Patients Only)       Balance Overall balance assessment: Modified Independent                                           Pertinent Vitals/Pain Pain Assessment: 0-10 Pain Score: 4  Pain Location: abdomen Pain Descriptors / Indicators:  Cramping Pain Intervention(s): Limited activity within patient's tolerance;Monitored during session;Premedicated before session    Home Living Family/patient expects to be discharged to:: Private residence Living Arrangements: Children Available Help at Discharge: Family;Available PRN/intermittently   Home Access: Stairs to enter   Entrance Stairs-Number of Steps: 2 Home Layout: One level Home Equipment: None Additional Comments: doesn't drive, uses bus, son works so is available prn    Prior Function Level of Independence: Independent               Hand Dominance        Extremity/Trunk Assessment   Upper Extremity Assessment Upper Extremity Assessment: Overall WFL for tasks assessed    Lower Extremity Assessment Lower Extremity Assessment: Overall WFL for tasks assessed    Cervical / Trunk Assessment Cervical / Trunk Assessment: Normal  Communication   Communication: No difficulties  Cognition Arousal/Alertness: Awake/alert Behavior During Therapy: WFL for tasks assessed/performed Overall Cognitive Status: Within Functional Limits for tasks assessed                                        General Comments      Exercises     Assessment/Plan    PT Assessment Patent does not need any further PT  services  PT Problem List         PT Treatment Interventions      PT Goals (Current goals can be found in the Care Plan section)  Acute Rehab PT Goals Patient Stated Goal: go to church, cook PT Goal Formulation: All assessment and education complete, DC therapy    Frequency     Barriers to discharge        Co-evaluation               AM-PAC PT "6 Clicks" Mobility  Outcome Measure Help needed turning from your back to your side while in a flat bed without using bedrails?: None Help needed moving from lying on your back to sitting on the side of a flat bed without using bedrails?: None Help needed moving to and from a bed to a  chair (including a wheelchair)?: None Help needed standing up from a chair using your arms (e.g., wheelchair or bedside chair)?: None Help needed to walk in hospital room?: None Help needed climbing 3-5 steps with a railing? : A Little 6 Click Score: 23    End of Session   Activity Tolerance: Patient tolerated treatment well Patient left: in chair;with call bell/phone within reach Nurse Communication: Mobility status      Time: [id number] PT Time Calculation (min) (ACUTE ONLY): 18 min   Charges:   PT Evaluation $PT Eval Low Complexity: 1 Low         Philomena Doheny PT 05/11/2020  Acute Rehabilitation Services Pager [phone] Office [phone]

## 2020-05-11 NOTE — Progress Notes (Signed)
4 Days Post-Op Procedure(s) (LRB): EXPLORATORY LAPAROTOMY AND REPAIR OF VAGINAL CUFF (N/A)  Subjective: Patient reports feeling better this am. The abdominal cramping is improving. She is having bowel movements and passing flatus. Tolerating regular food with no nausea or emesis. States she feels steady when out of bed. She ambulated in the hall yesterday and tolerated it well. No concerns voiced.     Objective: Vital signs in last 24 hours: Temp:  [98.1 F (36.7 C)-98.6 F (37 C)] 98.1 F (36.7 C) (12/21 0732) Pulse Rate:  [65-93] 76 (12/21 0732) Resp:  [18] 18 (12/21 0732) BP: (127-174)/(62-84) 127/62 (12/21 0732) SpO2:  [99 %-100 %] 100 % (12/21 0732) Last BM Date: 05/11/20  Intake/Output from previous day: 12/20 0701 - 12/21 0700 In: 840 [P.O.:840] Out: 1000 [Urine:1000]  Physical Examination: General: alert, cooperative and no distress Resp: clear to auscultation bilaterally Cardio: regular rate and rhythm, S1, S2 normal, no murmur, click, rub or gallop GI: incision: abdomen incision with op site dressing in place with no active drainage noted underneath and abdomen soft, non-distended, active bowel sounds Extremities: extremities normal, atraumatic, no cyanosis or edema  Labs:  Last 05/08/20  Assessment: 69 y.o. s/p Procedure(s): EXPLORATORY LAPAROTOMY AND REPAIR OF VAGINAL CUFF: stable Pain:  Pain is well-controlled on PRN medications.  Heme: Hgb and Hct from 12/18 stable compared with previous values.  ID: last WBC on 12/18 was 6.4. No signs of peritonitis.   CV: BP and HR stable. Continue to monitor with ordered vital sign assessments.  GI:  Tolerating po: Yes. Antiemetics ordered if needed.   GU: Voiding without difficulty. Adequate output reported.    FEN: No critical values from labs on 12/18  Prophylaxis: SCD and lovenox. She can resume her xarelto at discharge per Dr. Denman George.  Plan: Awaiting PT/OT eval prior to discharge Case manager consulted for  home needs Plan for possible discharge later today based on recommendations from PT/OT Encourage ambulation, IS use   LOS: 4 days    Dorothyann Gibbs 05/11/2020, 9:19 AM

## 2020-05-11 NOTE — Evaluation (Signed)
Occupational Therapy Evaluation Patient Details Name: Erica [name] MRN: [id number] DOB: 03-02-1951 Today's Date: 05/11/2020    History of Present Illness 69 y.o.  year old with recurrent high grade endometrial cancer (on chemotherapy) with a recent history of DVT (on xarelto).  She has an acute vaginal cuff dehiscence 17 months post op from her hysterectomy for endometrial cancer staging. It is complicated by evisceration of small intestine.   s/p exploratory laparoscopy with  vaginal cuff repair 05/07/20.   Clinical Impression   Patient evaluated by Occupational Therapy with no further acute OT needs identified. All education has been completed and the patient has no further questions.  See below for any follow-up Occupational Therapy or equipment needs. OT is signing off. Thank you for this referral.     Follow Up Recommendations  No OT follow up    Equipment Recommendations   (Noted that BSC and RW already to room.)    Recommendations for Other Services       Precautions / Restrictions Precautions Precautions: None Precaution Comments: pt denies falls in past 1 year Restrictions Weight Bearing Restrictions: No      Mobility Bed Mobility Overal bed mobility: Independent             General bed mobility comments: Pt up in chair    Transfers Overall transfer level: Modified independent Equipment used: Rolling walker (2 wheeled)             General transfer comment: Pt had a small loss of balance with initial stand from chair wihtout RW.  Pt was encouraged to use RW for increased safety and agreed to this with improved balance noted.  Daughter made aware.    Balance Overall balance assessment: Mild deficits observed, not formally tested                                         ADL either performed or assessed with clinical judgement   ADL Overall ADL's : At baseline;Modified independent                     Lower Body  Dressing: Supervision/safety;Set up Lower Body Dressing Details (indicate cue type and reason): Pt educated on use of long reacher for taks simplification and pain mngt and daughter reports that one may be at the house, or she will otherwise order one Toilet Transfer: Supervision/safety;RW;Ambulation Armed forces technical officer Details (indicate cue type and reason): Pt and daughter ed on setup of 3-in-1 by bed at night and over toilet during day. Ed on how to position and select appropriate height Toileting- Clothing Manipulation and Hygiene: Modified independent       Functional mobility during ADLs: Supervision/safety;Rolling walker       Vision   Vision Assessment?: No apparent visual deficits     Perception     Praxis      Pertinent Vitals/Pain Pain Assessment: 0-10 Pain Score: 4  Pain Location: abdomen Pain Descriptors / Indicators: Cramping Pain Intervention(s): Limited activity within patient's tolerance;Monitored during session;Premedicated before session     Hand Dominance Left   Extremity/Trunk Assessment Upper Extremity Assessment Upper Extremity Assessment: Overall WFL for tasks assessed   Lower Extremity Assessment Lower Extremity Assessment: Overall WFL for tasks assessed   Cervical / Trunk Assessment Cervical / Trunk Assessment: Normal   Communication Communication Communication: No difficulties   Cognition Arousal/Alertness: Awake/alert Behavior During Therapy: [facility]  for tasks assessed/performed Overall Cognitive Status: History of cognitive impairments - at baseline                                 General Comments: Pleasant and cooperative. Dtr endorsed baseline impaired memory.   General Comments       Exercises     Shoulder Instructions      Home Living Family/patient expects to be discharged to:: Private residence Living Arrangements: Children;Other (Comment) (Pt reports that she usually lives alone but her son will come and stay  temporarily. He works part time at night.  Other family members can assist with supervision and care.) Available Help at Discharge: Family;Available PRN/intermittently Type of Home: House Home Access: Stairs to enter [employer] of Steps: 2 Entrance Stairs-Rails: Can reach both Home Layout: One level     Bathroom Shower/Tub: Occupational psychologist: Standard     Home Equipment: Shower seat;Cane - single point   Additional Comments: doesn't drive, uses bus or family provides, reports independence with medications.  Per daughter over phone, pt with memory impairements and will reacll "about 20% of what you say." therefore pt may be an unreliable historian in places.      Prior Functioning/Environment Level of Independence: Independent                 OT Problem List:        OT Treatment/Interventions:      OT Goals(Current goals can be found in the care plan section) Acute Rehab OT Goals Patient Stated Goal: Resume all activity and eventually wean off RW. OT Goal Formulation: With patient/family Potential to Achieve Goals: Good  OT Frequency:     Barriers to D/C:            Co-evaluation              AM-PAC OT "6 Clicks" Daily Activity     Outcome Measure Help from another person eating meals?: None Help from another person taking care of personal grooming?: None Help from another person toileting, which includes using toliet, bedpan, or urinal?: None Help from another person bathing (including washing, rinsing, drying)?: A Little Help from another person to put on and taking off regular upper body clothing?: None Help from another person to put on and taking off regular lower body clothing?: None 6 Click Score: 23   End of Session Equipment Utilized During Treatment: Rolling walker Nurse Communication: Mobility status  Activity Tolerance: Patient tolerated treatment well Patient left: in chair;with call bell/phone within reach  OT  Visit Diagnosis: Pain Pain - part of body:  (incisional area)                Time: 2774-1287 OT Time Calculation (min): 31 min Charges:  OT General Charges $OT Visit: 1 Visit OT Evaluation $OT Eval Low Complexity: 1 Low  Sameeha Rockefeller, OT Acute Rehab Services Office: [phone] 05/11/2020  Julien Girt 05/11/2020, 1:17 PM

## 2020-05-11 NOTE — Discharge Instructions (Addendum)
05/11/2020  Return to work: 4-6 weeks if applicable  YOU WILL NEED TO REMOVE THE WHITE HONEYCOMB DRESSING FROM YOUR INCISION 5-7 DAYS AFTER YOUR SURGERY AND YOU DO NOT NEED TO REAPPLY A NEW DRESSING. KEEP THE INCISION CLEAN AND DRY. YOU ARE FINE TO TAKE A SHOWER, BUT AVOID TUB BATHS.  YOU CAN RESUME YOUR XARELTO.  Activity: 1. Be up and out of the bed during the day.  Take a nap if needed.  You may walk up steps but be careful and use the hand rail.  Stair climbing will tire you more than you think, you may need to stop part way and rest.   2. No lifting or straining for 6 weeks.  3. No driving for 2 week(s) from surgery if you were cleared to drive before surgery.  Do not drive if you are taking narcotic pain medicine.  4. Shower daily.  Use soap and water on your incision and pat dry; don't rub.  No tub baths until cleared by your surgeon.   5. No sexual activity and nothing in the vagina for 8 weeks.  6. You may experience a small amount of clear drainage from your incision, which is normal.  If the drainage persists or increases, please call the office.  7. You may experience vaginal spotting after surgery or around the 6-8 week mark from surgery when the stitches at the top of the vagina begin to dissolve.  The spotting is normal but if you experience heavy bleeding, call our office.  8. Take Tylenol or ibuprofen first for pain and only use narcotic pain medication for severe pain not relieved by the Tylenol or Ibuprofen.  Monitor your Tylenol intake to a max of 4,000 mg.  Diet: 1. Low sodium Heart Healthy Diet is recommended.  2. It is safe to use a laxative, such as Miralax or Colace, if you have difficulty moving your bowels. You can take Sennakot at bedtime every evening to keep bowel movements regular and to prevent constipation.    Wound Care: 1. Keep clean and dry.  Shower daily.  Reasons to call the Doctor:  Fever - Oral temperature greater than 100.4 degrees  Fahrenheit  Foul-smelling vaginal discharge  Difficulty urinating  Nausea and vomiting  Increased pain at the site of the incision that is unrelieved with pain medicine.  Difficulty breathing with or without chest pain  New calf pain especially if only on one side  Sudden, continuing increased vaginal bleeding with or without clots.   Contacts: For questions or concerns you should contact:  Dr. Everitt Amber at [phone]  Joylene John, NP at [phone]  After Hours: call [phone] and have the GYN Oncologist paged/contacted

## 2020-05-12 ENCOUNTER — Other Ambulatory Visit: Payer: Medicare Other

## 2020-05-15 ENCOUNTER — Other Ambulatory Visit: Payer: Self-pay | Admitting: Hematology and Oncology

## 2020-05-17 NOTE — Addendum Note (Signed)
Addendum  created 05/17/20 0949 by Lucretia Kern, MD   Attestation recorded in Intraprocedure, Intraprocedure Attestations filed

## 2020-05-24 ENCOUNTER — Telehealth: Payer: Self-pay

## 2020-05-24 ENCOUNTER — Encounter: Payer: Self-pay | Admitting: Gynecologic Oncology

## 2020-05-24 DIAGNOSIS — K9189 Other postprocedural complications and disorders of digestive system: Secondary | ICD-10-CM | POA: Diagnosis not present

## 2020-05-24 DIAGNOSIS — Z7901 Long term (current) use of anticoagulants: Secondary | ICD-10-CM | POA: Diagnosis not present

## 2020-05-24 DIAGNOSIS — K219 Gastro-esophageal reflux disease without esophagitis: Secondary | ICD-10-CM | POA: Diagnosis not present

## 2020-05-24 DIAGNOSIS — Z9181 History of falling: Secondary | ICD-10-CM | POA: Diagnosis not present

## 2020-05-24 DIAGNOSIS — E039 Hypothyroidism, unspecified: Secondary | ICD-10-CM | POA: Diagnosis not present

## 2020-05-24 DIAGNOSIS — Z79899 Other long term (current) drug therapy: Secondary | ICD-10-CM | POA: Diagnosis not present

## 2020-05-24 DIAGNOSIS — Z79891 Long term (current) use of opiate analgesic: Secondary | ICD-10-CM | POA: Diagnosis not present

## 2020-05-24 DIAGNOSIS — M8589 Other specified disorders of bone density and structure, multiple sites: Secondary | ICD-10-CM | POA: Diagnosis not present

## 2020-05-24 DIAGNOSIS — M1990 Unspecified osteoarthritis, unspecified site: Secondary | ICD-10-CM | POA: Diagnosis not present

## 2020-05-24 DIAGNOSIS — T8132XD Disruption of internal operation (surgical) wound, not elsewhere classified, subsequent encounter: Secondary | ICD-10-CM | POA: Diagnosis not present

## 2020-05-24 DIAGNOSIS — I824Z2 Acute embolism and thrombosis of unspecified deep veins of left distal lower extremity: Secondary | ICD-10-CM | POA: Diagnosis not present

## 2020-05-25 ENCOUNTER — Inpatient Hospital Stay: Payer: Medicare Other | Attending: Gynecologic Oncology | Admitting: Gynecologic Oncology

## 2020-05-25 ENCOUNTER — Encounter: Payer: Self-pay | Admitting: Gynecologic Oncology

## 2020-05-25 ENCOUNTER — Ambulatory Visit (HOSPITAL_COMMUNITY): Payer: Medicare Other

## 2020-05-25 ENCOUNTER — Other Ambulatory Visit: Payer: Self-pay

## 2020-05-25 ENCOUNTER — Other Ambulatory Visit: Payer: Medicare Other

## 2020-05-25 VITALS — BP 147/70 | HR 115 | Temp 97.6°F | Resp 17 | Ht 62.0 in | Wt 123.8 lb

## 2020-05-25 DIAGNOSIS — C786 Secondary malignant neoplasm of retroperitoneum and peritoneum: Secondary | ICD-10-CM | POA: Diagnosis not present

## 2020-05-25 DIAGNOSIS — Z90710 Acquired absence of both cervix and uterus: Secondary | ICD-10-CM | POA: Insufficient documentation

## 2020-05-25 DIAGNOSIS — C541 Malignant neoplasm of endometrium: Secondary | ICD-10-CM | POA: Diagnosis not present

## 2020-05-25 DIAGNOSIS — Z853 Personal history of malignant neoplasm of breast: Secondary | ICD-10-CM | POA: Insufficient documentation

## 2020-05-25 DIAGNOSIS — D61818 Other pancytopenia: Secondary | ICD-10-CM | POA: Insufficient documentation

## 2020-05-25 DIAGNOSIS — I824Z2 Acute embolism and thrombosis of unspecified deep veins of left distal lower extremity: Secondary | ICD-10-CM | POA: Diagnosis not present

## 2020-05-25 DIAGNOSIS — Z7901 Long term (current) use of anticoagulants: Secondary | ICD-10-CM | POA: Diagnosis not present

## 2020-05-25 DIAGNOSIS — Z79899 Other long term (current) drug therapy: Secondary | ICD-10-CM | POA: Diagnosis not present

## 2020-05-25 DIAGNOSIS — Z9221 Personal history of antineoplastic chemotherapy: Secondary | ICD-10-CM | POA: Insufficient documentation

## 2020-05-25 DIAGNOSIS — Z90722 Acquired absence of ovaries, bilateral: Secondary | ICD-10-CM

## 2020-05-25 DIAGNOSIS — T8131XD Disruption of external operation (surgical) wound, not elsewhere classified, subsequent encounter: Secondary | ICD-10-CM

## 2020-05-25 NOTE — Patient Instructions (Signed)
Please return to see Dr Bertis Ruddy as scheduled to determine if you have completed your course of therapy.  If so, Dr Andrey Farmer will see you again in 3 months for a cancer follow-up visit.  You are healing well from your surgery. You are cleared to return to all activity (eg heavy lifting) on January 17th. Please avoid placement of anything (including sex) in the vagina.

## 2020-05-25 NOTE — Progress Notes (Signed)
Gynecologic Oncology Follow-up Visit.  Chief Complaint:  Chief Complaint  Patient presents with  . Endometrial cancer ([place])  . vaginal cuff dehsicence    Assessment/Plan: [name] is a 70 y.o with a history of recurrent grade 3 endometrioid endometrial cancer. MSI stable/MMR normal. Recurrence diagnosed 12/08/19 (peritoneal carcinomatosis, lymphadenopathy, pelvic masses). 2 weeks s/p vaginal cuff dehiscence (spontaneous on 05/06/20 after cystoscopy for ureteral obstruction).   I am recommending continuing salvage chemotherapy with platinum and taxane until she has completed her full course (minimum 6 cycles). She has had a good clinical response based on my operative findings and pre-op CT imaging.   I will follow-up with her for surveillance at 3 months once she has completed therapy.   HPI: [name] is a 70 y.o.  G5 P3 with a history of right breast cancer who received adjuvant tamoxifen for 10 years, 2000 62,016.[name]  She reports vaginal spotting with pain that began on December 07, 2018.  A transvaginal ultrasound was notable for a uterus measuring 8.3 x 4.4 x 5.1 cm with an endometrial stripe of 1.53 cm.  Neither ovary was visualized.  An endometrial biopsy was obtained on December 26, 2018 and returned with a grade 3 endometrioid adenocarcinoma.    On 01/16/19 she underwent robotic assisted total hysterectomy, BSO, SLN biopsy. Surgery was uncomplicated. Final pathology revealed a FIGO grade 3 endometrioid endometrial adenocarcinoma. There was 22m of 287mmyometrial invasion (inner half) with no LVSI, no cervical or adnexal involvement and negative bilateral SLN's. MSI stable/MMR normal.   She was diagnosed with stage IA grade 3 endometrioid adenocarcinoma and, in accordance with NCCN guidelines, vaginal brachytherapy was recommended. She received vaginal brachytherapy with Dr.Kinard between the dates October 15 through 04/03/2019.  She received a total of 30 Gray in 5  fractions of 6 Gray.  She tolerated treatments well with no toxicities or delays.  The patient presented on 12/08/19 for   routine scheduled surveillance but reported severe pelvic pain since June 2021.  She reported that these are worse with micturition and passage of bowel movements.  Her symptoms particularly her pain was severe.  She did endorse left lower extremity edema since June 2021. Physical examination at that visit confirmed tenderness in the abdomen on deep palpation and a palpable mass in the right pelvis on pelvic examination.   She underwent a CT abd/pelvis on 12/15/19 to evaluate the pain and concerning examination findings and this confirmed recurrence of the endometrial cancer: a soft tissue nodule measuring 3.4x2.6cm in the right hemipelvis, left pelvic sidewall iliac lymph node conglomerate mass measuring 2.8cm, ascites, carcinomatosis and omental nodularity, ventral peritoneal nodules, moderate right hydroureter proximal to an obstruction at the right pelvic side wall soft tissue mass.   She was recommended to receive salvage chemotherapy with platinum and taxane.   Interval Hx:  She received adjuvant carboplatin and paclitaxel chemotherapy between 12/31/19 and 04/13/20. She had undergone cystoscopic right stent placement with Dr BoAlinda Moneyn 12/29/19 and had stent removal and replacement on 05/06/20. She reported a sensation of bulge and pain after voiding in the toilet after that procedure. Her pain continued at home and she presented back to the ER after that discharge with presentation of small bowel evisceration through her vaginal cuff. She was emergently taken to the operating room in the morning of 05/07/20 and an exploratory laparotomy was performed with reduction of the evisceration. Her vaginal cuff was closed in multiple layers of PDS suture.   Preoperative  scans that were performed on 05/07/20 showed the vaginal cuff evisceration, but no measurable disease, and the right  pelvic lymph node had decreased in size.   Since discharge she has done well, she continues to work with PT at home.   Review of Systems:  Constitutional  Feels well,   Cardiovascular  No chest pain, shortness of breath, or edema  Pulmonary  No cough or wheeze.  Gastro Intestinal  No pain Genito Urinary  No bleeding or discharge Neurologic  No weakness, numbness, change in gait,  Psychology  No depression, anxiety, insomnia.    Current Meds:  Outpatient Encounter Medications as of 05/25/2020  Medication Sig  . acetaminophen (TYLENOL) 500 MG tablet Take 500 mg by mouth every 6 (six) hours as needed for moderate pain.   [name] alendronate (FOSAMAX) 70 MG tablet Take 70 mg by mouth once a week.   . Calcium Carb-Cholecalciferol (CALCIUM 600+D3) 600-200 MG-UNIT TABS Take 1 tablet by mouth daily.   . Capsaicin (ARTHRITIS PAIN RELIEF EX) Apply 1 application topically daily as needed (Arthritis pain).  . fluticasone (FLONASE) 50 MCG/ACT nasal spray Place 1 spray into both nostrils daily as needed for allergies or rhinitis.   . hydrocortisone cream 0.5 % Apply 1 application topically daily as needed for itching.  . levothyroxine (SYNTHROID) 88 MCG tablet Take 88 mcg by mouth every morning.  . loratadine (CLARITIN) 10 MG tablet Take 10 mg by mouth daily.  . naphazoline-pheniramine (ALLERGY EYE) 0.025-0.3 % ophthalmic solution Place 1 drop into both eyes daily as needed for allergies.  [name] ondansetron (ZOFRAN) 8 MG tablet Take 1 tablet (8 mg total) by mouth every 8 (eight) hours as needed. Start on the third day after chemotherapy.  [name] oxyCODONE (OXY IR/ROXICODONE) 5 MG immediate release tablet Take 1 tablet (5 mg total) by mouth every 4 (four) hours as needed for severe pain. For AFTER surgery, do not take and drive  . prochlorperazine (COMPAZINE) 10 MG tablet Take 1 tablet (10 mg total) by mouth every 6 (six) hours as needed (Nausea or vomiting).  [name] senna-docusate (SENOKOT-S) 8.6-50 MG tablet Take  2 tablets by mouth at bedtime. For AFTER surgery to prevent constipation, do not take if having diarrhea  . XARELTO 20 MG TABS tablet TAKE 1 TABLET(20 MG) BY MOUTH DAILY WITH SUPPER  . dexamethasone (DECADRON) 4 MG tablet Take 2 tabs at the night before and 2 tab the morning of chemotherapy, every 3 weeks, by mouth x 6 cycles (Patient not taking: No sig reported)   No facility-administered encounter medications on file as of 05/25/2020.    Allergy:  Allergies  Allergen Reactions  . Gadolinium Nausea And Vomiting     Desc: Pt developed nausea and vomiting after receiving 17cc Multihance. Came back for repeat study and did fine with Magnevist. [name], Onset Date: [id number]   . Other Itching    Powdered gloves    Social Hx:   Social History   Socioeconomic History  . Marital status: Widowed    Spouse name: Not on file  . Number of children: 3  . Years of education: Not on file  . Highest education level: Not on file  Occupational History  . Occupation: retired  Tobacco Use  . Smoking status: Never Smoker  . Smokeless tobacco: Never Used  Vaping Use  . Vaping Use: Never used  Substance and Sexual Activity  . Alcohol use: Never  . Drug use: Never  . Sexual activity: Yes  Birth control/protection: Surgical  Other Topics Concern  . Not on file  Social History Narrative  . Not on file   Social Determinants of Health   Financial Resource Strain: Not on file  Food Insecurity: Not on file  Transportation Needs: Not on file  Physical Activity: Not on file  Stress: Not on file  Social Connections: Not on file  Intimate Partner Violence: Not on file    Past Surgical Hx:  Past Surgical History:  Procedure Laterality Date  . BACK SURGERY    . BREAST LUMPECTOMY Right 2006   With Lymphnode discetion  . CYSTOSCOPY WITH STENT PLACEMENT Right 12/29/2019   Procedure: CYSTOSCOPY WITH STENT PLACEMENT;  Surgeon: Raynelle Bring, MD;  Location: [facility];  Service: Urology;   Laterality: Right;  . CYSTOSCOPY WITH STENT PLACEMENT Right 05/06/2020   Procedure: CYSTOSCOPY WITH STENT CHANGE;  Surgeon: Raynelle Bring, MD;  Location: [facility];  Service: Urology;  Laterality: Right;  . LAPAROTOMY N/A 05/07/2020   Procedure: EXPLORATORY LAPAROTOMY AND REPAIR OF VAGINAL CUFF;  Surgeon: Everitt Amber, MD;  Location: [facility];  Service: Gynecology;  Laterality: N/A;  . NM I-131 ABLATION W/THYROGEN ([place] HX)  1970  . OTHER SURGICAL HISTORY  1991   HNP with Fusion   . ROBOTIC ASSISTED TOTAL HYSTERECTOMY WITH BILATERAL SALPINGO OOPHERECTOMY Bilateral 01/16/2019   Procedure: XI ROBOTIC ASSISTED TOTAL HYSTERECTOMY WITH BILATERAL SALPINGO OOPHORECTOMY;  Surgeon: Everitt Amber, MD;  Location: [facility];  Service: Gynecology;  Laterality: Bilateral;  . SENTINEL NODE BIOPSY N/A 01/16/2019   Procedure: SENTINEL LYMPH NODE BIOPSY;  Surgeon: Everitt Amber, MD;  Location: [facility];  Service: Gynecology;  Laterality: N/A;  . TUBAL LIGATION Bilateral 1975    Past Medical Hx:  Past Medical History:  Diagnosis Date  . Anemia   . Breast cancer ([place])    right/2006  . Bruises easily   . Endometrial cancer ([place])   . Family history of breast cancer   . GERD (gastroesophageal reflux disease)   . History of chemotherapy   . History of kidney infection   . History of radiation therapy   . Hypothyroidism   . Osteoarthritis   . Osteopenia   . Seasonal allergies   . Ureteral obstruction   Patient is unaware of the stage of the breast cancer but states that she received lumpectomy sentinel lymph node dissection adjuvant radiation and chemotherapy followed by tamoxifen treatment for approximately 10 years  Past Gynecological History: Gravida 5 para 3 menopause occurred in the 35s denies any history of abnormal Pap and denies use of any hormonal agents for birth control  family Hx:  Family History  Problem Relation Age of Onset  . Hypertension Mother   . Heart disease Mother   . Cirrhosis Father   .  Stroke Maternal Uncle   . Breast cancer Half-Sister        bilateral breast cancer dx in her 44s; pat 1/2 sister  . Lung cancer Half-Sister        smoker; pat 1/2 sibling  . Colon cancer Neg Hx   . Liver disease Neg Hx   . Colon polyps Neg Hx     Vitals:  Blood pressure (!) 147/70, pulse (!) 115, temperature 97.6 F (36.4 C), temperature source Tympanic, resp. rate 17, height _0  (1.575 m), weight 123 lb 12.8 oz (56.2 kg), SpO2 100 %.  Physical Exam: WD in NAD Thin. Frail appearing.  Neck  Supple NROM, without any enlargements.  Lymph Node Survey No  cervical supraclavicular or inguinal adenopathy Cardiovascular  Well perfused peripheries Lungs  No increased WOB Skin  No rash/lesions/breakdown  Psychiatry  Alert and oriented to person, place, and time  Abdomen  Normoactive bowel sounds, abdomen soft, non-tender and thin without evidence of hernia. Well healing abdominal incision. Back No CVA tenderness Genito Urinary  Vulva/vagina: Normal external female genitalia.  No lesions. No discharge or bleeding.  Bladder/urethra:  No lesions or masses, well supported bladder  Vagina: vaginal cuff atrophic, in tact, suture material present, no palpable gaps. No bleeding, no lesions.  Rectal  deferred Extremities  No bilateral cyanosis, clubbing or edema.    Thereasa Solo, MD, 05/25/2020, 3:16 PM

## 2020-05-26 ENCOUNTER — Telehealth: Payer: Self-pay | Admitting: Hematology and Oncology

## 2020-05-26 ENCOUNTER — Inpatient Hospital Stay: Payer: Medicare Other | Admitting: Hematology and Oncology

## 2020-05-26 ENCOUNTER — Ambulatory Visit: Payer: Medicare Other | Admitting: Hematology and Oncology

## 2020-05-26 ENCOUNTER — Other Ambulatory Visit: Payer: Self-pay

## 2020-05-26 VITALS — BP 132/61 | HR 102 | Temp 98.1°F | Resp 18 | Ht 62.0 in | Wt 126.0 lb

## 2020-05-26 DIAGNOSIS — I824Z2 Acute embolism and thrombosis of unspecified deep veins of left distal lower extremity: Secondary | ICD-10-CM | POA: Diagnosis not present

## 2020-05-26 DIAGNOSIS — Z853 Personal history of malignant neoplasm of breast: Secondary | ICD-10-CM | POA: Diagnosis not present

## 2020-05-26 DIAGNOSIS — Z9221 Personal history of antineoplastic chemotherapy: Secondary | ICD-10-CM | POA: Diagnosis not present

## 2020-05-26 DIAGNOSIS — D61818 Other pancytopenia: Secondary | ICD-10-CM | POA: Diagnosis not present

## 2020-05-26 DIAGNOSIS — C541 Malignant neoplasm of endometrium: Secondary | ICD-10-CM | POA: Diagnosis not present

## 2020-05-26 DIAGNOSIS — Z79899 Other long term (current) drug therapy: Secondary | ICD-10-CM | POA: Diagnosis not present

## 2020-05-26 DIAGNOSIS — C786 Secondary malignant neoplasm of retroperitoneum and peritoneum: Secondary | ICD-10-CM | POA: Diagnosis not present

## 2020-05-26 DIAGNOSIS — Z7901 Long term (current) use of anticoagulants: Secondary | ICD-10-CM | POA: Diagnosis not present

## 2020-05-26 NOTE — Telephone Encounter (Signed)
Scheduled appts per 1/5 sch msg. Gave pt a print out of AVS.  

## 2020-05-27 ENCOUNTER — Encounter: Payer: Self-pay | Admitting: Hematology and Oncology

## 2020-05-27 DIAGNOSIS — K219 Gastro-esophageal reflux disease without esophagitis: Secondary | ICD-10-CM | POA: Diagnosis not present

## 2020-05-27 DIAGNOSIS — K9189 Other postprocedural complications and disorders of digestive system: Secondary | ICD-10-CM | POA: Diagnosis not present

## 2020-05-27 DIAGNOSIS — I824Z2 Acute embolism and thrombosis of unspecified deep veins of left distal lower extremity: Secondary | ICD-10-CM | POA: Diagnosis not present

## 2020-05-27 DIAGNOSIS — E039 Hypothyroidism, unspecified: Secondary | ICD-10-CM | POA: Diagnosis not present

## 2020-05-27 DIAGNOSIS — Z79899 Other long term (current) drug therapy: Secondary | ICD-10-CM | POA: Diagnosis not present

## 2020-05-27 DIAGNOSIS — T8132XD Disruption of internal operation (surgical) wound, not elsewhere classified, subsequent encounter: Secondary | ICD-10-CM | POA: Diagnosis not present

## 2020-05-27 DIAGNOSIS — M8589 Other specified disorders of bone density and structure, multiple sites: Secondary | ICD-10-CM | POA: Diagnosis not present

## 2020-05-27 DIAGNOSIS — Z79891 Long term (current) use of opiate analgesic: Secondary | ICD-10-CM | POA: Diagnosis not present

## 2020-05-27 DIAGNOSIS — M1990 Unspecified osteoarthritis, unspecified site: Secondary | ICD-10-CM | POA: Diagnosis not present

## 2020-05-27 DIAGNOSIS — Z9181 History of falling: Secondary | ICD-10-CM | POA: Diagnosis not present

## 2020-05-27 DIAGNOSIS — Z7901 Long term (current) use of anticoagulants: Secondary | ICD-10-CM | POA: Diagnosis not present

## 2020-05-27 NOTE — Assessment & Plan Note (Signed)
Her recent imaging study showed regression of all peritoneal disease and lymphadenopathy She has completed 6 cycles of carboplatin and paclitaxel I reviewed the plan of care with the patient and her daughter For now, we are in favor of for observation with active surveillance I plan to see her back again in March with plan to repeat CT imaging and blood work If she have residual disease, we will resume systemic treatment She is in agreement

## 2020-05-27 NOTE — Assessment & Plan Note (Signed)
Recent pancytopenia is likely related to recent chemotherapy and surgery She is not symptomatic Observe only for now

## 2020-05-27 NOTE — Assessment & Plan Note (Signed)
Due to her debility, I recommend she continue on anticoagulation therapy at least until March So far, she is doing well with no recent bleeding complications

## 2020-05-27 NOTE — Progress Notes (Signed)
[place] OFFICE PROGRESS NOTE  Patient Care Team: [name], MD as PCP - General (Family Medicine)  ASSESSMENT & PLAN:  Endometrial cancer [facility]) Her recent imaging study showed regression of all peritoneal disease and lymphadenopathy She has completed 6 cycles of carboplatin and paclitaxel I reviewed the plan of care with the patient and her daughter For now, we are in favor of for observation with active surveillance I plan to see her back again in March with plan to repeat CT imaging and blood work If she have residual disease, we will resume systemic treatment She is in agreement  Pancytopenia, acquired [facility]) Recent pancytopenia is likely related to recent chemotherapy and surgery She is not symptomatic Observe only for now  Acute deep vein thrombosis (DVT) of distal vein of left lower extremity ([place]) Due to her debility, I recommend she continue on anticoagulation therapy at least until March So far, she is doing well with no recent bleeding complications   Orders Placed This Encounter  Procedures  . CT ABDOMEN PELVIS W CONTRAST    Standing Status:   Future    Standing Expiration Date:   05/26/2021    Order Specific Question:   If indicated for the ordered procedure, I authorize the administration of contrast media per Radiology protocol    Answer:   Yes    Order Specific Question:   Preferred imaging location?    Answer:   [facility]    Order Specific Question:   Radiology Contrast Protocol - do NOT remove file path    Answer:   [email or web address]    All questions were answered. The patient knows to call the clinic with any problems, questions or concerns. The total time spent in the appointment was 30 minutes encounter with patients including review of chart and various tests results, discussions about plan of care and coordination of care plan   [name], MD 05/27/2020 8:52 AM  INTERVAL  HISTORY: Please see below for problem oriented charting. She returns today for further follow-up Since last time I saw her, she was supposed to get CT imaging done last month She went for urology procedure and then shortly after that complaining of severe abdominal pain and developed vaginal wound dehiscence requiring emergency surgery Prior to surgery, she had CT imaging done which showed regression of the her disease as well as lymphadenopathy She is recovering well after her surgery I also spoke with her daughter over the telephone throughout the meeting She is eating well She denies pain She has no bleeding complications from anticoagulation therapy  SUMMARY OF ONCOLOGIC HISTORY: Oncology History Overview Note  ER/PR negative, MSI stable Genetic testing reported out on January 07, 2020 through the Common hereditary cancer panel found no pathogenic mutations   Breast cancer, right breast ([place])  03/13/2014 Initial Diagnosis   Breast cancer, right breast ([place])   01/07/2020 Genetic Testing   Negative genetic testing.  RAD50 c.1556C>T VUS identified.  The Common Hereditary Gene Panel offered by [facility] includes sequencing and/or deletion duplication testing of the following 48 genes: APC, ATM, AXIN2, BARD1, BMPR1A, BRCA1, BRCA2, BRIP1, CDH1, CDK4, CDKN2A (p14ARF), CDKN2A (p16INK4a), CHEK2, CTNNA1, DICER1, EPCAM (Deletion/duplication testing only), GREM1 (promoter region deletion/duplication testing only), KIT, MEN1, MLH1, MSH2, MSH3, MSH6, MUTYH, NBN, NF1, NHTL1, PALB2, PDGFRA, PMS2, POLD1, POLE, PTEN, RAD50, RAD51C, RAD51D, RNF43, SDHB, SDHC, SDHD, SMAD4, SMARCA4. STK11, TP53, TSC1, TSC2, and VHL.  The following genes were evaluated for sequence changes only: SDHA and HOXB13 c.251G>A variant  only. The report date is January 07, 2020.   Endometrial cancer ([place])  01/08/2019 Imaging   Hypermetabolic activity within central uterus, consistent with known endometrial carcinoma.   No evidence of  local or distant metastatic disease.     01/16/2019 Pathology Results   1. Lymph node, sentinel, biopsy, left obturator - NO CARCINOMA IDENTIFIED IN ONE LYMPH NODE (0/1) - SEE COMMENT 2. Lymph node, sentinel, biopsy, right obturator - NO CARCINOMA IDENTIFIED IN ONE LYMPH NODE (0/1) - SEE COMMENT 3. Uterus +/- tubes/ovaries, neoplastic, cervix, bilateral fallopian tubes and ovaries UTERUS: - ENDOMETRIOID CARCINOMA, FIGO GRADE 3, CONFINED TO THE UTERUS - SEE ONCOLOGY TABLE BELOW CERVIX: - NO CARCINOMA IDENTIFIED BILATERAL OVARIES: - NO CARCINOMA IDENTIFIED BILATERAL FALLOPIAN TUBES: - BENIGN PARATUBAL CYSTS (RIGHT) - NO CARCINOMA IDENTIFIED Microscopic Comment 1. and 2. Cytokeratin AE1/3 was performed on the sentinel lymph nodes to exclude micrometastasis. There is no evidence of metastatic carcinoma by immunohistochemistry. 3. UTERUS, CARCINOMA OR CARCINOSARCOMA Procedure: Hysterectomy and bilateral salpingo-oophorectomy Histologic type: Endometrioid carcinoma Histologic Grade: FIGO Grade 3 Myometrial invasion: Depth of invasion: 4 mm 1 ofMyometrial thickness: 27 mm Uterine Serosa Involvement: Not identified Cervical stromal involvement: Not identified Extent of involvement of other organs: N/A Lymphovascular invasion: Not identified Regional Lymph Nodes: Examined: 2 Sentinel 0 Non-sentinel 2 Total Lymph nodes with metastasis: N/A Isolated tumor cells (< 0.2 mm): 0 Micrometastasis: (> 0.2 mm and < 2.0 mm): 0 Macrometastasis: (> 2.0 mm): 0 Extracapsular extension: N/A Tumor block for ancillary studies: 3C MMR / MSI testing: Pending Pathologic Stage Classification (pTNM, AJCC 8th edition): pT1a, pN0 FIGO Stage: FIGO Ia   01/16/2019 Surgery   Surgeon: Donaciano Eva  Pre-operative Diagnosis: endometrial cancer grade 3    Operation: Robotic-assisted laparoscopic total hysterectomy with bilateral salpingoophorectomy, SLN biopsy  Operative Findings:  : 9cm uterus  (mildly broadened and bulky), normal appearing fallopian tubes and ovaries.       12/15/2019 Imaging   1. Status post hysterectomy. Soft tissue nodule in the right hemipelvis measuring 3.4 x 2.6 cm. Soft tissue nodule or left pelvic sidewall/iliac lymph node conglomerate measuring 2.8 x 2.1 cm. Findings are consistent with recurrent/metastatic disease. 2. Moderate volume ascites throughout the abdomen and pelvis with fine peritoneal nodularity noted in the pelvis and new omental or ventral peritoneal soft tissue thickening, consistent with peritoneal metastatic disease. 3. Moderate right hydronephrosis and hydroureter, the distal right ureter obstructed by right pelvic soft tissue mass noted above. 4. Aortic Atherosclerosis (ICD10-I70.0).   12/22/2019 Cancer Staging   Staging form: Corpus Uteri - Carcinoma and Carcinosarcoma, AJCC 8th Edition - Clinical stage from 12/22/2019: FIGO Stage IVB (rcT1a, cN0, cM1) - Signed by [name], MD on 12/22/2019   12/24/2019 Imaging   CT chest 1. No evidence of metastatic disease in the chest. 2. Stable 3 mm pulmonary nodule of the peripheral right lower lobe, almost certainly incidental and benign. Attention on follow-up.  3. Scarring of the left lung base. 4. Aortic Atherosclerosis (ICD10-I70.0).   12/29/2019 Procedure   Preoperative diagnosis:  1. Right ureteral obstruction 2. Metastatic endometrial cancer   Postoperative diagnosis:  1. Right ureteral obstruction 2. Metastatic endometrial cancer   Procedure:   1. Cystoscopy 2. Right ureteral stent placement (6 x 24-no string)  3. Right retrograde pyelography with interpretation   Surgeon: Pryor Curia. M.D.    Intraoperative findings: Right retrograde pyelography was performed using a 6 French ureteral catheter and Omnipaque contrast.  This demonstrated severe deviation of the distal  ureter laterally around the patient's pelvic mass.  The distal ureter was clearly compressed extrinsically  without intrinsic filling defects.  The ureter was then significantly dilated above the pelvis with some tortuosity extending into a dilated renal collecting system.   12/31/2019 - 04/13/2020 Chemotherapy   The patient had carboplatin and taxol for chemotherapy treatment.     01/08/2020 Genetic Testing   Genetic testing reported out on January 07, 2020 through the Common hereditary cancer panel found no pathogenic mutations   03/01/2020 Imaging   1. Overall significant improvement, with reduced size of the pelvic adenopathy, reduced conspicuity of prior peritoneal tumor deposits, and resolution of the prior ascites. 2. Right double-J ureteral stent in place with resolution of the prior right hydronephrosis and right hydroureter. 3. Heterogeneous density in the left external iliac and common femoral vein could be from mixing of opacified and unopacified venous blood versus DVT. Consider left lower extremity Doppler venous ultrasound for further workup. 4. Other imaging findings of potential clinical significance: Mild cardiomegaly. Pancreas divisum. Lumbar spondylosis and degenerative disc disease causing multilevel impingement. Subglandular nodularity in the right breast for example on image 6 of series 2 was not hypermetabolic on prior PET-CT of 01/08/2019. 5. Emphysema and aortic atherosclerosis.     05/07/2020 Imaging   1. Small-bowel loops extend inferior to the pelvic floor along the expected course of the vagina. This is compatible with reported clinical history of vaginal cuff dehiscence. 2. Interval decrease in size of the left pelvic sidewall lymph node and soft tissue in the right pelvis. No new or progressive findings on today's exam. 3. Mild right hydronephrosis with double-J internal ureteral stent in situ. 4. Small volume intraperitoneal free fluid. 5. Aortic Atherosclerosis (ICD10-I70.0) and Emphysema (ICD10-J43.9).     05/07/2020 Surgery   Preoperative Diagnosis: vaginal cuff  dehiscence with evisceration of small intestine, recurrent endometrial cancer on chemotherapy Surgeon: Thereasa Solo, MD.   Assistant Surgeon: none   Specimens: vaginal cuff    Operative Findings: 30cm of small intestine (ileum) eviscerated through vaginal introitus, engorged but pink. Bowel with palpable pulses in mesentery and viable appearing serosa at completion of procedure. Gaping vaginal cuff, no evidence of laceration (the cuff was smooth, non-bleeding edges, did not appear to be a traumatic dehiscence. No gross intraperitoneal disease palpable.      REVIEW OF SYSTEMS:   Constitutional: Denies fevers, chills or abnormal weight loss Eyes: Denies blurriness of vision Ears, nose, mouth, throat, and face: Denies mucositis or sore throat Respiratory: Denies cough, dyspnea or wheezes Cardiovascular: Denies palpitation, chest discomfort or lower extremity swelling Gastrointestinal:  Denies nausea, heartburn or change in bowel habits Skin: Denies abnormal skin rashes Lymphatics: Denies new lymphadenopathy or easy bruising Neurological:Denies numbness, tingling or new weaknesses Behavioral/Psych: Mood is stable, no new changes  All other systems were reviewed with the patient and are negative.  I have reviewed the past medical history, past surgical history, social history and family history with the patient and they are unchanged from previous note.  ALLERGIES:  is allergic to gadolinium and other.  MEDICATIONS:  Current Outpatient Medications  Medication Sig Dispense Refill  . acetaminophen (TYLENOL) 500 MG tablet Take 500 mg by mouth every 6 (six) hours as needed for moderate pain.     [name] alendronate (FOSAMAX) 70 MG tablet Take 70 mg by mouth once a week.     . Calcium Carb-Cholecalciferol (CALCIUM 600+D3) 600-200 MG-UNIT TABS Take 1 tablet by mouth daily.     . Capsaicin (  ARTHRITIS PAIN RELIEF EX) Apply 1 application topically daily as needed (Arthritis pain).    . fluticasone  (FLONASE) 50 MCG/ACT nasal spray Place 1 spray into both nostrils daily as needed for allergies or rhinitis.     . hydrocortisone cream 0.5 % Apply 1 application topically daily as needed for itching.    . levothyroxine (SYNTHROID) 88 MCG tablet Take 88 mcg by mouth every morning.    . loratadine (CLARITIN) 10 MG tablet Take 10 mg by mouth daily.    . naphazoline-pheniramine (ALLERGY EYE) 0.025-0.3 % ophthalmic solution Place 1 drop into both eyes daily as needed for allergies.    [name] ondansetron (ZOFRAN) 8 MG tablet Take 1 tablet (8 mg total) by mouth every 8 (eight) hours as needed. Start on the third day after chemotherapy. 30 tablet 1  . oxyCODONE (OXY IR/ROXICODONE) 5 MG immediate release tablet Take 1 tablet (5 mg total) by mouth every 4 (four) hours as needed for severe pain. For AFTER surgery, do not take and drive 15 tablet 0  . prochlorperazine (COMPAZINE) 10 MG tablet Take 1 tablet (10 mg total) by mouth every 6 (six) hours as needed (Nausea or vomiting). 30 tablet 1  . senna-docusate (SENOKOT-S) 8.6-50 MG tablet Take 2 tablets by mouth at bedtime. For AFTER surgery to prevent constipation, do not take if having diarrhea 30 tablet 0  . XARELTO 20 MG TABS tablet TAKE 1 TABLET(20 MG) BY MOUTH DAILY WITH SUPPER 30 tablet 1   No current facility-administered medications for this visit.    PHYSICAL EXAMINATION: ECOG PERFORMANCE STATUS: 2 - Symptomatic, <50% confined to bed  Vitals:   05/26/20 1319  BP: 132/61  Pulse: (!) 102  Resp: 18  Temp: 98.1 F (36.7 C)  SpO2: 100%   Filed Weights   05/26/20 1319  Weight: 126 lb (57.2 kg)    GENERAL:alert, no distress and comfortable Musculoskeletal:no cyanosis of digits and no clubbing  NEURO: alert & oriented x 3 with fluent speech, no focal motor/sensory deficits  LABORATORY DATA:  I have reviewed the data as listed    Component Value Date/Time   NA 143 05/08/2020 0517   NA 142 12/08/2014 1153   K 3.5 05/08/2020 0517   K 3.7  12/08/2014 1153   CL 106 05/08/2020 0517   CO2 27 05/08/2020 0517   CO2 29 12/08/2014 1153   GLUCOSE 116 (H) 05/08/2020 0517   GLUCOSE 84 12/08/2014 1153   BUN 5 (L) 05/08/2020 0517   BUN 8.6 12/08/2014 1153   CREATININE 0.62 05/08/2020 0517   CREATININE 0.78 04/13/2020 0922   CREATININE 0.7 12/08/2014 1153   CALCIUM 8.7 (L) 05/08/2020 0517   CALCIUM 9.3 12/08/2014 1153   PROT 6.8 05/07/2020 1347   PROT 7.2 12/08/2014 1153   ALBUMIN 3.3 (L) 05/07/2020 1347   ALBUMIN 3.8 12/08/2014 1153   AST 20 05/07/2020 1347   AST 19 04/13/2020 0922   AST 21 12/08/2014 1153   ALT 8 05/07/2020 1347   ALT 7 04/13/2020 0922   ALT 9 12/08/2014 1153   ALKPHOS 49 05/07/2020 1347   ALKPHOS 63 12/08/2014 1153   BILITOT 0.4 05/07/2020 1347   BILITOT 0.3 04/13/2020 0922   BILITOT 0.56 12/08/2014 1153   GFRNONAA >60 05/08/2020 0517   GFRNONAA >60 04/13/2020 0922   GFRAA >60 02/10/2020 0855    No results found for: SPEP, UPEP  Lab Results  Component Value Date   WBC 6.4 05/08/2020   NEUTROABS 6.7 05/06/2020  HGB 9.6 (L) 05/08/2020   HCT 30.0 (L) 05/08/2020   MCV 107.1 (H) 05/08/2020   PLT 117 (L) 05/08/2020      Chemistry      Component Value Date/Time   NA 143 05/08/2020 0517   NA 142 12/08/2014 1153   K 3.5 05/08/2020 0517   K 3.7 12/08/2014 1153   CL 106 05/08/2020 0517   CO2 27 05/08/2020 0517   CO2 29 12/08/2014 1153   BUN 5 (L) 05/08/2020 0517   BUN 8.6 12/08/2014 1153   CREATININE 0.62 05/08/2020 0517   CREATININE 0.78 04/13/2020 0922   CREATININE 0.7 12/08/2014 1153      Component Value Date/Time   CALCIUM 8.7 (L) 05/08/2020 0517   CALCIUM 9.3 12/08/2014 1153   ALKPHOS 49 05/07/2020 1347   ALKPHOS 63 12/08/2014 1153   AST 20 05/07/2020 1347   AST 19 04/13/2020 0922   AST 21 12/08/2014 1153   ALT 8 05/07/2020 1347   ALT 7 04/13/2020 0922   ALT 9 12/08/2014 1153   BILITOT 0.4 05/07/2020 1347   BILITOT 0.3 04/13/2020 0922   BILITOT 0.56 12/08/2014 1153        RADIOGRAPHIC STUDIES: I have personally reviewed the radiological images as listed and agreed with the findings in the report. CT ABDOMEN PELVIS W CONTRAST  Result Date: 05/07/2020 CLINICAL DATA:  Endometrial cancer.  Pelvic floor prolapse. EXAM: CT ABDOMEN AND PELVIS WITH CONTRAST TECHNIQUE: Multidetector CT imaging of the abdomen and pelvis was performed using the standard protocol following bolus administration of intravenous contrast. CONTRAST:  167mL OMNIPAQUE IOHEXOL 300 MG/ML  SOLN COMPARISON:  03/01/2020 FINDINGS: Lower chest: Centrilobular emphsyema noted. Hepatobiliary: No suspicious focal abnormality within the liver parenchyma. There is no evidence for gallstones, gallbladder wall thickening, or pericholecystic fluid. No intrahepatic or extrahepatic biliary dilation. Pancreas: No focal mass lesion. No dilatation of the main duct. No intraparenchymal cyst. No peripancreatic edema. Spleen: No splenomegaly. No focal mass lesion. Adrenals/Urinary Tract: No adrenal nodule or mass. Left kidney unremarkable. Mild right hydronephrosis noted with double-J internal ureteral stent in situ. Bladder is distended. Gas in the bladder lumen presumably from recent instrumentation. Stomach/Bowel: Stomach is unremarkable. No gastric wall thickening. No evidence of outlet obstruction. Duodenum is normally positioned as is the ligament of Treitz. No small bowel wall thickening. No small bowel dilatation. Relatively large volume of small-bowel projects inferior to the pelvic floor along the expected course of the vagina. This is compatible with reported clinical history of vaginal cuff dehiscence. No small bowel wall thickening or pneumatosis evident at this time. No gross colonic mass. No colonic wall thickening. Vascular/Lymphatic: There is abdominal aortic atherosclerosis without aneurysm. There is no gastrohepatic or hepatoduodenal ligament lymphadenopathy. No retroperitoneal or mesenteric lymphadenopathy. No  pelvic sidewall lymphadenopathy on today's exam. 11 mm short axis left pelvic sidewall lymph node measured previously is 7 mm short axis today on 59/2. Soft tissue in the right pelvis measured previously at 12 mm is now 7 mm on image 60/2 (immediately posteromedial to the ureteral stent). Reproductive: Uterus surgically absent. No evidence for adnexal mass. Other: Small volume intraperitoneal free fluid. Musculoskeletal: No worrisome lytic or sclerotic osseous abnormality. IMPRESSION: 1. Small-bowel loops extend inferior to the pelvic floor along the expected course of the vagina. This is compatible with reported clinical history of vaginal cuff dehiscence. 2. Interval decrease in size of the left pelvic sidewall lymph node and soft tissue in the right pelvis. No new or progressive findings on today's exam.  3. Mild right hydronephrosis with double-J internal ureteral stent in situ. 4. Small volume intraperitoneal free fluid. 5. Aortic Atherosclerosis (ICD10-I70.0) and Emphysema (ICD10-J43.9). Electronically Signed   By: Misty Stanley M.D.   On: 05/07/2020 05:11   DG C-Arm 1-60 Min-No Report  Result Date: 05/06/2020 Fluoroscopy was utilized by the requesting physician.  No radiographic interpretation.   MM 3D SCREEN BREAST BILATERAL  Result Date: 05/01/2020 CLINICAL DATA:  Screening. EXAM: DIGITAL SCREENING BILATERAL MAMMOGRAM WITH TOMO AND CAD COMPARISON:  Previous exam(s). ACR Breast Density Category c: The breast tissue is heterogeneously dense, which may obscure small masses. FINDINGS: In the right breast, a possible asymmetry warrants further evaluation. In the left breast, no findings suspicious for malignancy. Images were processed with CAD. IMPRESSION: Further evaluation is suggested for possible asymmetry in the right breast. RECOMMENDATION: Diagnostic mammogram and possibly ultrasound of the right breast. (Code:[id number]) The patient will be contacted regarding the findings, and additional imaging  will be scheduled. BI-RADS CATEGORY  0: Incomplete. Need additional imaging evaluation and/or prior mammograms for comparison. Electronically Signed   By: Dorise Bullion III M.D   On: 05/01/2020 09:08

## 2020-05-28 ENCOUNTER — Other Ambulatory Visit: Payer: Self-pay | Admitting: Family Medicine

## 2020-05-28 ENCOUNTER — Ambulatory Visit
Admit: 2020-05-28 | Discharge: 2020-05-28 | Disposition: A | Discharge status: Home / Self Care | Payer: Medicare Other | Attending: Family Medicine | Admitting: Family Medicine

## 2020-05-28 ENCOUNTER — Other Ambulatory Visit: Payer: Self-pay

## 2020-05-28 DIAGNOSIS — R928 Other abnormal and inconclusive findings on diagnostic imaging of breast: Secondary | ICD-10-CM

## 2020-05-28 DIAGNOSIS — N6312 Unspecified lump in the right breast, upper inner quadrant: Secondary | ICD-10-CM | POA: Diagnosis not present

## 2020-05-28 DIAGNOSIS — R922 Inconclusive mammogram: Secondary | ICD-10-CM | POA: Diagnosis not present

## 2020-06-09 NOTE — Telephone Encounter (Signed)
error 

## 2020-07-14 DIAGNOSIS — N390 Urinary tract infection, site not specified: Secondary | ICD-10-CM | POA: Diagnosis not present

## 2020-07-14 DIAGNOSIS — N131 Hydronephrosis with ureteral stricture, not elsewhere classified: Secondary | ICD-10-CM | POA: Diagnosis not present

## 2020-07-27 ENCOUNTER — Other Ambulatory Visit: Payer: Self-pay | Admitting: Hematology and Oncology

## 2020-08-05 ENCOUNTER — Inpatient Hospital Stay: Payer: Medicare Other | Attending: Gynecologic Oncology

## 2020-08-05 ENCOUNTER — Ambulatory Visit (HOSPITAL_COMMUNITY)
Admission: RE | Admit: 2020-08-05 | Discharge: 2020-08-05 | Disposition: A | Discharge status: Home / Self Care | Payer: Medicare Other | Source: Ambulatory Visit | Attending: Hematology and Oncology | Admitting: Hematology and Oncology

## 2020-08-05 ENCOUNTER — Encounter (HOSPITAL_COMMUNITY): Payer: Self-pay

## 2020-08-05 ENCOUNTER — Other Ambulatory Visit: Payer: Self-pay

## 2020-08-05 DIAGNOSIS — N131 Hydronephrosis with ureteral stricture, not elsewhere classified: Secondary | ICD-10-CM | POA: Diagnosis not present

## 2020-08-05 DIAGNOSIS — M5136 Other intervertebral disc degeneration, lumbar region: Secondary | ICD-10-CM | POA: Diagnosis not present

## 2020-08-05 DIAGNOSIS — Z9221 Personal history of antineoplastic chemotherapy: Secondary | ICD-10-CM | POA: Insufficient documentation

## 2020-08-05 DIAGNOSIS — C541 Malignant neoplasm of endometrium: Secondary | ICD-10-CM | POA: Insufficient documentation

## 2020-08-05 DIAGNOSIS — D539 Nutritional anemia, unspecified: Secondary | ICD-10-CM | POA: Insufficient documentation

## 2020-08-05 DIAGNOSIS — Z7189 Other specified counseling: Secondary | ICD-10-CM

## 2020-08-05 DIAGNOSIS — Z7901 Long term (current) use of anticoagulants: Secondary | ICD-10-CM | POA: Insufficient documentation

## 2020-08-05 DIAGNOSIS — I700 Atherosclerosis of aorta: Secondary | ICD-10-CM | POA: Diagnosis not present

## 2020-08-05 DIAGNOSIS — M7989 Other specified soft tissue disorders: Secondary | ICD-10-CM

## 2020-08-05 DIAGNOSIS — N1330 Unspecified hydronephrosis: Secondary | ICD-10-CM | POA: Diagnosis not present

## 2020-08-05 LAB — CBC WITH DIFFERENTIAL (CANCER CENTER ONLY)
Abs Immature Granulocytes: 0.01 10*3/uL (ref 0.00–0.07)
Basophils Absolute: 0 10*3/uL (ref 0.0–0.1)
Basophils Relative: 1 %
Eosinophils Absolute: 0 10*3/uL (ref 0.0–0.5)
Eosinophils Relative: 1 %
HCT: 33.2 % — ABNORMAL LOW (ref 36.0–46.0)
Hemoglobin: 10.8 g/dL — ABNORMAL LOW (ref 12.0–15.0)
Immature Granulocytes: 0 %
Lymphocytes Relative: 29 %
Lymphs Abs: 1.1 10*3/uL (ref 0.7–4.0)
MCH: 34.8 pg — ABNORMAL HIGH (ref 26.0–34.0)
MCHC: 32.5 g/dL (ref 30.0–36.0)
MCV: 107.1 fL — ABNORMAL HIGH (ref 80.0–100.0)
Monocytes Absolute: 0.3 10*3/uL (ref 0.1–1.0)
Monocytes Relative: 9 %
Neutro Abs: 2.2 10*3/uL (ref 1.7–7.7)
Neutrophils Relative %: 60 %
Platelet Count: 200 10*3/uL (ref 150–400)
RBC: 3.1 MIL/uL — ABNORMAL LOW (ref 3.87–5.11)
RDW: 11.9 % (ref 11.5–15.5)
WBC Count: 3.7 10*3/uL — ABNORMAL LOW (ref 4.0–10.5)
nRBC: 0 % (ref 0.0–0.2)

## 2020-08-05 LAB — CMP (CANCER CENTER ONLY)
ALT: 15 U/L (ref 0–44)
AST: 25 U/L (ref 15–41)
Albumin: 4.2 g/dL (ref 3.5–5.0)
Alkaline Phosphatase: 65 U/L (ref 38–126)
Anion gap: 8 (ref 5–15)
BUN: 16 mg/dL (ref 8–23)
CO2: 30 mmol/L (ref 22–32)
Calcium: 10.1 mg/dL (ref 8.9–10.3)
Chloride: 102 mmol/L (ref 98–111)
Creatinine: 0.78 mg/dL (ref 0.44–1.00)
GFR, Estimated: >60 mL/min (ref >60)
Glucose, Bld: 79 mg/dL (ref 70–99)
Potassium: 3.7 mmol/L (ref 3.5–5.1)
Sodium: 140 mmol/L (ref 135–145)
Total Bilirubin: 0.5 mg/dL (ref 0.3–1.2)
Total Protein: 8.7 g/dL — ABNORMAL HIGH (ref 6.5–8.1)

## 2020-08-05 MED ORDER — IOHEXOL 300 MG/ML  SOLN
100.0000 mL | Freq: Once | INTRAMUSCULAR | Status: AC | PRN
  Administered 2020-08-05: 100 mL via INTRAVENOUS

## 2020-08-06 ENCOUNTER — Encounter: Payer: Self-pay | Admitting: Hematology and Oncology

## 2020-08-06 ENCOUNTER — Inpatient Hospital Stay: Payer: Medicare Other | Admitting: Hematology and Oncology

## 2020-08-06 DIAGNOSIS — M7989 Other specified soft tissue disorders: Secondary | ICD-10-CM

## 2020-08-06 DIAGNOSIS — N131 Hydronephrosis with ureteral stricture, not elsewhere classified: Secondary | ICD-10-CM | POA: Diagnosis not present

## 2020-08-06 DIAGNOSIS — Z7901 Long term (current) use of anticoagulants: Secondary | ICD-10-CM | POA: Diagnosis not present

## 2020-08-06 DIAGNOSIS — C541 Malignant neoplasm of endometrium: Secondary | ICD-10-CM | POA: Diagnosis not present

## 2020-08-06 DIAGNOSIS — I700 Atherosclerosis of aorta: Secondary | ICD-10-CM | POA: Diagnosis not present

## 2020-08-06 DIAGNOSIS — Z9221 Personal history of antineoplastic chemotherapy: Secondary | ICD-10-CM | POA: Diagnosis not present

## 2020-08-06 DIAGNOSIS — D539 Nutritional anemia, unspecified: Secondary | ICD-10-CM

## 2020-08-06 DIAGNOSIS — I824Z2 Acute embolism and thrombosis of unspecified deep veins of left distal lower extremity: Secondary | ICD-10-CM

## 2020-08-06 NOTE — Assessment & Plan Note (Signed)
She has persistent leg edema likely due to fluid retention I recommend the patient to walk and increase exercise activity as tolerated

## 2020-08-06 NOTE — Assessment & Plan Note (Signed)
I have reviewed her CT imaging extensively The small lymph node on the pelvic sidewall could be reactive in nature She is not symptomatic I will review the CT imaging with GYN surgeon next week I favor observation to allow further recovery for the patient Alternatively, we can pursue further imaging study and consider pelvic radiation I felt that the patient is quite frail and may not be ready to start systemic treatment In my opinion, I would favor repeat imaging study in 2 to 3 months The patient and her daughter is in agreement with the plan of care and I will call them with an update after discussion with GYN surgeon next week

## 2020-08-06 NOTE — Progress Notes (Signed)
[place] OFFICE PROGRESS NOTE  Patient Care Team: [name], MD as PCP - General (Family Medicine)  ASSESSMENT & PLAN:  Endometrial cancer [facility], [place]) I have reviewed her CT imaging extensively The small lymph node on the pelvic sidewall could be reactive in nature She is not symptomatic I will review the CT imaging with GYN surgeon next week I favor observation to allow further recovery for the patient Alternatively, we can pursue further imaging study and consider pelvic radiation I felt that the patient is quite frail and may not be ready to start systemic treatment In my opinion, I would favor repeat imaging study in 2 to 3 months The patient and her daughter is in agreement with the plan of care and I will call them with an update after discussion with GYN surgeon next week  Deficiency anemia She has persistent anemia She has some minimum bleeding noted and she is on anticoagulation therapy For now, I favor continuing anticoagulation treatment and observe closely  Acute deep vein thrombosis (DVT) of distal vein of left lower extremity ([place]) I felt that her mobility is poor She would be at risk of recurrent DVT restart anticoagulation therapy I favor she continue Xarelto for few more months  Left leg swelling She has persistent leg edema likely due to fluid retention I recommend the patient to walk and increase exercise activity as tolerated   No orders of the defined types were placed in this encounter.   All questions were answered. The patient knows to call the clinic with any problems, questions or concerns. The total time spent in the appointment was 30 minutes encounter with patients including review of chart and various tests results, discussions about plan of care and coordination of care plan   [name], MD 08/06/2020 9:44 AM  INTERVAL HISTORY: Please see below for problem oriented charting. She returns for follow up on review of recent CT  imaging She feels well although she have lost some weight The patient stated she is eating well Her lower extremity edema is stable She denies signs of bleeding herself but her primary care doctor has ordered FOBT and according to her it was positive for She denies chest pain or shortness of breath No abdominal pain, nausea or changes in bowel habits  SUMMARY OF ONCOLOGIC HISTORY: Oncology History Overview Note  ER/PR negative, MSI stable Genetic testing reported out on January 07, 2020 through the Common hereditary cancer panel found no pathogenic mutations   Breast cancer, right breast ([place])  03/13/2014 Initial Diagnosis   Breast cancer, right breast ([place])   01/07/2020 Genetic Testing   Negative genetic testing.  RAD50 c.1556C>T VUS identified.  The Common Hereditary Gene Panel offered by [facility] includes sequencing and/or deletion duplication testing of the following 48 genes: APC, ATM, AXIN2, BARD1, BMPR1A, BRCA1, BRCA2, BRIP1, CDH1, CDK4, CDKN2A (p14ARF), CDKN2A (p16INK4a), CHEK2, CTNNA1, DICER1, EPCAM (Deletion/duplication testing only), GREM1 (promoter region deletion/duplication testing only), KIT, MEN1, MLH1, MSH2, MSH3, MSH6, MUTYH, NBN, NF1, NHTL1, PALB2, PDGFRA, PMS2, POLD1, POLE, PTEN, RAD50, RAD51C, RAD51D, RNF43, SDHB, SDHC, SDHD, SMAD4, SMARCA4. STK11, TP53, TSC1, TSC2, and VHL.  The following genes were evaluated for sequence changes only: SDHA and HOXB13 c.251G>A variant only. The report date is January 07, 2020.   Endometrial cancer ([place])  01/08/2019 Imaging   Hypermetabolic activity within central uterus, consistent with known endometrial carcinoma.   No evidence of local or distant metastatic disease.     01/16/2019 Pathology Results   1. Lymph  node, sentinel, biopsy, left obturator - NO CARCINOMA IDENTIFIED IN ONE LYMPH NODE (0/1) - SEE COMMENT 2. Lymph node, sentinel, biopsy, right obturator - NO CARCINOMA IDENTIFIED IN ONE LYMPH NODE (0/1) - SEE COMMENT 3. Uterus  +/- tubes/ovaries, neoplastic, cervix, bilateral fallopian tubes and ovaries UTERUS: - ENDOMETRIOID CARCINOMA, FIGO GRADE 3, CONFINED TO THE UTERUS - SEE ONCOLOGY TABLE BELOW CERVIX: - NO CARCINOMA IDENTIFIED BILATERAL OVARIES: - NO CARCINOMA IDENTIFIED BILATERAL FALLOPIAN TUBES: - BENIGN PARATUBAL CYSTS (RIGHT) - NO CARCINOMA IDENTIFIED Microscopic Comment 1. and 2. Cytokeratin AE1/3 was performed on the sentinel lymph nodes to exclude micrometastasis. There is no evidence of metastatic carcinoma by immunohistochemistry. 3. UTERUS, CARCINOMA OR CARCINOSARCOMA Procedure: Hysterectomy and bilateral salpingo-oophorectomy Histologic type: Endometrioid carcinoma Histologic Grade: FIGO Grade 3 Myometrial invasion: Depth of invasion: 4 mm 1 ofMyometrial thickness: 27 mm Uterine Serosa Involvement: Not identified Cervical stromal involvement: Not identified Extent of involvement of other organs: N/A Lymphovascular invasion: Not identified Regional Lymph Nodes: Examined: 2 Sentinel 0 Non-sentinel 2 Total Lymph nodes with metastasis: N/A Isolated tumor cells (< 0.2 mm): 0 Micrometastasis: (> 0.2 mm and < 2.0 mm): 0 Macrometastasis: (> 2.0 mm): 0 Extracapsular extension: N/A Tumor block for ancillary studies: 3C MMR / MSI testing: Pending Pathologic Stage Classification (pTNM, AJCC 8th edition): pT1a, pN0 FIGO Stage: FIGO Ia   01/16/2019 Surgery   Surgeon: Donaciano Eva  Pre-operative Diagnosis: endometrial cancer grade 3    Operation: Robotic-assisted laparoscopic total hysterectomy with bilateral salpingoophorectomy, SLN biopsy  Operative Findings:  : 9cm uterus (mildly broadened and bulky), normal appearing fallopian tubes and ovaries.       12/15/2019 Imaging   1. Status post hysterectomy. Soft tissue nodule in the right hemipelvis measuring 3.4 x 2.6 cm. Soft tissue nodule or left pelvic sidewall/iliac lymph node conglomerate measuring 2.8 x 2.1 cm. Findings are  consistent with recurrent/metastatic disease. 2. Moderate volume ascites throughout the abdomen and pelvis with fine peritoneal nodularity noted in the pelvis and new omental or ventral peritoneal soft tissue thickening, consistent with peritoneal metastatic disease. 3. Moderate right hydronephrosis and hydroureter, the distal right ureter obstructed by right pelvic soft tissue mass noted above. 4. Aortic Atherosclerosis (ICD10-I70.0).   12/22/2019 Cancer Staging   Staging form: Corpus Uteri - Carcinoma and Carcinosarcoma, AJCC 8th Edition - Clinical stage from 12/22/2019: FIGO Stage IVB (rcT1a, cN0, cM1) - Signed by [name], MD on 12/22/2019   12/24/2019 Imaging   CT chest 1. No evidence of metastatic disease in the chest. 2. Stable 3 mm pulmonary nodule of the peripheral right lower lobe, almost certainly incidental and benign. Attention on follow-up.  3. Scarring of the left lung base. 4. Aortic Atherosclerosis (ICD10-I70.0).   12/29/2019 Procedure   Preoperative diagnosis:  1. Right ureteral obstruction 2. Metastatic endometrial cancer   Postoperative diagnosis:  1. Right ureteral obstruction 2. Metastatic endometrial cancer   Procedure:   1. Cystoscopy 2. Right ureteral stent placement (6 x 24-no string)  3. Right retrograde pyelography with interpretation   Surgeon: Pryor Curia. M.D.    Intraoperative findings: Right retrograde pyelography was performed using a 6 French ureteral catheter and Omnipaque contrast.  This demonstrated severe deviation of the distal ureter laterally around the patient's pelvic mass.  The distal ureter was clearly compressed extrinsically without intrinsic filling defects.  The ureter was then significantly dilated above the pelvis with some tortuosity extending into a dilated renal collecting system.   12/31/2019 - 04/13/2020 Chemotherapy   The patient  had carboplatin and taxol for chemotherapy treatment.     01/08/2020 Genetic Testing    Genetic testing reported out on January 07, 2020 through the Common hereditary cancer panel found no pathogenic mutations   03/01/2020 Imaging   1. Overall significant improvement, with reduced size of the pelvic adenopathy, reduced conspicuity of prior peritoneal tumor deposits, and resolution of the prior ascites. 2. Right double-J ureteral stent in place with resolution of the prior right hydronephrosis and right hydroureter. 3. Heterogeneous density in the left external iliac and common femoral vein could be from mixing of opacified and unopacified venous blood versus DVT. Consider left lower extremity Doppler venous ultrasound for further workup. 4. Other imaging findings of potential clinical significance: Mild cardiomegaly. Pancreas divisum. Lumbar spondylosis and degenerative disc disease causing multilevel impingement. Subglandular nodularity in the right breast for example on image 6 of series 2 was not hypermetabolic on prior PET-CT of 01/08/2019. 5. Emphysema and aortic atherosclerosis.     05/07/2020 Imaging   1. Small-bowel loops extend inferior to the pelvic floor along the expected course of the vagina. This is compatible with reported clinical history of vaginal cuff dehiscence. 2. Interval decrease in size of the left pelvic sidewall lymph node and soft tissue in the right pelvis. No new or progressive findings on today's exam. 3. Mild right hydronephrosis with double-J internal ureteral stent in situ. 4. Small volume intraperitoneal free fluid. 5. Aortic Atherosclerosis (ICD10-I70.0) and Emphysema (ICD10-J43.9).     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   Assistant Surgeon: none   Specimens: vaginal cuff    Operative Findings: 30cm of small intestine (ileum) eviscerated through vaginal introitus, engorged but pink. Bowel with  palpable pulses in mesentery and viable appearing serosa at completion of procedure. Gaping vaginal cuff, no evidence of laceration (the cuff was smooth, non-bleeding edges, did not appear to be a traumatic dehiscence. No gross intraperitoneal disease palpable.    08/05/2020 Imaging   1. There is a new, nonspecific soft tissue nodule within the left pelvic sidewall measuring 1.1 cm. Cannot rule out recurrent tumor. 2. Interval improvement in right-sided hydronephrosis with removal of right double J nephroureteral stent. 3. Emphysema and aortic atherosclerosis.   Aortic Atherosclerosis (ICD10-I70.0) and Emphysema (ICD10-J43.9)     REVIEW OF SYSTEMS:   Constitutional: Denies fevers, chills  Eyes: Denies blurriness of vision Ears, nose, mouth, throat, and face: Denies mucositis or sore throat Respiratory: Denies cough, dyspnea or wheezes Cardiovascular: Denies palpitation, chest discomfort  Gastrointestinal:  Denies nausea, heartburn or change in bowel habits Skin: Denies abnormal skin rashes Lymphatics: Denies new lymphadenopathy or easy bruising Neurological:Denies numbness, tingling or new weaknesses Behavioral/Psych: Mood is stable, no new changes  All other systems were reviewed with the patient and are negative.  I have reviewed the past medical history, past surgical history, social history and family history with the patient and they are unchanged from previous note.  ALLERGIES:  is allergic to gadolinium and other.  MEDICATIONS:  Current Outpatient Medications  Medication Sig Dispense Refill  . acetaminophen (TYLENOL) 500 MG tablet Take 500 mg by mouth every 6 (six) hours as needed for moderate pain.     [name] alendronate (FOSAMAX) 70 MG tablet Take 70 mg by mouth once a week.     . Calcium Carb-Cholecalciferol (CALCIUM 600+D3) 600-200 MG-UNIT TABS Take 1 tablet by mouth daily.     . Capsaicin (  ARTHRITIS PAIN RELIEF EX) Apply 1 application topically daily as needed (Arthritis  pain).    . fluticasone (FLONASE) 50 MCG/ACT nasal spray Place 1 spray into both nostrils daily as needed for allergies or rhinitis.     . hydrocortisone cream 0.5 % Apply 1 application topically daily as needed for itching.    . levothyroxine (SYNTHROID) 88 MCG tablet Take 88 mcg by mouth every morning.    . loratadine (CLARITIN) 10 MG tablet Take 10 mg by mouth daily.    . naphazoline-pheniramine (ALLERGY EYE) 0.025-0.3 % ophthalmic solution Place 1 drop into both eyes daily as needed for allergies.    [name] ondansetron (ZOFRAN) 8 MG tablet Take 1 tablet (8 mg total) by mouth every 8 (eight) hours as needed. Start on the third day after chemotherapy. 30 tablet 1  . oxyCODONE (OXY IR/ROXICODONE) 5 MG immediate release tablet Take 1 tablet (5 mg total) by mouth every 4 (four) hours as needed for severe pain. For AFTER surgery, do not take and drive 15 tablet 0  . prochlorperazine (COMPAZINE) 10 MG tablet Take 1 tablet (10 mg total) by mouth every 6 (six) hours as needed (Nausea or vomiting). 30 tablet 1  . senna-docusate (SENOKOT-S) 8.6-50 MG tablet Take 2 tablets by mouth at bedtime. For AFTER surgery to prevent constipation, do not take if having diarrhea 30 tablet 0  . XARELTO 20 MG TABS tablet TAKE 1 TABLET(20 MG) BY MOUTH DAILY WITH SUPPER 30 tablet 1   No current facility-administered medications for this visit.    PHYSICAL EXAMINATION: ECOG PERFORMANCE STATUS: 2 - Symptomatic, <50% confined to bed  Vitals:   08/06/20 0907  BP: (!) 148/60  Pulse: 87  Resp: 18  Temp: (!) 97.4 F (36.3 C)  SpO2: 100%   Filed Weights   08/06/20 0907  Weight: 122 lb 9.6 oz (55.6 kg)    GENERAL:alert, no distress and comfortable SKIN: skin color, texture, turgor are normal, no rashes or significant lesions EYES: normal, Conjunctiva are pink and non-injected, sclera clear OROPHARYNX:no exudate, no erythema and lips, buccal mucosa, and tongue normal  NECK: supple, thyroid normal size, non-tender,  without nodularity LYMPH:  no palpable lymphadenopathy in the cervical, axillary or inguinal LUNGS: clear to auscultation and percussion with normal breathing effort HEART: regular rate & rhythm and no murmurs with moderate bilateral lower extremity edema ABDOMEN:abdomen soft, non-tender and normal bowel sounds Musculoskeletal:no cyanosis of digits and no clubbing  NEURO: alert & oriented x 3 with fluent speech, no focal motor/sensory deficits  LABORATORY DATA:  I have reviewed the data as listed    Component Value Date/Time   NA 140 08/05/2020 0953   NA 142 12/08/2014 1153   K 3.7 08/05/2020 0953   K 3.7 12/08/2014 1153   CL 102 08/05/2020 0953   CO2 30 08/05/2020 0953   CO2 29 12/08/2014 1153   GLUCOSE 79 08/05/2020 0953   GLUCOSE 84 12/08/2014 1153   BUN 16 08/05/2020 0953   BUN 8.6 12/08/2014 1153   CREATININE 0.78 08/05/2020 0953   CREATININE 0.7 12/08/2014 1153   CALCIUM 10.1 08/05/2020 0953   CALCIUM 9.3 12/08/2014 1153   PROT 8.7 (H) 08/05/2020 0953   PROT 7.2 12/08/2014 1153   ALBUMIN 4.2 08/05/2020 0953   ALBUMIN 3.8 12/08/2014 1153   AST 25 08/05/2020 0953   AST 21 12/08/2014 1153   ALT 15 08/05/2020 0953   ALT 9 12/08/2014 1153   ALKPHOS 65 08/05/2020 0953   ALKPHOS 63 12/08/2014  1153   BILITOT 0.5 08/05/2020 0953   BILITOT 0.56 12/08/2014 1153   GFRNONAA >60 08/05/2020 0953   GFRAA >60 02/10/2020 0855    No results found for: SPEP, UPEP  Lab Results  Component Value Date   WBC 3.7 (L) 08/05/2020   NEUTROABS 2.2 08/05/2020   HGB 10.8 (L) 08/05/2020   HCT 33.2 (L) 08/05/2020   MCV 107.1 (H) 08/05/2020   PLT 200 08/05/2020      Chemistry      Component Value Date/Time   NA 140 08/05/2020 0953   NA 142 12/08/2014 1153   K 3.7 08/05/2020 0953   K 3.7 12/08/2014 1153   CL 102 08/05/2020 0953   CO2 30 08/05/2020 0953   CO2 29 12/08/2014 1153   BUN 16 08/05/2020 0953   BUN 8.6 12/08/2014 1153   CREATININE 0.78 08/05/2020 0953   CREATININE 0.7  12/08/2014 1153      Component Value Date/Time   CALCIUM 10.1 08/05/2020 0953   CALCIUM 9.3 12/08/2014 1153   ALKPHOS 65 08/05/2020 0953   ALKPHOS 63 12/08/2014 1153   AST 25 08/05/2020 0953   AST 21 12/08/2014 1153   ALT 15 08/05/2020 0953   ALT 9 12/08/2014 1153   BILITOT 0.5 08/05/2020 0953   BILITOT 0.56 12/08/2014 1153       RADIOGRAPHIC STUDIES: I have reviewed CT imaging with the patient and her daughter over the phone I have personally reviewed the radiological images as listed and agreed with the findings in the report. CT ABDOMEN PELVIS W CONTRAST  Result Date: 08/05/2020 CLINICAL DATA:  Restaging endometrial carcinoma. EXAM: CT ABDOMEN AND PELVIS WITH CONTRAST TECHNIQUE: Multidetector CT imaging of the abdomen and pelvis was performed using the standard protocol following bolus administration of intravenous contrast. CONTRAST:  189m OMNIPAQUE IOHEXOL 300 MG/ML  SOLN COMPARISON:  05/07/2020 FINDINGS: Lower chest: Changes of centrilobular emphysema noted within both lung bases. No pleural effusion identified. No suspicious lung nodule or mass. Hepatobiliary: No focal liver abnormality is seen. No gallstones, gallbladder wall thickening, or biliary dilatation. Pancreas: Unremarkable. No pancreatic ductal dilatation or surrounding inflammatory changes. Spleen: Normal in size without focal abnormality. Adrenals/Urinary Tract: Normal appearance of the adrenal glands. No kidney mass or hydronephrosis identified. Interval improvement in right-sided hydronephrosis with removal of right double J nephroureteral stent. The urinary bladder is unremarkable. Stomach/Bowel: The stomach appears normal. The appendix is visualized and is within normal limits. No signs of bowel wall thickening, inflammation, or distension. Vascular/Lymphatic: Aortic atherosclerosis without aneurysm. Patent vasculature. No abdominal adenopathy. Left pelvic sidewall soft tissue nodule appears new compared with the  previous exam measuring 1.1 cm, coronal image 45/5 and axial image 58/2. Reproductive: Status post hysterectomy.  No adnexal mass. Other: No free fluid or fluid collections Musculoskeletal: Degenerative disc disease noted within the lumbar spine. No acute or suspicious osseous findings. IMPRESSION: 1. There is a new, nonspecific soft tissue nodule within the left pelvic sidewall measuring 1.1 cm. Cannot rule out recurrent tumor. 2. Interval improvement in right-sided hydronephrosis with removal of right double J nephroureteral stent. 3. Emphysema and aortic atherosclerosis. Aortic Atherosclerosis (ICD10-I70.0) and Emphysema (ICD10-J43.9). Electronically Signed   By: TKerby MoorsM.D.   On: 08/05/2020 16:09

## 2020-08-06 NOTE — Assessment & Plan Note (Signed)
I felt that her mobility is poor She would be at risk of recurrent DVT restart anticoagulation therapy I favor she continue Xarelto for few more months

## 2020-08-06 NOTE — Assessment & Plan Note (Signed)
She has persistent anemia She has some minimum bleeding noted and she is on anticoagulation therapy For now, I favor continuing anticoagulation treatment and observe closely

## 2020-08-10 ENCOUNTER — Telehealth: Payer: Self-pay

## 2020-08-10 NOTE — Telephone Encounter (Signed)
Called to schedule appt. Offered 2 pm for this Friday. She declined due to work and having to go out of town. Appt scheduled for 3/29 per daughters schedule. She is aware of appt.

## 2020-08-10 NOTE — Telephone Encounter (Signed)
-----   Message from [name], MD sent at 08/10/2020  1:17 PM EDT ----- Regarding: another discussion Please call daughter, schedule 1 hour visit

## 2020-08-17 ENCOUNTER — Telehealth: Payer: Self-pay | Admitting: Hematology and Oncology

## 2020-08-17 ENCOUNTER — Encounter: Payer: Self-pay | Admitting: Hematology and Oncology

## 2020-08-17 ENCOUNTER — Other Ambulatory Visit: Payer: Self-pay

## 2020-08-17 ENCOUNTER — Inpatient Hospital Stay: Payer: Medicare Other | Admitting: Hematology and Oncology

## 2020-08-17 VITALS — BP 164/63 | HR 69 | Temp 97.3°F | Resp 18 | Ht 62.0 in | Wt 122.6 lb

## 2020-08-17 DIAGNOSIS — Z170 Estrogen receptor positive status [ER+]: Secondary | ICD-10-CM

## 2020-08-17 DIAGNOSIS — I700 Atherosclerosis of aorta: Secondary | ICD-10-CM | POA: Diagnosis not present

## 2020-08-17 DIAGNOSIS — C50511 Malignant neoplasm of lower-outer quadrant of right female breast: Secondary | ICD-10-CM | POA: Diagnosis not present

## 2020-08-17 DIAGNOSIS — N131 Hydronephrosis with ureteral stricture, not elsewhere classified: Secondary | ICD-10-CM | POA: Diagnosis not present

## 2020-08-17 DIAGNOSIS — R911 Solitary pulmonary nodule: Secondary | ICD-10-CM | POA: Insufficient documentation

## 2020-08-17 DIAGNOSIS — IMO0001 Reserved for inherently not codable concepts without codable children: Secondary | ICD-10-CM

## 2020-08-17 DIAGNOSIS — D539 Nutritional anemia, unspecified: Secondary | ICD-10-CM | POA: Diagnosis not present

## 2020-08-17 DIAGNOSIS — Z9221 Personal history of antineoplastic chemotherapy: Secondary | ICD-10-CM | POA: Diagnosis not present

## 2020-08-17 DIAGNOSIS — Z7901 Long term (current) use of anticoagulants: Secondary | ICD-10-CM | POA: Diagnosis not present

## 2020-08-17 DIAGNOSIS — C541 Malignant neoplasm of endometrium: Secondary | ICD-10-CM | POA: Diagnosis not present

## 2020-08-17 NOTE — Progress Notes (Signed)
[place] OFFICE PROGRESS NOTE  Patient Care Team: [name], MD as PCP - General (Family Medicine)  ASSESSMENT & PLAN:  Endometrial cancer [facility]) I have reviewed the last 3 CT imaging studies with the patient and her daughter The pelvic lymph node on the left side has been present since October 2021 She is not symptomatic It is in a difficult location to order a biopsy I have reviewed the case with GYN surgeon who felt that the lymph node is suspicious and could represent malignancy The patient tolerated chemotherapy very poorly in the past I am somewhat reluctant to prescribe chemotherapy even with this persistent finding The patient would qualify for combination of pembrolizumab/Lenvima in the future and I would prefer that over chemotherapy  We have a long discussion today about the risk, benefits, side effects of treatment versus active surveillance with repeat imaging study in June Ultimately, the patient is in agreement for surveillance CT imaging in June Her previous CT imaging show some nonspecific lung nodules and I plan to order a CT scan of the chest, abdomen and pelvis with contrast early June along with blood work for further assessment The patient is educated to watch her for signs and symptoms of disease recurrence including abdominal pain, changes in bowel habits or vaginal bleeding  Breast cancer, right breast ([place]) She has remote history of breast cancer She is scheduled for breast biopsy next month I will follow on results of the biopsy    Orders Placed This Encounter  Procedures  . CT CHEST ABDOMEN PELVIS W CONTRAST    Standing Status:   Future    Standing Expiration Date:   08/17/2021    Order Specific Question:   Preferred imaging location?    Answer:   [facility]    Order Specific Question:   Radiology Contrast Protocol - do NOT remove file path    Answer:   [email or web address]    All  questions were answered. The patient knows to call the clinic with any problems, questions or concerns. The total time spent in the appointment was 30 minutes encounter with patients including review of chart and various tests results, discussions about plan of care and coordination of care plan   [name], MD 08/17/2020 3:18 PM  INTERVAL HISTORY: Please see below for problem oriented charting. She returns with her daughter for further follow-up She have no pelvic pain, hip pain or changes in bowel habits She is scheduled for breast biopsy next month The purpose of today's visit is to review recent CT findings and to discuss options  SUMMARY OF ONCOLOGIC HISTORY: Oncology History Overview Note  ER/PR negative, MSI stable Genetic testing reported out on January 07, 2020 through the Common hereditary cancer panel found no pathogenic mutations   Breast cancer, right breast ([place])  03/13/2014 Initial Diagnosis   Breast cancer, right breast ([place])   01/07/2020 Genetic Testing   Negative genetic testing.  RAD50 c.1556C>T VUS identified.  The Common Hereditary Gene Panel offered by [facility] includes sequencing and/or deletion duplication testing of the following 48 genes: APC, ATM, AXIN2, BARD1, BMPR1A, BRCA1, BRCA2, BRIP1, CDH1, CDK4, CDKN2A (p14ARF), CDKN2A (p16INK4a), CHEK2, CTNNA1, DICER1, EPCAM (Deletion/duplication testing only), GREM1 (promoter region deletion/duplication testing only), KIT, MEN1, MLH1, MSH2, MSH3, MSH6, MUTYH, NBN, NF1, NHTL1, PALB2, PDGFRA, PMS2, POLD1, POLE, PTEN, RAD50, RAD51C, RAD51D, RNF43, SDHB, SDHC, SDHD, SMAD4, SMARCA4. STK11, TP53, TSC1, TSC2, and VHL.  The following genes were evaluated for sequence changes only: SDHA and HOXB13  c.251G>A variant only. The report date is January 07, 2020.   Endometrial cancer ([place])  01/08/2019 Imaging   Hypermetabolic activity within central uterus, consistent with known endometrial carcinoma.   No evidence of local or distant  metastatic disease.     01/16/2019 Pathology Results   1. Lymph node, sentinel, biopsy, left obturator - NO CARCINOMA IDENTIFIED IN ONE LYMPH NODE (0/1) - SEE COMMENT 2. Lymph node, sentinel, biopsy, right obturator - NO CARCINOMA IDENTIFIED IN ONE LYMPH NODE (0/1) - SEE COMMENT 3. Uterus +/- tubes/ovaries, neoplastic, cervix, bilateral fallopian tubes and ovaries UTERUS: - ENDOMETRIOID CARCINOMA, FIGO GRADE 3, CONFINED TO THE UTERUS - SEE ONCOLOGY TABLE BELOW CERVIX: - NO CARCINOMA IDENTIFIED BILATERAL OVARIES: - NO CARCINOMA IDENTIFIED BILATERAL FALLOPIAN TUBES: - BENIGN PARATUBAL CYSTS (RIGHT) - NO CARCINOMA IDENTIFIED Microscopic Comment 1. and 2. Cytokeratin AE1/3 was performed on the sentinel lymph nodes to exclude micrometastasis. There is no evidence of metastatic carcinoma by immunohistochemistry. 3. UTERUS, CARCINOMA OR CARCINOSARCOMA Procedure: Hysterectomy and bilateral salpingo-oophorectomy Histologic type: Endometrioid carcinoma Histologic Grade: FIGO Grade 3 Myometrial invasion: Depth of invasion: 4 mm 1 ofMyometrial thickness: 27 mm Uterine Serosa Involvement: Not identified Cervical stromal involvement: Not identified Extent of involvement of other organs: N/A Lymphovascular invasion: Not identified Regional Lymph Nodes: Examined: 2 Sentinel 0 Non-sentinel 2 Total Lymph nodes with metastasis: N/A Isolated tumor cells (< 0.2 mm): 0 Micrometastasis: (> 0.2 mm and < 2.0 mm): 0 Macrometastasis: (> 2.0 mm): 0 Extracapsular extension: N/A Tumor block for ancillary studies: 3C MMR / MSI testing: Pending Pathologic Stage Classification (pTNM, AJCC 8th edition): pT1a, pN0 FIGO Stage: FIGO Ia   01/16/2019 Surgery   Surgeon: Donaciano Eva  Pre-operative Diagnosis: endometrial cancer grade 3    Operation: Robotic-assisted laparoscopic total hysterectomy with bilateral salpingoophorectomy, SLN biopsy  Operative Findings:  : 9cm uterus (mildly broadened  and bulky), normal appearing fallopian tubes and ovaries.       12/15/2019 Imaging   1. Status post hysterectomy. Soft tissue nodule in the right hemipelvis measuring 3.4 x 2.6 cm. Soft tissue nodule or left pelvic sidewall/iliac lymph node conglomerate measuring 2.8 x 2.1 cm. Findings are consistent with recurrent/metastatic disease. 2. Moderate volume ascites throughout the abdomen and pelvis with fine peritoneal nodularity noted in the pelvis and new omental or ventral peritoneal soft tissue thickening, consistent with peritoneal metastatic disease. 3. Moderate right hydronephrosis and hydroureter, the distal right ureter obstructed by right pelvic soft tissue mass noted above. 4. Aortic Atherosclerosis (ICD10-I70.0).   12/22/2019 Cancer Staging   Staging form: Corpus Uteri - Carcinoma and Carcinosarcoma, AJCC 8th Edition - Clinical stage from 12/22/2019: FIGO Stage IVB (rcT1a, cN0, cM1) - Signed by [name], MD on 12/22/2019   12/24/2019 Imaging   CT chest 1. No evidence of metastatic disease in the chest. 2. Stable 3 mm pulmonary nodule of the peripheral right lower lobe, almost certainly incidental and benign. Attention on follow-up.  3. Scarring of the left lung base. 4. Aortic Atherosclerosis (ICD10-I70.0).   12/29/2019 Procedure   Preoperative diagnosis:  1. Right ureteral obstruction 2. Metastatic endometrial cancer   Postoperative diagnosis:  1. Right ureteral obstruction 2. Metastatic endometrial cancer   Procedure:   1. Cystoscopy 2. Right ureteral stent placement (6 x 24-no string)  3. Right retrograde pyelography with interpretation   Surgeon: Pryor Curia. M.D.    Intraoperative findings: Right retrograde pyelography was performed using a 6 French ureteral catheter and Omnipaque contrast.  This demonstrated severe deviation of  the distal ureter laterally around the patient's pelvic mass.  The distal ureter was clearly compressed extrinsically without intrinsic  filling defects.  The ureter was then significantly dilated above the pelvis with some tortuosity extending into a dilated renal collecting system.   12/31/2019 - 04/13/2020 Chemotherapy   The patient had carboplatin and taxol for chemotherapy treatment.     01/08/2020 Genetic Testing   Genetic testing reported out on January 07, 2020 through the Common hereditary cancer panel found no pathogenic mutations   03/01/2020 Imaging   1. Overall significant improvement, with reduced size of the pelvic adenopathy, reduced conspicuity of prior peritoneal tumor deposits, and resolution of the prior ascites. 2. Right double-J ureteral stent in place with resolution of the prior right hydronephrosis and right hydroureter. 3. Heterogeneous density in the left external iliac and common femoral vein could be from mixing of opacified and unopacified venous blood versus DVT. Consider left lower extremity Doppler venous ultrasound for further workup. 4. Other imaging findings of potential clinical significance: Mild cardiomegaly. Pancreas divisum. Lumbar spondylosis and degenerative disc disease causing multilevel impingement. Subglandular nodularity in the right breast for example on image 6 of series 2 was not hypermetabolic on prior PET-CT of 01/08/2019. 5. Emphysema and aortic atherosclerosis.     05/07/2020 Imaging   1. Small-bowel loops extend inferior to the pelvic floor along the expected course of the vagina. This is compatible with reported clinical history of vaginal cuff dehiscence. 2. Interval decrease in size of the left pelvic sidewall lymph node and soft tissue in the right pelvis. No new or progressive findings on today's exam. 3. Mild right hydronephrosis with double-J internal ureteral stent in situ. 4. Small volume intraperitoneal free fluid. 5. Aortic Atherosclerosis (ICD10-I70.0) and Emphysema (ICD10-J43.9).     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   Assistant Surgeon: none   Specimens: vaginal cuff    Operative Findings: 30cm of small intestine (ileum) eviscerated through vaginal introitus, engorged but pink. Bowel with palpable pulses in mesentery and viable appearing serosa at completion of procedure. Gaping vaginal cuff, no evidence of laceration (the cuff was smooth, non-bleeding edges, did not appear to be a traumatic dehiscence. No gross intraperitoneal disease palpable.    08/05/2020 Imaging   1. There is a new, nonspecific soft tissue nodule within the left pelvic sidewall measuring 1.1 cm. Cannot rule out recurrent tumor. 2. Interval improvement in right-sided hydronephrosis with removal of right double J nephroureteral stent. 3. Emphysema and aortic atherosclerosis.   Aortic Atherosclerosis (ICD10-I70.0) and Emphysema (ICD10-J43.9)     REVIEW OF SYSTEMS:   Constitutional: Denies fevers, chills or abnormal weight loss Eyes: Denies blurriness of vision Ears, nose, mouth, throat, and face: Denies mucositis or sore throat Respiratory: Denies cough, dyspnea or wheezes Cardiovascular: Denies palpitation, chest discomfort or lower extremity swelling Gastrointestinal:  Denies nausea, heartburn or change in bowel habits Skin: Denies abnormal skin rashes Lymphatics: Denies new lymphadenopathy or easy bruising Neurological:Denies numbness, tingling or new weaknesses Behavioral/Psych: Mood is stable, no new changes  All other systems were reviewed with the patient and are negative.  I have reviewed the past medical history, past surgical history, social history and family history with the patient and they are unchanged from previous note.  ALLERGIES:  is allergic to gadolinium and other.  MEDICATIONS:  Current Outpatient Medications  Medication Sig Dispense Refill  . acetaminophen (TYLENOL) 500 MG  tablet Take 500 mg by  mouth every 6 (six) hours as needed for moderate pain.     [name] alendronate (FOSAMAX) 70 MG tablet Take 70 mg by mouth once a week.     . Calcium Carb-Cholecalciferol (CALCIUM 600+D3) 600-200 MG-UNIT TABS Take 1 tablet by mouth daily.     . Capsaicin (ARTHRITIS PAIN RELIEF EX) Apply 1 application topically daily as needed (Arthritis pain).    . fluticasone (FLONASE) 50 MCG/ACT nasal spray Place 1 spray into both nostrils daily as needed for allergies or rhinitis.     . hydrocortisone cream 0.5 % Apply 1 application topically daily as needed for itching.    . levothyroxine (SYNTHROID) 88 MCG tablet Take 88 mcg by mouth every morning.    . loratadine (CLARITIN) 10 MG tablet Take 10 mg by mouth daily.    . naphazoline-pheniramine (ALLERGY EYE) 0.025-0.3 % ophthalmic solution Place 1 drop into both eyes daily as needed for allergies.    [name] ondansetron (ZOFRAN) 8 MG tablet Take 1 tablet (8 mg total) by mouth every 8 (eight) hours as needed. Start on the third day after chemotherapy. 30 tablet 1  . oxyCODONE (OXY IR/ROXICODONE) 5 MG immediate release tablet Take 1 tablet (5 mg total) by mouth every 4 (four) hours as needed for severe pain. For AFTER surgery, do not take and drive 15 tablet 0  . prochlorperazine (COMPAZINE) 10 MG tablet Take 1 tablet (10 mg total) by mouth every 6 (six) hours as needed (Nausea or vomiting). 30 tablet 1  . senna-docusate (SENOKOT-S) 8.6-50 MG tablet Take 2 tablets by mouth at bedtime. For AFTER surgery to prevent constipation, do not take if having diarrhea 30 tablet 0  . XARELTO 20 MG TABS tablet TAKE 1 TABLET(20 MG) BY MOUTH DAILY WITH SUPPER 30 tablet 1   No current facility-administered medications for this visit.    PHYSICAL EXAMINATION: ECOG PERFORMANCE STATUS: 1 - Symptomatic but completely ambulatory  Vitals:   08/17/20 1402  BP: (!) 164/63  Pulse: 69  Resp: 18  Temp: (!) 97.3 F (36.3 C)  SpO2: 100%   Filed Weights   08/17/20 1402  Weight: 122 lb 9.6 oz  (55.6 kg)    GENERAL:alert, no distress and comfortable NEURO: alert & oriented x 3 with fluent speech, no focal motor/sensory deficits  LABORATORY DATA:  I have reviewed the data as listed    Component Value Date/Time   NA 140 08/05/2020 0953   NA 142 12/08/2014 1153   K 3.7 08/05/2020 0953   K 3.7 12/08/2014 1153   CL 102 08/05/2020 0953   CO2 30 08/05/2020 0953   CO2 29 12/08/2014 1153   GLUCOSE 79 08/05/2020 0953   GLUCOSE 84 12/08/2014 1153   BUN 16 08/05/2020 0953   BUN 8.6 12/08/2014 1153   CREATININE 0.78 08/05/2020 0953   CREATININE 0.7 12/08/2014 1153   CALCIUM 10.1 08/05/2020 0953   CALCIUM 9.3 12/08/2014 1153   PROT 8.7 (H) 08/05/2020 0953   PROT 7.2 12/08/2014 1153   ALBUMIN 4.2 08/05/2020 0953   ALBUMIN 3.8 12/08/2014 1153   AST 25 08/05/2020 0953   AST 21 12/08/2014 1153   ALT 15 08/05/2020 0953   ALT 9 12/08/2014 1153   ALKPHOS 65 08/05/2020 0953   ALKPHOS 63 12/08/2014 1153   BILITOT 0.5 08/05/2020 0953   BILITOT 0.56 12/08/2014 1153   GFRNONAA >60 08/05/2020 0953   GFRAA >60 02/10/2020 0855    No results found for: SPEP, UPEP  Lab Results  Component Value Date   WBC 3.7 (L) 08/05/2020   NEUTROABS 2.2 08/05/2020   HGB 10.8 (L) 08/05/2020   HCT 33.2 (L) 08/05/2020   MCV 107.1 (H) 08/05/2020   PLT 200 08/05/2020      Chemistry      Component Value Date/Time   NA 140 08/05/2020 0953   NA 142 12/08/2014 1153   K 3.7 08/05/2020 0953   K 3.7 12/08/2014 1153   CL 102 08/05/2020 0953   CO2 30 08/05/2020 0953   CO2 29 12/08/2014 1153   BUN 16 08/05/2020 0953   BUN 8.6 12/08/2014 1153   CREATININE 0.78 08/05/2020 0953   CREATININE 0.7 12/08/2014 1153      Component Value Date/Time   CALCIUM 10.1 08/05/2020 0953   CALCIUM 9.3 12/08/2014 1153   ALKPHOS 65 08/05/2020 0953   ALKPHOS 63 12/08/2014 1153   AST 25 08/05/2020 0953   AST 21 12/08/2014 1153   ALT 15 08/05/2020 0953   ALT 9 12/08/2014 1153   BILITOT 0.5 08/05/2020 0953    BILITOT 0.56 12/08/2014 1153       RADIOGRAPHIC STUDIES: I have reviewed multiple CT imaging studies with the patient and her daughter I have personally reviewed the radiological images as listed and agreed with the findings in the report. CT ABDOMEN PELVIS W CONTRAST  Result Date: 08/05/2020 CLINICAL DATA:  Restaging endometrial carcinoma. EXAM: CT ABDOMEN AND PELVIS WITH CONTRAST TECHNIQUE: Multidetector CT imaging of the abdomen and pelvis was performed using the standard protocol following bolus administration of intravenous contrast. CONTRAST:  142m OMNIPAQUE IOHEXOL 300 MG/ML  SOLN COMPARISON:  05/07/2020 FINDINGS: Lower chest: Changes of centrilobular emphysema noted within both lung bases. No pleural effusion identified. No suspicious lung nodule or mass. Hepatobiliary: No focal liver abnormality is seen. No gallstones, gallbladder wall thickening, or biliary dilatation. Pancreas: Unremarkable. No pancreatic ductal dilatation or surrounding inflammatory changes. Spleen: Normal in size without focal abnormality. Adrenals/Urinary Tract: Normal appearance of the adrenal glands. No kidney mass or hydronephrosis identified. Interval improvement in right-sided hydronephrosis with removal of right double J nephroureteral stent. The urinary bladder is unremarkable. Stomach/Bowel: The stomach appears normal. The appendix is visualized and is within normal limits. No signs of bowel wall thickening, inflammation, or distension. Vascular/Lymphatic: Aortic atherosclerosis without aneurysm. Patent vasculature. No abdominal adenopathy. Left pelvic sidewall soft tissue nodule appears new compared with the previous exam measuring 1.1 cm, coronal image 45/5 and axial image 58/2. Reproductive: Status post hysterectomy.  No adnexal mass. Other: No free fluid or fluid collections Musculoskeletal: Degenerative disc disease noted within the lumbar spine. No acute or suspicious osseous findings. IMPRESSION: 1. There is a  new, nonspecific soft tissue nodule within the left pelvic sidewall measuring 1.1 cm. Cannot rule out recurrent tumor. 2. Interval improvement in right-sided hydronephrosis with removal of right double J nephroureteral stent. 3. Emphysema and aortic atherosclerosis. Aortic Atherosclerosis (ICD10-I70.0) and Emphysema (ICD10-J43.9). Electronically Signed   By: TKerby MoorsM.D.   On: 08/05/2020 16:09

## 2020-08-17 NOTE — Assessment & Plan Note (Signed)
She has remote history of breast cancer She is scheduled for breast biopsy next month I will follow on results of the biopsy

## 2020-08-17 NOTE — Assessment & Plan Note (Signed)
I have reviewed the last 3 CT imaging studies with the patient and her daughter The pelvic lymph node on the left side has been present since October 2021 She is not symptomatic It is in a difficult location to order a biopsy I have reviewed the case with GYN surgeon who felt that the lymph node is suspicious and could represent malignancy The patient tolerated chemotherapy very poorly in the past I am somewhat reluctant to prescribe chemotherapy even with this persistent finding The patient would qualify for combination of pembrolizumab/Lenvima in the future and I would prefer that over chemotherapy  We have a long discussion today about the risk, benefits, side effects of treatment versus active surveillance with repeat imaging study in June Ultimately, the patient is in agreement for surveillance CT imaging in June Her previous CT imaging show some nonspecific lung nodules and I plan to order a CT scan of the chest, abdomen and pelvis with contrast early June along with blood work for further assessment The patient is educated to watch her for signs and symptoms of disease recurrence including abdominal pain, changes in bowel habits or vaginal bleeding

## 2020-08-17 NOTE — Telephone Encounter (Signed)
Scheduled per sch msg. Gave avs and calendar  

## 2020-08-27 ENCOUNTER — Other Ambulatory Visit: Payer: Self-pay

## 2020-08-27 ENCOUNTER — Other Ambulatory Visit: Payer: Self-pay | Admitting: Family Medicine

## 2020-08-27 ENCOUNTER — Ambulatory Visit
Admission: RE | Admit: 2020-08-27 | Discharge: 2020-08-27 | Disposition: A | Discharge status: Home / Self Care | Payer: Medicare Other | Source: Ambulatory Visit | Attending: Family Medicine | Admitting: Family Medicine

## 2020-08-27 DIAGNOSIS — R928 Other abnormal and inconclusive findings on diagnostic imaging of breast: Secondary | ICD-10-CM

## 2020-08-27 DIAGNOSIS — N6312 Unspecified lump in the right breast, upper inner quadrant: Secondary | ICD-10-CM | POA: Diagnosis not present

## 2020-09-22 ENCOUNTER — Other Ambulatory Visit (HOSPITAL_COMMUNITY): Payer: Self-pay | Admitting: Urology

## 2020-09-22 DIAGNOSIS — C574 Malignant neoplasm of uterine adnexa, unspecified: Secondary | ICD-10-CM

## 2020-09-22 DIAGNOSIS — N131 Hydronephrosis with ureteral stricture, not elsewhere classified: Secondary | ICD-10-CM

## 2020-09-26 ENCOUNTER — Other Ambulatory Visit: Payer: Self-pay | Admitting: Hematology and Oncology

## 2020-09-27 DIAGNOSIS — N131 Hydronephrosis with ureteral stricture, not elsewhere classified: Secondary | ICD-10-CM | POA: Diagnosis not present

## 2020-10-04 ENCOUNTER — Other Ambulatory Visit: Payer: Self-pay

## 2020-10-04 ENCOUNTER — Encounter (HOSPITAL_COMMUNITY)
Admission: RE | Admit: 2020-10-04 | Discharge: 2020-10-04 | Disposition: A | Discharge status: Home / Self Care | Payer: Medicare Other | Source: Ambulatory Visit | Attending: Urology | Admitting: Urology

## 2020-10-04 ENCOUNTER — Other Ambulatory Visit: Payer: Self-pay | Admitting: Hematology and Oncology

## 2020-10-04 DIAGNOSIS — C574 Malignant neoplasm of uterine adnexa, unspecified: Secondary | ICD-10-CM | POA: Diagnosis present

## 2020-10-04 DIAGNOSIS — C541 Malignant neoplasm of endometrium: Secondary | ICD-10-CM

## 2020-10-04 DIAGNOSIS — N1330 Unspecified hydronephrosis: Secondary | ICD-10-CM | POA: Diagnosis not present

## 2020-10-04 DIAGNOSIS — N131 Hydronephrosis with ureteral stricture, not elsewhere classified: Secondary | ICD-10-CM | POA: Diagnosis not present

## 2020-10-04 DIAGNOSIS — N19 Unspecified kidney failure: Secondary | ICD-10-CM | POA: Diagnosis not present

## 2020-10-04 MED ORDER — FUROSEMIDE 10 MG/ML IJ SOLN: 28.0000 mg | Freq: Once | INTRAMUSCULAR | Status: AC

## 2020-10-04 MED ORDER — TECHNETIUM TC 99M MERTIATIDE
5.3000 | Freq: Once | INTRAVENOUS | Status: AC | PRN
  Administered 2020-10-04: 5.3 via INTRAVENOUS

## 2020-10-04 MED ORDER — FUROSEMIDE 10 MG/ML IJ SOLN
INTRAMUSCULAR | Status: AC
  Administered 2020-10-04: 28 mg via INTRAVENOUS
  Filled 2020-10-04: qty 4

## 2020-10-08 DIAGNOSIS — N131 Hydronephrosis with ureteral stricture, not elsewhere classified: Secondary | ICD-10-CM | POA: Diagnosis not present

## 2020-10-14 ENCOUNTER — Telehealth: Payer: Self-pay

## 2020-10-14 ENCOUNTER — Other Ambulatory Visit: Payer: Self-pay | Admitting: Hematology and Oncology

## 2020-10-14 DIAGNOSIS — M25559 Pain in unspecified hip: Secondary | ICD-10-CM

## 2020-10-14 DIAGNOSIS — R1900 Intra-abdominal and pelvic swelling, mass and lump, unspecified site: Secondary | ICD-10-CM

## 2020-10-14 DIAGNOSIS — C541 Malignant neoplasm of endometrium: Secondary | ICD-10-CM

## 2020-10-14 MED ORDER — OXYCODONE HCL 5 MG PO TABS: 5.0000 mg | ORAL_TABLET | Freq: Four times a day (QID) | ORAL | 0 refills | Status: DC | PRN

## 2020-10-14 NOTE — Telephone Encounter (Signed)
Called and given below message. She verbalzied understanding. 

## 2020-10-14 NOTE — Telephone Encounter (Signed)
Done Warn her about risk of sedation and constipation 

## 2020-10-14 NOTE — Telephone Encounter (Signed)
She called and left a message to call her. Called back. She is complaining of left leg pain from the hip to knee area. She has been taking tylenol for the pain. She is asking for a Rx for oxycodone. She ask that it be sent to [facility] on [address] if it possible.

## 2020-10-25 ENCOUNTER — Encounter (HOSPITAL_COMMUNITY): Payer: Self-pay

## 2020-10-25 ENCOUNTER — Inpatient Hospital Stay: Payer: Medicare Other | Attending: Gynecologic Oncology

## 2020-10-25 ENCOUNTER — Ambulatory Visit (HOSPITAL_COMMUNITY)
Admission: RE | Admit: 2020-10-25 | Discharge: 2020-10-25 | Disposition: A | Discharge status: Home / Self Care | Payer: Medicare Other | Source: Ambulatory Visit | Attending: Hematology and Oncology | Admitting: Hematology and Oncology

## 2020-10-25 ENCOUNTER — Other Ambulatory Visit: Payer: Self-pay

## 2020-10-25 DIAGNOSIS — G893 Neoplasm related pain (acute) (chronic): Secondary | ICD-10-CM | POA: Diagnosis not present

## 2020-10-25 DIAGNOSIS — R197 Diarrhea, unspecified: Secondary | ICD-10-CM | POA: Diagnosis not present

## 2020-10-25 DIAGNOSIS — Z79899 Other long term (current) drug therapy: Secondary | ICD-10-CM | POA: Insufficient documentation

## 2020-10-25 DIAGNOSIS — I2510 Atherosclerotic heart disease of native coronary artery without angina pectoris: Secondary | ICD-10-CM | POA: Diagnosis not present

## 2020-10-25 DIAGNOSIS — Z853 Personal history of malignant neoplasm of breast: Secondary | ICD-10-CM | POA: Insufficient documentation

## 2020-10-25 DIAGNOSIS — R634 Abnormal weight loss: Secondary | ICD-10-CM | POA: Insufficient documentation

## 2020-10-25 DIAGNOSIS — C541 Malignant neoplasm of endometrium: Secondary | ICD-10-CM

## 2020-10-25 DIAGNOSIS — M19011 Primary osteoarthritis, right shoulder: Secondary | ICD-10-CM | POA: Diagnosis not present

## 2020-10-25 DIAGNOSIS — N135 Crossing vessel and stricture of ureter without hydronephrosis: Secondary | ICD-10-CM | POA: Insufficient documentation

## 2020-10-25 DIAGNOSIS — Z5112 Encounter for antineoplastic immunotherapy: Secondary | ICD-10-CM | POA: Insufficient documentation

## 2020-10-25 DIAGNOSIS — Z7189 Other specified counseling: Secondary | ICD-10-CM

## 2020-10-25 DIAGNOSIS — Z7901 Long term (current) use of anticoagulants: Secondary | ICD-10-CM | POA: Diagnosis not present

## 2020-10-25 DIAGNOSIS — Z86718 Personal history of other venous thrombosis and embolism: Secondary | ICD-10-CM | POA: Insufficient documentation

## 2020-10-25 DIAGNOSIS — R5381 Other malaise: Secondary | ICD-10-CM | POA: Diagnosis not present

## 2020-10-25 DIAGNOSIS — M79605 Pain in left leg: Secondary | ICD-10-CM | POA: Diagnosis not present

## 2020-10-25 DIAGNOSIS — M419 Scoliosis, unspecified: Secondary | ICD-10-CM | POA: Diagnosis not present

## 2020-10-25 DIAGNOSIS — I700 Atherosclerosis of aorta: Secondary | ICD-10-CM | POA: Insufficient documentation

## 2020-10-25 DIAGNOSIS — M7989 Other specified soft tissue disorders: Secondary | ICD-10-CM

## 2020-10-25 DIAGNOSIS — E876 Hypokalemia: Secondary | ICD-10-CM | POA: Diagnosis not present

## 2020-10-25 DIAGNOSIS — J432 Centrilobular emphysema: Secondary | ICD-10-CM | POA: Diagnosis not present

## 2020-10-25 LAB — CMP (CANCER CENTER ONLY)
ALT: 10 U/L (ref 0–44)
AST: 18 U/L (ref 15–41)
Albumin: 3.8 g/dL (ref 3.5–5.0)
Alkaline Phosphatase: 66 U/L (ref 38–126)
Anion gap: 11 (ref 5–15)
BUN: 8 mg/dL (ref 8–23)
CO2: 29 mmol/L (ref 22–32)
Calcium: 10.2 mg/dL (ref 8.9–10.3)
Chloride: 98 mmol/L (ref 98–111)
Creatinine: 0.74 mg/dL (ref 0.44–1.00)
GFR, Estimated: >60 mL/min (ref >60)
Glucose, Bld: 94 mg/dL (ref 70–99)
Potassium: 3.4 mmol/L — ABNORMAL LOW (ref 3.5–5.1)
Sodium: 138 mmol/L (ref 135–145)
Total Bilirubin: 0.5 mg/dL (ref 0.3–1.2)
Total Protein: 8.8 g/dL — ABNORMAL HIGH (ref 6.5–8.1)

## 2020-10-25 LAB — CBC WITH DIFFERENTIAL (CANCER CENTER ONLY)
Abs Immature Granulocytes: 0.01 10*3/uL (ref 0.00–0.07)
Basophils Absolute: 0 10*3/uL (ref 0.0–0.1)
Basophils Relative: 0 %
Eosinophils Absolute: 0 10*3/uL (ref 0.0–0.5)
Eosinophils Relative: 1 %
HCT: 33.1 % — ABNORMAL LOW (ref 36.0–46.0)
Hemoglobin: 11 g/dL — ABNORMAL LOW (ref 12.0–15.0)
Immature Granulocytes: 0 %
Lymphocytes Relative: 12 %
Lymphs Abs: 0.7 10*3/uL (ref 0.7–4.0)
MCH: 35.1 pg — ABNORMAL HIGH (ref 26.0–34.0)
MCHC: 33.2 g/dL (ref 30.0–36.0)
MCV: 105.8 fL — ABNORMAL HIGH (ref 80.0–100.0)
Monocytes Absolute: 0.4 10*3/uL (ref 0.1–1.0)
Monocytes Relative: 7 %
Neutro Abs: 4.5 10*3/uL (ref 1.7–7.7)
Neutrophils Relative %: 80 %
Platelet Count: 266 10*3/uL (ref 150–400)
RBC: 3.13 MIL/uL — ABNORMAL LOW (ref 3.87–5.11)
RDW: 11.7 % (ref 11.5–15.5)
WBC Count: 5.6 10*3/uL (ref 4.0–10.5)
nRBC: 0 % (ref 0.0–0.2)

## 2020-10-26 ENCOUNTER — Encounter: Payer: Self-pay | Admitting: Hematology and Oncology

## 2020-10-26 ENCOUNTER — Inpatient Hospital Stay: Payer: Medicare Other | Admitting: Hematology and Oncology

## 2020-10-26 DIAGNOSIS — Z170 Estrogen receptor positive status [ER+]: Secondary | ICD-10-CM

## 2020-10-26 DIAGNOSIS — Z86718 Personal history of other venous thrombosis and embolism: Secondary | ICD-10-CM | POA: Diagnosis not present

## 2020-10-26 DIAGNOSIS — R197 Diarrhea, unspecified: Secondary | ICD-10-CM | POA: Diagnosis not present

## 2020-10-26 DIAGNOSIS — R634 Abnormal weight loss: Secondary | ICD-10-CM | POA: Diagnosis not present

## 2020-10-26 DIAGNOSIS — E876 Hypokalemia: Secondary | ICD-10-CM | POA: Diagnosis not present

## 2020-10-26 DIAGNOSIS — R5381 Other malaise: Secondary | ICD-10-CM | POA: Diagnosis not present

## 2020-10-26 DIAGNOSIS — Z853 Personal history of malignant neoplasm of breast: Secondary | ICD-10-CM | POA: Diagnosis not present

## 2020-10-26 DIAGNOSIS — C50511 Malignant neoplasm of lower-outer quadrant of right female breast: Secondary | ICD-10-CM

## 2020-10-26 DIAGNOSIS — C541 Malignant neoplasm of endometrium: Secondary | ICD-10-CM | POA: Diagnosis not present

## 2020-10-26 DIAGNOSIS — I824Z2 Acute embolism and thrombosis of unspecified deep veins of left distal lower extremity: Secondary | ICD-10-CM | POA: Diagnosis not present

## 2020-10-26 DIAGNOSIS — Z7189 Other specified counseling: Secondary | ICD-10-CM | POA: Diagnosis not present

## 2020-10-26 DIAGNOSIS — Z5112 Encounter for antineoplastic immunotherapy: Secondary | ICD-10-CM | POA: Diagnosis not present

## 2020-10-26 DIAGNOSIS — Z79899 Other long term (current) drug therapy: Secondary | ICD-10-CM | POA: Diagnosis not present

## 2020-10-26 DIAGNOSIS — N135 Crossing vessel and stricture of ureter without hydronephrosis: Secondary | ICD-10-CM | POA: Diagnosis not present

## 2020-10-26 DIAGNOSIS — G893 Neoplasm related pain (acute) (chronic): Secondary | ICD-10-CM | POA: Diagnosis not present

## 2020-10-26 DIAGNOSIS — Z7901 Long term (current) use of anticoagulants: Secondary | ICD-10-CM | POA: Diagnosis not present

## 2020-10-26 DIAGNOSIS — I700 Atherosclerosis of aorta: Secondary | ICD-10-CM | POA: Diagnosis not present

## 2020-10-26 NOTE — Assessment & Plan Note (Signed)
I had several goals of care discussion with the patient and daughter Today, she is not able to make informed decision to move forward with decision Her daughter will call me next week for update about plan of care

## 2020-10-26 NOTE — Progress Notes (Signed)
[place] OFFICE PROGRESS NOTE  Patient Care Team: [name], MD as PCP - General (Family Medicine)  ASSESSMENT & PLAN:  Endometrial cancer [facility]) The patient has very poor understanding I spoke with her daughter [name] I explained to her findings on CT imaging She is symptomatic with pain in the left pelvic region, currently well controlled with oxycodone The patient tolerated treatment very poorly in the past Moving forward, if she is interested for palliative chemotherapy, I would recommend combination of pembrolizumab with Lenvima The patient has made statements in the past that she would not want to pursue further palliative chemotherapy however, today, appears to be somewhat open minded about treatment After long discussion with the patient and her daughter, she will go home and think about it In the meantime, I recommend she document her oral intake due to recent weight loss  Breast cancer, right breast [facility]) The patient have history of breast cancer She is scheduled for diagnostic mammogram   Acute deep vein thrombosis (DVT) of distal vein of left lower extremity ([place]) She denies recent bleeding complications from Xarelto and stated she is compliant taking her medications as directed  Cancer associated pain Left hip pain is due to local cancer recurrence Currently, oxycodone is helping We discussed narcotic refill policy  Weight loss She has profound weight loss I recommend the patient to document her oral intake  Goals of care, counseling/discussion I had several goals of care discussion with the patient and daughter Today, she is not able to make informed decision to move forward with decision Her daughter will call me next week for update about plan of care   No orders of the defined types were placed in this encounter.   All questions were answered. The patient knows to call the clinic with any problems, questions or concerns. The total time  spent in the appointment was 40 minutes encounter with patients including review of chart and various tests results, discussions about plan of care and coordination of care plan   [name], MD 10/26/2020 7:42 PM  INTERVAL HISTORY: Please see below for problem oriented charting. She returns by herself for further follow-up I spoke with her daughter over the phone Since I started her on oxycodone last week for pelvic pain, that is much improved She have lost a lot of weight She could not recall her oral intake and how much water she drinks She thought she might have lost weight due to recent diarrhea She claimed that her appetite is fair She denies recent bleeding  SUMMARY OF ONCOLOGIC HISTORY: Oncology History Overview Note  ER/PR negative, MSI stable Genetic testing reported out on January 07, 2020 through the Common hereditary cancer panel found no pathogenic mutations   Breast cancer, right breast ([place])  03/13/2014 Initial Diagnosis   Breast cancer, right breast ([place])   01/07/2020 Genetic Testing   Negative genetic testing.  RAD50 c.1556C>T VUS identified.  The Common Hereditary Gene Panel offered by [facility] includes sequencing and/or deletion duplication testing of the following 48 genes: APC, ATM, AXIN2, BARD1, BMPR1A, BRCA1, BRCA2, BRIP1, CDH1, CDK4, CDKN2A (p14ARF), CDKN2A (p16INK4a), CHEK2, CTNNA1, DICER1, EPCAM (Deletion/duplication testing only), GREM1 (promoter region deletion/duplication testing only), KIT, MEN1, MLH1, MSH2, MSH3, MSH6, MUTYH, NBN, NF1, NHTL1, PALB2, PDGFRA, PMS2, POLD1, POLE, PTEN, RAD50, RAD51C, RAD51D, RNF43, SDHB, SDHC, SDHD, SMAD4, SMARCA4. STK11, TP53, TSC1, TSC2, and VHL.  The following genes were evaluated for sequence changes only: SDHA and HOXB13 c.251G>A variant only. The report date is  January 07, 2020.   Endometrial cancer ([place])  01/08/2019 Imaging   Hypermetabolic activity within central uterus, consistent with known endometrial carcinoma.   No  evidence of local or distant metastatic disease.     01/16/2019 Pathology Results   1. Lymph node, sentinel, biopsy, left obturator - NO CARCINOMA IDENTIFIED IN ONE LYMPH NODE (0/1) - SEE COMMENT 2. Lymph node, sentinel, biopsy, right obturator - NO CARCINOMA IDENTIFIED IN ONE LYMPH NODE (0/1) - SEE COMMENT 3. Uterus +/- tubes/ovaries, neoplastic, cervix, bilateral fallopian tubes and ovaries UTERUS: - ENDOMETRIOID CARCINOMA, FIGO GRADE 3, CONFINED TO THE UTERUS - SEE ONCOLOGY TABLE BELOW CERVIX: - NO CARCINOMA IDENTIFIED BILATERAL OVARIES: - NO CARCINOMA IDENTIFIED BILATERAL FALLOPIAN TUBES: - BENIGN PARATUBAL CYSTS (RIGHT) - NO CARCINOMA IDENTIFIED Microscopic Comment 1. and 2. Cytokeratin AE1/3 was performed on the sentinel lymph nodes to exclude micrometastasis. There is no evidence of metastatic carcinoma by immunohistochemistry. 3. UTERUS, CARCINOMA OR CARCINOSARCOMA Procedure: Hysterectomy and bilateral salpingo-oophorectomy Histologic type: Endometrioid carcinoma Histologic Grade: FIGO Grade 3 Myometrial invasion: Depth of invasion: 4 mm 1 ofMyometrial thickness: 27 mm Uterine Serosa Involvement: Not identified Cervical stromal involvement: Not identified Extent of involvement of other organs: N/A Lymphovascular invasion: Not identified Regional Lymph Nodes: Examined: 2 Sentinel 0 Non-sentinel 2 Total Lymph nodes with metastasis: N/A Isolated tumor cells (< 0.2 mm): 0 Micrometastasis: (> 0.2 mm and < 2.0 mm): 0 Macrometastasis: (> 2.0 mm): 0 Extracapsular extension: N/A Tumor block for ancillary studies: 3C MMR / MSI testing: Pending Pathologic Stage Classification (pTNM, AJCC 8th edition): pT1a, pN0 FIGO Stage: FIGO Ia   01/16/2019 Surgery   Surgeon: Donaciano Eva  Pre-operative Diagnosis: endometrial cancer grade 3    Operation: Robotic-assisted laparoscopic total hysterectomy with bilateral salpingoophorectomy, SLN biopsy  Operative Findings:  :  9cm uterus (mildly broadened and bulky), normal appearing fallopian tubes and ovaries.       12/15/2019 Imaging   1. Status post hysterectomy. Soft tissue nodule in the right hemipelvis measuring 3.4 x 2.6 cm. Soft tissue nodule or left pelvic sidewall/iliac lymph node conglomerate measuring 2.8 x 2.1 cm. Findings are consistent with recurrent/metastatic disease. 2. Moderate volume ascites throughout the abdomen and pelvis with fine peritoneal nodularity noted in the pelvis and new omental or ventral peritoneal soft tissue thickening, consistent with peritoneal metastatic disease. 3. Moderate right hydronephrosis and hydroureter, the distal right ureter obstructed by right pelvic soft tissue mass noted above. 4. Aortic Atherosclerosis (ICD10-I70.0).   12/22/2019 Cancer Staging   Staging form: Corpus Uteri - Carcinoma and Carcinosarcoma, AJCC 8th Edition - Clinical stage from 12/22/2019: FIGO Stage IVB (rcT1a, cN0, cM1) - Signed by [name], MD on 12/22/2019   12/24/2019 Imaging   CT chest 1. No evidence of metastatic disease in the chest. 2. Stable 3 mm pulmonary nodule of the peripheral right lower lobe, almost certainly incidental and benign. Attention on follow-up.  3. Scarring of the left lung base. 4. Aortic Atherosclerosis (ICD10-I70.0).   12/29/2019 Procedure   Preoperative diagnosis:  1. Right ureteral obstruction 2. Metastatic endometrial cancer   Postoperative diagnosis:  1. Right ureteral obstruction 2. Metastatic endometrial cancer   Procedure:   1. Cystoscopy 2. Right ureteral stent placement (6 x 24-no string)  3. Right retrograde pyelography with interpretation   Surgeon: Pryor Curia. M.D.    Intraoperative findings: Right retrograde pyelography was performed using a 6 French ureteral catheter and Omnipaque contrast.  This demonstrated severe deviation of the distal ureter laterally around the patient's  pelvic mass.  The distal ureter was clearly compressed  extrinsically without intrinsic filling defects.  The ureter was then significantly dilated above the pelvis with some tortuosity extending into a dilated renal collecting system.   12/31/2019 - 04/13/2020 Chemotherapy   The patient had carboplatin and taxol for chemotherapy treatment.     01/08/2020 Genetic Testing   Genetic testing reported out on January 07, 2020 through the Common hereditary cancer panel found no pathogenic mutations   03/01/2020 Imaging   1. Overall significant improvement, with reduced size of the pelvic adenopathy, reduced conspicuity of prior peritoneal tumor deposits, and resolution of the prior ascites. 2. Right double-J ureteral stent in place with resolution of the prior right hydronephrosis and right hydroureter. 3. Heterogeneous density in the left external iliac and common femoral vein could be from mixing of opacified and unopacified venous blood versus DVT. Consider left lower extremity Doppler venous ultrasound for further workup. 4. Other imaging findings of potential clinical significance: Mild cardiomegaly. Pancreas divisum. Lumbar spondylosis and degenerative disc disease causing multilevel impingement. Subglandular nodularity in the right breast for example on image 6 of series 2 was not hypermetabolic on prior PET-CT of 01/08/2019. 5. Emphysema and aortic atherosclerosis.     05/07/2020 Imaging   1. Small-bowel loops extend inferior to the pelvic floor along the expected course of the vagina. This is compatible with reported clinical history of vaginal cuff dehiscence. 2. Interval decrease in size of the left pelvic sidewall lymph node and soft tissue in the right pelvis. No new or progressive findings on today's exam. 3. Mild right hydronephrosis with double-J internal ureteral stent in situ. 4. Small volume intraperitoneal free fluid. 5. Aortic Atherosclerosis (ICD10-I70.0) and Emphysema (ICD10-J43.9).     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   Assistant Surgeon: none   Specimens: vaginal cuff    Operative Findings: 30cm of small intestine (ileum) eviscerated through vaginal introitus, engorged but pink. Bowel with palpable pulses in mesentery and viable appearing serosa at completion of procedure. Gaping vaginal cuff, no evidence of laceration (the cuff was smooth, non-bleeding edges, did not appear to be a traumatic dehiscence. No gross intraperitoneal disease palpable.    08/05/2020 Imaging   1. There is a new, nonspecific soft tissue nodule within the left pelvic sidewall measuring 1.1 cm. Cannot rule out recurrent tumor. 2. Interval improvement in right-sided hydronephrosis with removal of right double J nephroureteral stent. 3. Emphysema and aortic atherosclerosis.   Aortic Atherosclerosis (ICD10-I70.0) and Emphysema (ICD10-J43.9)   10/25/2020 Imaging   1. Prominence of the left ovary/adnexa versus adjacent adenopathy along the left pelvic sidewall, substantially increased from prior exam with soft tissue density in this region measuring 4.5 by 3.5 cm (formerly 1.7 by 1.3 cm on 08/05/2020). Pelvic sonography recommended for further characterization. 2. New mild nonspecific ascites. 3. Other imaging findings of potential clinical significance: Aortic Atherosclerosis (ICD10-I70.0) and Emphysema (ICD10-J43.9). Scattered stable reticulonodular peripheral interstitial accentuation in the lungs. Lower lumbar impingement.     REVIEW OF SYSTEMS:   Constitutional: Denies fevers, chills  Eyes: Denies blurriness of vision Ears, nose, mouth, throat, and face: Denies mucositis or sore throat Respiratory: Denies cough, dyspnea or wheezes Cardiovascular: Denies palpitation, chest discomfort or lower extremity swelling Skin: Denies abnormal skin rashes Lymphatics: Denies new lymphadenopathy or easy  bruising Neurological:Denies numbness, tingling or new weaknesses Behavioral/Psych: Mood is stable, no new changes  All  other systems were reviewed with the patient and are negative.  I have reviewed the past medical history, past surgical history, social history and family history with the patient and they are unchanged from previous note.  ALLERGIES:  is allergic to gadolinium and other.  MEDICATIONS:  Current Outpatient Medications  Medication Sig Dispense Refill  . acetaminophen (TYLENOL) 500 MG tablet Take 500 mg by mouth every 6 (six) hours as needed for moderate pain.     [name] alendronate (FOSAMAX) 70 MG tablet Take 70 mg by mouth once a week.     . Calcium Carb-Cholecalciferol (CALCIUM 600+D3) 600-200 MG-UNIT TABS Take 1 tablet by mouth daily.     . Capsaicin (ARTHRITIS PAIN RELIEF EX) Apply 1 application topically daily as needed (Arthritis pain).    . fluticasone (FLONASE) 50 MCG/ACT nasal spray Place 1 spray into both nostrils daily as needed for allergies or rhinitis.     . hydrocortisone cream 0.5 % Apply 1 application topically daily as needed for itching.    . levothyroxine (SYNTHROID) 88 MCG tablet Take 88 mcg by mouth every morning.    . loratadine (CLARITIN) 10 MG tablet Take 10 mg by mouth daily.    . naphazoline-pheniramine (ALLERGY EYE) 0.025-0.3 % ophthalmic solution Place 1 drop into both eyes daily as needed for allergies.    [name] oxyCODONE (OXY IR/ROXICODONE) 5 MG immediate release tablet Take 1 tablet (5 mg total) by mouth every 6 (six) hours as needed for severe pain. 30 tablet 0  . rivaroxaban (XARELTO) 20 MG TABS tablet Take 1 tablet (20 mg total) by mouth daily with supper. 30 tablet 3  . senna-docusate (SENOKOT-S) 8.6-50 MG tablet Take 2 tablets by mouth at bedtime. For AFTER surgery to prevent constipation, do not take if having diarrhea 30 tablet 0   No current facility-administered medications for this visit.    PHYSICAL EXAMINATION: ECOG PERFORMANCE STATUS: 1  - Symptomatic but completely ambulatory  Vitals:   10/26/20 1226  BP: 134/69  Pulse: (!) 110  Resp: 18  Temp: 97.8 F (36.6 C)  SpO2: 100%   Filed Weights   10/26/20 1226  Weight: 116 lb 9.6 oz (52.9 kg)    GENERAL:alert, no distress and comfortable NEURO: alert & oriented x 3 with fluent speech, no focal motor/sensory deficits  LABORATORY DATA:  I have reviewed the data as listed    Component Value Date/Time   NA 138 10/25/2020 0916   NA 142 12/08/2014 1153   K 3.4 (L) 10/25/2020 0916   K 3.7 12/08/2014 1153   CL 98 10/25/2020 0916   CO2 29 10/25/2020 0916   CO2 29 12/08/2014 1153   GLUCOSE 94 10/25/2020 0916   GLUCOSE 84 12/08/2014 1153   BUN 8 10/25/2020 0916   BUN 8.6 12/08/2014 1153   CREATININE 0.74 10/25/2020 0916   CREATININE 0.7 12/08/2014 1153   CALCIUM 10.2 10/25/2020 0916   CALCIUM 9.3 12/08/2014 1153   PROT 8.8 (H) 10/25/2020 0916   PROT 7.2 12/08/2014 1153   ALBUMIN 3.8 10/25/2020 0916   ALBUMIN 3.8 12/08/2014 1153   AST 18 10/25/2020 0916   AST 21 12/08/2014 1153   ALT 10 10/25/2020 0916   ALT 9 12/08/2014 1153   ALKPHOS 66 10/25/2020 0916   ALKPHOS 63 12/08/2014 1153   BILITOT 0.5 10/25/2020 0916   BILITOT 0.56 12/08/2014 1153   GFRNONAA >60 10/25/2020 0916   GFRAA >60 02/10/2020 0855    No results found for: SPEP, UPEP  Lab Results  Component Value Date   WBC 5.6 10/25/2020   NEUTROABS 4.5 10/25/2020   HGB 11.0 (L) 10/25/2020   HCT 33.1 (L) 10/25/2020   MCV 105.8 (H) 10/25/2020   PLT 266 10/25/2020      Chemistry      Component Value Date/Time   NA 138 10/25/2020 0916   NA 142 12/08/2014 1153   K 3.4 (L) 10/25/2020 0916   K 3.7 12/08/2014 1153   CL 98 10/25/2020 0916   CO2 29 10/25/2020 0916   CO2 29 12/08/2014 1153   BUN 8 10/25/2020 0916   BUN 8.6 12/08/2014 1153   CREATININE 0.74 10/25/2020 0916   CREATININE 0.7 12/08/2014 1153      Component Value Date/Time   CALCIUM 10.2 10/25/2020 0916   CALCIUM 9.3 12/08/2014  1153   ALKPHOS 66 10/25/2020 0916   ALKPHOS 63 12/08/2014 1153   AST 18 10/25/2020 0916   AST 21 12/08/2014 1153   ALT 10 10/25/2020 0916   ALT 9 12/08/2014 1153   BILITOT 0.5 10/25/2020 0916   BILITOT 0.56 12/08/2014 1153       RADIOGRAPHIC STUDIES: I have personally reviewed the radiological images as listed and agreed with the findings in the report. CT Abdomen Pelvis Wo Contrast  Result Date: 10/25/2020 CLINICAL DATA:  Endometrial cancer in 2020, completed radiation therapy. Prior hysterectomy. Remote history of breast cancer. Left leg pain over the last 3 weeks. EXAM: CT CHEST, ABDOMEN AND PELVIS WITHOUT CONTRAST TECHNIQUE: Multidetector CT imaging of the chest, abdomen and pelvis was performed following the standard protocol without IV contrast. COMPARISON:  Multiple exams, including 08/05/2020 and 12/24/2019 FINDINGS: CT CHEST FINDINGS Cardiovascular: Atherosclerotic calcification of the aortic arch and branch vessels. Mediastinum/Nodes: Unremarkable Lungs/Pleura: Stable biapical scarring right greater than left. Mild findings of centrilobular emphysema. Scattered peripheral reticulonodular interstitial accentuation in the lungs similar to prior. 3 mm right lower lobe nodule on image 82 series 6, no change from earliest available comparison of 01/08/2019, considered benign. Musculoskeletal: Mild degenerative glenohumeral arthropathy bilaterally. Chronic postoperative findings in the right axilla. Thoracic spondylosis and mild dextroconvex thoracic scoliosis. CT ABDOMEN PELVIS FINDINGS Hepatobiliary: Unremarkable Pancreas: Unremarkable Spleen: Unremarkable Adrenals/Urinary Tract: Unremarkable Stomach/Bowel: Unremarkable Vascular/Lymphatic: Aortoiliac atherosclerotic vascular disease. Reproductive: Substantial increase prominence of the left pelvic sidewall nodule along the left adnexa measuring 4.5 by 3.5 cm, previously about 1.7 by 1.3 cm. Other: Small amount of nonspecific ascites.  Musculoskeletal: Grade 1 degenerative anterolisthesis at L4-5. Mild levoconvex lumbar scoliosis with spondylosis and degenerative disc disease contributing to impingement at the L3-4, L4-5, and L5-S1 levels. Possible mild pelvic floor laxity. IMPRESSION: 1. Prominence of the left ovary/adnexa versus adjacent adenopathy along the left pelvic sidewall, substantially increased from prior exam with soft tissue density in this region measuring 4.5 by 3.5 cm (formerly 1.7 by 1.3 cm on 08/05/2020). Pelvic sonography recommended for further characterization. 2. New mild nonspecific ascites. 3. Other imaging findings of potential clinical significance: Aortic Atherosclerosis (ICD10-I70.0) and Emphysema (ICD10-J43.9). Scattered stable reticulonodular peripheral interstitial accentuation in the lungs. Lower lumbar impingement. Electronically Signed   By: Van Clines M.D.   On: 10/25/2020 11:53   CT Chest Wo Contrast  Result Date: 10/25/2020 CLINICAL DATA:  Endometrial cancer in 2020, completed radiation therapy. Prior hysterectomy. Remote history of breast cancer. Left leg pain over the last 3 weeks. EXAM: CT CHEST, ABDOMEN AND PELVIS WITHOUT CONTRAST TECHNIQUE: Multidetector CT imaging of the chest, abdomen and pelvis was performed following the standard protocol without IV contrast. COMPARISON:  Multiple  exams, including 08/05/2020 and 12/24/2019 FINDINGS: CT CHEST FINDINGS Cardiovascular: Atherosclerotic calcification of the aortic arch and branch vessels. Mediastinum/Nodes: Unremarkable Lungs/Pleura: Stable biapical scarring right greater than left. Mild findings of centrilobular emphysema. Scattered peripheral reticulonodular interstitial accentuation in the lungs similar to prior. 3 mm right lower lobe nodule on image 82 series 6, no change from earliest available comparison of 01/08/2019, considered benign. Musculoskeletal: Mild degenerative glenohumeral arthropathy bilaterally. Chronic postoperative findings  in the right axilla. Thoracic spondylosis and mild dextroconvex thoracic scoliosis. CT ABDOMEN PELVIS FINDINGS Hepatobiliary: Unremarkable Pancreas: Unremarkable Spleen: Unremarkable Adrenals/Urinary Tract: Unremarkable Stomach/Bowel: Unremarkable Vascular/Lymphatic: Aortoiliac atherosclerotic vascular disease. Reproductive: Substantial increase prominence of the left pelvic sidewall nodule along the left adnexa measuring 4.5 by 3.5 cm, previously about 1.7 by 1.3 cm. Other: Small amount of nonspecific ascites. Musculoskeletal: Grade 1 degenerative anterolisthesis at L4-5. Mild levoconvex lumbar scoliosis with spondylosis and degenerative disc disease contributing to impingement at the L3-4, L4-5, and L5-S1 levels. Possible mild pelvic floor laxity. IMPRESSION: 1. Prominence of the left ovary/adnexa versus adjacent adenopathy along the left pelvic sidewall, substantially increased from prior exam with soft tissue density in this region measuring 4.5 by 3.5 cm (formerly 1.7 by 1.3 cm on 08/05/2020). Pelvic sonography recommended for further characterization. 2. New mild nonspecific ascites. 3. Other imaging findings of potential clinical significance: Aortic Atherosclerosis (ICD10-I70.0) and Emphysema (ICD10-J43.9). Scattered stable reticulonodular peripheral interstitial accentuation in the lungs. Lower lumbar impingement. Electronically Signed   By: Van Clines M.D.   On: 10/25/2020 11:53   NM Renal Imaging Flow W/Pharm  Result Date: 10/05/2020 CLINICAL DATA:  Hydronephrosis. Prior RIGHT hydronephrosis with stent placement. Stent removed EXAM: NUCLEAR MEDICINE RENAL SCAN WITH DIURETIC ADMINISTRATION TECHNIQUE: Radionuclide angiographic and sequential renal images were obtained after intravenous injection of radiopharmaceutical. Imaging was continued during slow intravenous injection of Lasix approximately 15 minutes after the start of the examination. RADIOPHARMACEUTICALS:  5.3 mCi Technetium-8mMAG3 IV  COMPARISON:  CT 10/05/2020 FINDINGS: Flow:  Prompt symmetric arterial flow to the kidneys. Left renogram: Uniform uptake of counts in the renal cortex. Counts are promptly excreted into the collecting system. Lasix augment clearance from the collecting systems. No postvoid residual. Right renogram: Uniform uptake of counts in the renal cortex. Counts are promptly excreted into the collecting system. Lasix augment clearance of counts from the collecting system. No postvoid residual. Differential: Left kidney = 49 % Right kidney = 51 % T1/2 post Lasix : Left kidney = 12 min Right kidney = 11 min IMPRESSION: 1. No evidence of hydronephrosis or ureteral obstruction. 2. Normal renal function. Electronically Signed   By: SSuzy BouchardM.D.   On: 10/05/2020 16:24

## 2020-10-26 NOTE — Assessment & Plan Note (Signed)
She has profound weight loss I recommend the patient to document her oral intake

## 2020-10-26 NOTE — Assessment & Plan Note (Signed)
The patient have history of breast cancer She is scheduled for diagnostic mammogram

## 2020-10-26 NOTE — Assessment & Plan Note (Signed)
Left hip pain is due to local cancer recurrence Currently, oxycodone is helping We discussed narcotic refill policy

## 2020-10-26 NOTE — Assessment & Plan Note (Signed)
She denies recent bleeding complications from Xarelto and stated she is compliant taking her medications as directed

## 2020-10-26 NOTE — Assessment & Plan Note (Signed)
The patient has very poor understanding I spoke with her daughter Erica [name] I explained to her findings on CT imaging She is symptomatic with pain in the left pelvic region, currently well controlled with oxycodone The patient tolerated treatment very poorly in the past Moving forward, if she is interested for palliative chemotherapy, I would recommend combination of pembrolizumab with Lenvima The patient has made statements in the past that she would not want to pursue further palliative chemotherapy however, today, appears to be somewhat open minded about treatment After long discussion with the patient and her daughter, she will go home and think about it In the meantime, I recommend she document her oral intake due to recent weight loss

## 2020-11-02 ENCOUNTER — Telehealth: Payer: Self-pay

## 2020-11-02 NOTE — Telephone Encounter (Signed)
Called and given below message. Daughter will call back tomorrow or Thursday with decision. She is coming to stay with her Mom tomorrow to do a face to face decision to make sure she understands.

## 2020-11-02 NOTE — Telephone Encounter (Signed)
-----   Message from [name], MD sent at 11/02/2020  9:00 AM EDT ----- Can you call her daughter about final decision what she wants to do?

## 2020-11-03 ENCOUNTER — Telehealth: Payer: Self-pay

## 2020-11-03 NOTE — Telephone Encounter (Signed)
Pt's daughter, [name] called and LVM stating pt would like to proceed with tx. Do we need to set up appts?

## 2020-11-04 ENCOUNTER — Other Ambulatory Visit (HOSPITAL_COMMUNITY): Payer: Self-pay

## 2020-11-04 ENCOUNTER — Telehealth: Payer: Self-pay

## 2020-11-04 ENCOUNTER — Telehealth: Payer: Self-pay | Admitting: Pharmacist

## 2020-11-04 ENCOUNTER — Other Ambulatory Visit: Payer: Self-pay | Admitting: Hematology and Oncology

## 2020-11-04 DIAGNOSIS — C541 Malignant neoplasm of endometrium: Secondary | ICD-10-CM

## 2020-11-04 DIAGNOSIS — M7989 Other specified soft tissue disorders: Secondary | ICD-10-CM

## 2020-11-04 DIAGNOSIS — Z7189 Other specified counseling: Secondary | ICD-10-CM

## 2020-11-04 MED ORDER — LENVATINIB (20 MG DAILY DOSE) 2 X 10 MG PO CPPK
10.0000 mg | ORAL_CAPSULE | Freq: Every day | ORAL | 11 refills | Status: DC
  Filled 2020-11-04: qty 60, 60d supply, fill #0

## 2020-11-04 MED ORDER — LENVIMA (10 MG DAILY DOSE) 10 MG PO CPPK
10.0000 mg | ORAL_CAPSULE | Freq: Every day | ORAL | 11 refills | Status: AC
  Filled 2020-11-04: qty 30, 30d supply, fill #0

## 2020-11-04 NOTE — Telephone Encounter (Signed)
Hassan Rowan,  Pls call her daughter we will schedule appt I will get pharmacy to call daughter about Michel Santee

## 2020-11-04 NOTE — Telephone Encounter (Signed)
Called and given below message to daughter. She verbalized understanding. 

## 2020-11-04 NOTE — Telephone Encounter (Signed)
Oral Oncology Patient Advocate Encounter  After completing a benefits investigation, prior authorization for Erica [name] is not required at this time through [facility].  Patient's copay is [id number].    [place] Patient [place] Phone [phone] Fax [phone] 11/04/2020 8:50 AM

## 2020-11-04 NOTE — Progress Notes (Signed)
DISCONTINUE ON PATHWAY REGIMEN - Uterine     A cycle is every 21 days:     Paclitaxel      Carboplatin   **Always confirm dose/schedule in your pharmacy ordering system**  REASON: Disease Progression PRIOR TREATMENT: [id number]: Carboplatin AUC=6 + Paclitaxel 175 mg/m2 q21 Days Until Progression or Unacceptable Toxicity TREATMENT RESPONSE: Stable Disease (SD)  START ON PATHWAY REGIMEN - Uterine     A cycle is every 21 days:     Lenvatinib      Pembrolizumab   **Always confirm dose/schedule in your pharmacy ordering system**  Patient Characteristics: Endometrioid, Recurrent/Progressive Disease, Second Line, Systemic Recurrence, Relapse < 12 Months From Prior Therapy, MSS/pMMR and TMB-L/Unknown Histology: Endometrioid Therapeutic Status: Recurrent or Progressive Disease Line of Therapy: Second Line Time to Recurrence: Relapse < 12 Months From Prior Therapy Microsatellite/Mismatch Repair Status: MSS/pMMR Tumor Mutational Burden (TMB): TMB-L Intent of Therapy: Non-Curative / Palliative Intent, Discussed with Patient

## 2020-11-04 NOTE — Telephone Encounter (Signed)
Oral Oncology Pharmacist Encounter  Received new prescription for Lenvima (lenvatinib) for the treatment of endometrial cancer in conjunction with pembrolizumab, planned duration until disease progression or unacceptable drug toxicity.  Prescription dose and frequency assessed for appropriateness. Dose reduced per MD.  CBC w/ Diff and CMP from 10/25/20 assessed, no further dose adjustments required at this time.  Current medication list in Epic reviewed, no relevant/significant DDIs with Lenvima identified.  Evaluated chart and no patient barriers to medication adherence noted.   Patient agreement for treatment documented in MD note on 11/03/20.  Prescription has been e-scribed to the [facility] for benefits analysis and approval.  Oral Oncology Clinic will continue to follow for insurance authorization, copayment issues, initial counseling and start date.  Leron Croak, PharmD, BCPS Hematology/Oncology Clinical Pharmacist [facility] [phone] 11/04/2020 10:01 AM

## 2020-11-08 ENCOUNTER — Telehealth: Payer: Self-pay

## 2020-11-08 ENCOUNTER — Telehealth: Payer: Self-pay | Admitting: Hematology and Oncology

## 2020-11-08 NOTE — Telephone Encounter (Signed)
Scheduled per 6/16 sch msg. Called and spoke with pt confirmed 6/27 appts and also called and spoke with and confirmed appts with pt daughter [name]

## 2020-11-08 NOTE — Telephone Encounter (Signed)
Oral Oncology Patient Advocate Encounter  Met patient in lobby to complete application for [employer] in an effort to reduce patient's out of pocket expense for Lenvima to $0.    Application completed and faxed to [phone].   [employer] patient assistance phone number for follow up is [phone].   This encounter will be updated until final determination.    [place] Patient [place] Phone [phone] Fax [phone] 11/08/2020 9:45 AM

## 2020-11-09 ENCOUNTER — Other Ambulatory Visit (HOSPITAL_COMMUNITY): Payer: Self-pay

## 2020-11-10 NOTE — Telephone Encounter (Signed)
Patient is approved for Lenvima at no cost from [facility] 11/09/20-05/21/21  Ty[place] uses [place] Patient [place] Phone [phone] Fax [phone] 11/10/2020 8:53 AM

## 2020-11-11 NOTE — Telephone Encounter (Signed)
Oral Chemotherapy Pharmacist Encounter  I spoke with patient's daughter, [facility], for overview of: Lenvima (lenvatinib) for the treatment of endometrial cancer in conjunction with pembrolizumab, planned duration until disease progression or unacceptable toxicity.   Counseled on administration, dosing, side effects, monitoring, drug-food interactions, safe handling, storage, and disposal.  Patient will take Lenvima 10mg  capsules, 1 capsule (10mg ) by mouth once daily, with or without food, at approximately the same time each day.  Lenvima start date: 11/15/20  Adverse effects include but are not limited to: hypertension, hand-foot syndrome, diarrhea, joint pain, fatigue, headache, decreased calcium, proteinuria, increased risk of blood clots, and cardiac conduction issues.    Daughter stated they will obtain anti diarrheal and alert the office of 4 or more loose stools above baseline.  Instructed Ms. Houston to notify office of any upcoming invasive procedures that her mother may have.  Michel Santee will be held for 6 days prior to scheduled surgery, restart based on healing and clinical judgement.   Reviewed importance of keeping a medication schedule and plan for any missed doses. No barriers to medication adherence identified.  Medication reconciliation performed and medication/allergy list updated.  Insurance authorization for Michel Santee has been obtained. Due to high copay, patient has been enrolled and approved for manufacturer assistance to receive Lenvima at no cost. This medication will be shipped to the patient through [facility].   All questions answered.  Ms. Sherrye Payor voiced understanding and appreciation.   Medication education handout placed in mail. Daughter knows to call the office with questions or concerns. Oral Chemotherapy Clinic phone number provided.  Leron Croak, PharmD, BCPS Hematology/Oncology Clinical Pharmacist [facility] [phone] 11/11/2020 11:51 AM

## 2020-11-15 ENCOUNTER — Other Ambulatory Visit: Payer: Self-pay

## 2020-11-15 ENCOUNTER — Other Ambulatory Visit: Payer: Self-pay | Admitting: Hematology and Oncology

## 2020-11-15 ENCOUNTER — Other Ambulatory Visit (HOSPITAL_COMMUNITY): Payer: Self-pay

## 2020-11-15 ENCOUNTER — Inpatient Hospital Stay: Payer: Medicare Other

## 2020-11-15 ENCOUNTER — Encounter: Payer: Self-pay | Admitting: Hematology and Oncology

## 2020-11-15 ENCOUNTER — Telehealth: Payer: Self-pay

## 2020-11-15 ENCOUNTER — Inpatient Hospital Stay (HOSPITAL_BASED_OUTPATIENT_CLINIC_OR_DEPARTMENT_OTHER): Payer: Medicare Other | Admitting: Hematology and Oncology

## 2020-11-15 VITALS — HR 97

## 2020-11-15 VITALS — BP 153/71 | HR 103 | Temp 97.8°F | Resp 18 | Ht 62.0 in | Wt 112.4 lb

## 2020-11-15 DIAGNOSIS — M7989 Other specified soft tissue disorders: Secondary | ICD-10-CM

## 2020-11-15 DIAGNOSIS — E039 Hypothyroidism, unspecified: Secondary | ICD-10-CM | POA: Diagnosis not present

## 2020-11-15 DIAGNOSIS — C541 Malignant neoplasm of endometrium: Secondary | ICD-10-CM

## 2020-11-15 DIAGNOSIS — Z7189 Other specified counseling: Secondary | ICD-10-CM

## 2020-11-15 DIAGNOSIS — R634 Abnormal weight loss: Secondary | ICD-10-CM

## 2020-11-15 DIAGNOSIS — Z853 Personal history of malignant neoplasm of breast: Secondary | ICD-10-CM | POA: Diagnosis not present

## 2020-11-15 DIAGNOSIS — N135 Crossing vessel and stricture of ureter without hydronephrosis: Secondary | ICD-10-CM | POA: Diagnosis not present

## 2020-11-15 DIAGNOSIS — E876 Hypokalemia: Secondary | ICD-10-CM | POA: Diagnosis not present

## 2020-11-15 DIAGNOSIS — R5381 Other malaise: Secondary | ICD-10-CM | POA: Diagnosis not present

## 2020-11-15 DIAGNOSIS — Z86718 Personal history of other venous thrombosis and embolism: Secondary | ICD-10-CM | POA: Diagnosis not present

## 2020-11-15 DIAGNOSIS — R197 Diarrhea, unspecified: Secondary | ICD-10-CM | POA: Insufficient documentation

## 2020-11-15 DIAGNOSIS — G893 Neoplasm related pain (acute) (chronic): Secondary | ICD-10-CM | POA: Diagnosis not present

## 2020-11-15 DIAGNOSIS — Z7901 Long term (current) use of anticoagulants: Secondary | ICD-10-CM | POA: Diagnosis not present

## 2020-11-15 DIAGNOSIS — Z79899 Other long term (current) drug therapy: Secondary | ICD-10-CM | POA: Diagnosis not present

## 2020-11-15 DIAGNOSIS — I700 Atherosclerosis of aorta: Secondary | ICD-10-CM | POA: Diagnosis not present

## 2020-11-15 DIAGNOSIS — Z5112 Encounter for antineoplastic immunotherapy: Secondary | ICD-10-CM | POA: Diagnosis not present

## 2020-11-15 LAB — CBC WITH DIFFERENTIAL (CANCER CENTER ONLY)
Abs Immature Granulocytes: 0.01 10*3/uL (ref 0.00–0.07)
Basophils Absolute: 0 10*3/uL (ref 0.0–0.1)
Basophils Relative: 0 %
Eosinophils Absolute: 0 10*3/uL (ref 0.0–0.5)
Eosinophils Relative: 0 %
HCT: 28.3 % — ABNORMAL LOW (ref 36.0–46.0)
Hemoglobin: 9.3 g/dL — ABNORMAL LOW (ref 12.0–15.0)
Immature Granulocytes: 0 %
Lymphocytes Relative: 15 %
Lymphs Abs: 0.7 10*3/uL (ref 0.7–4.0)
MCH: 34.8 pg — ABNORMAL HIGH (ref 26.0–34.0)
MCHC: 32.9 g/dL (ref 30.0–36.0)
MCV: 106 fL — ABNORMAL HIGH (ref 80.0–100.0)
Monocytes Absolute: 0.3 10*3/uL (ref 0.1–1.0)
Monocytes Relative: 6 %
Neutro Abs: 3.6 10*3/uL (ref 1.7–7.7)
Neutrophils Relative %: 79 %
Platelet Count: 269 10*3/uL (ref 150–400)
RBC: 2.67 MIL/uL — ABNORMAL LOW (ref 3.87–5.11)
RDW: 11.8 % (ref 11.5–15.5)
WBC Count: 4.6 10*3/uL (ref 4.0–10.5)
nRBC: 0 % (ref 0.0–0.2)

## 2020-11-15 LAB — CMP (CANCER CENTER ONLY)
ALT: 6 U/L (ref 0–44)
AST: 18 U/L (ref 15–41)
Albumin: 3.3 g/dL — ABNORMAL LOW (ref 3.5–5.0)
Alkaline Phosphatase: 60 U/L (ref 38–126)
Anion gap: 12 (ref 5–15)
BUN: 11 mg/dL (ref 8–23)
CO2: 30 mmol/L (ref 22–32)
Calcium: 9.6 mg/dL (ref 8.9–10.3)
Chloride: 95 mmol/L — ABNORMAL LOW (ref 98–111)
Creatinine: 0.69 mg/dL (ref 0.44–1.00)
GFR, Estimated: >60 mL/min (ref >60)
Glucose, Bld: 95 mg/dL (ref 70–99)
Potassium: 2.8 mmol/L — ABNORMAL LOW (ref 3.5–5.1)
Sodium: 137 mmol/L (ref 135–145)
Total Bilirubin: 0.4 mg/dL (ref 0.3–1.2)
Total Protein: 8 g/dL (ref 6.5–8.1)

## 2020-11-15 LAB — TSH: TSH: 8.597 u[IU]/mL — ABNORMAL HIGH (ref 0.308–3.960)

## 2020-11-15 LAB — TOTAL PROTEIN, URINE DIPSTICK: Protein, ur: <30 mg/dL — AB

## 2020-11-15 MED ORDER — ONDANSETRON HCL 8 MG PO TABS
8.0000 mg | ORAL_TABLET | Freq: Two times a day (BID) | ORAL | 1 refills | Status: AC | PRN
  Filled 2020-11-15: qty 30, 15d supply, fill #0

## 2020-11-15 MED ORDER — SODIUM CHLORIDE 0.9 % IV SOLN
Freq: Once | INTRAVENOUS | Status: AC
Start: 2020-11-15 — End: 2020-11-15
  Filled 2020-11-15: qty 250

## 2020-11-15 MED ORDER — POTASSIUM CHLORIDE CRYS ER 20 MEQ PO TBCR: 20.0000 meq | EXTENDED_RELEASE_TABLET | Freq: Every day | ORAL | 0 refills | Status: DC

## 2020-11-15 MED ORDER — SODIUM CHLORIDE 0.9 % IV SOLN
200.0000 mg | Freq: Once | INTRAVENOUS | Status: AC
  Administered 2020-11-15: 200 mg via INTRAVENOUS
  Filled 2020-11-15: qty 8

## 2020-11-15 MED ORDER — PROCHLORPERAZINE MALEATE 10 MG PO TABS
10.0000 mg | ORAL_TABLET | Freq: Four times a day (QID) | ORAL | 1 refills | Status: AC | PRN
  Filled 2020-11-15: qty 30, 8d supply, fill #0

## 2020-11-15 NOTE — Assessment & Plan Note (Signed)
Her oral intake is poor I encouraged the patient to continue to document her oral intake I suspect her diarrhea is contributing to the weight loss Recommend the patient to increase her oral intake with frequent small meals

## 2020-11-15 NOTE — Progress Notes (Signed)
[facility] OFFICE PROGRESS NOTE  Patient Care Team: [name], MD as PCP - General (Family Medicine)  ASSESSMENT & PLAN:  Endometrial cancer [facility]) She is debilitated and forgetful She has lost weight due to poor oral intake and diarrhea She has not received her Lenvima because the pharmacist was not able to get hold of her or her daughter I have told my nursing staff to get in touch with her daughter so that she can set up delivery of medicine I will see her again in 3 weeks for further follow-up  Weight loss Her oral intake is poor I encouraged the patient to continue to document her oral intake I suspect her diarrhea is contributing to the weight loss Recommend the patient to increase her oral intake with frequent small meals  Diarrhea Her diarrhea is causing weight loss and loss of potassium I suspect she might be lactose intolerance; I recommend her to avoid dairy intake I will also check her thyroid function tests to see if that is contributing to her diarrhea  Hypokalemia due to loss of potassium I recommend potassium replacement therapy  Orders Placed This Encounter  Procedures   TSH    Standing Status:   Standing    Number of Occurrences:   22    Standing Expiration Date:   11/15/2021    All questions were answered. The patient knows to call the clinic with any problems, questions or concerns. The total time spent in the appointment was 30 minutes encounter with patients including review of chart and various tests results, discussions about plan of care and coordination of care plan   [name], MD 11/15/2020 12:37 PM  INTERVAL HISTORY: Please see below for problem oriented charting. She returns with her caregiver for further follow-up She has not received Lenvima delivery Subsequent investigation revealed that the patient has not been picking up her phone She appears somewhat forgetful She brought with her a sheet of paper which she  documented her oral intake from June 7 to June 11 Overall, her oral intake has been poor She drinks at least 1 Ensure per day She commented she had frequent diarrhea Denies recent bleeding SUMMARY OF ONCOLOGIC HISTORY: Oncology History Overview Note  ER/PR negative, MSI stable Genetic testing reported out on January 07, 2020 through the Common hereditary cancer panel found no pathogenic mutations   Breast cancer, right breast ([place])  03/13/2014 Initial Diagnosis   Breast cancer, right breast ([place])   01/07/2020 Genetic Testing   Negative genetic testing.  RAD50 c.1556C>T VUS identified.  The Common Hereditary Gene Panel offered by [facility] includes sequencing and/or deletion duplication testing of the following 48 genes: APC, ATM, AXIN2, BARD1, BMPR1A, BRCA1, BRCA2, BRIP1, CDH1, CDK4, CDKN2A (p14ARF), CDKN2A (p16INK4a), CHEK2, CTNNA1, DICER1, EPCAM (Deletion/duplication testing only), GREM1 (promoter region deletion/duplication testing only), KIT, MEN1, MLH1, MSH2, MSH3, MSH6, MUTYH, NBN, NF1, NHTL1, PALB2, PDGFRA, PMS2, POLD1, POLE, PTEN, RAD50, RAD51C, RAD51D, RNF43, SDHB, SDHC, SDHD, SMAD4, SMARCA4. STK11, TP53, TSC1, TSC2, and VHL.  The following genes were evaluated for sequence changes only: SDHA and HOXB13 c.251G>A variant only. The report date is January 07, 2020.   Endometrial cancer ([place])  01/08/2019 Imaging   Hypermetabolic activity within central uterus, consistent with known endometrial carcinoma.   No evidence of local or distant metastatic disease.     01/16/2019 Pathology Results   1. Lymph node, sentinel, biopsy, left obturator - NO CARCINOMA IDENTIFIED IN ONE LYMPH NODE (0/1) - SEE COMMENT 2. Lymph node,  sentinel, biopsy, right obturator - NO CARCINOMA IDENTIFIED IN ONE LYMPH NODE (0/1) - SEE COMMENT 3. Uterus +/- tubes/ovaries, neoplastic, cervix, bilateral fallopian tubes and ovaries UTERUS: - ENDOMETRIOID CARCINOMA, FIGO GRADE 3, CONFINED TO THE UTERUS - SEE ONCOLOGY  TABLE BELOW CERVIX: - NO CARCINOMA IDENTIFIED BILATERAL OVARIES: - NO CARCINOMA IDENTIFIED BILATERAL FALLOPIAN TUBES: - BENIGN PARATUBAL CYSTS (RIGHT) - NO CARCINOMA IDENTIFIED Microscopic Comment 1. and 2. Cytokeratin AE1/3 was performed on the sentinel lymph nodes to exclude micrometastasis. There is no evidence of metastatic carcinoma by immunohistochemistry. 3. UTERUS, CARCINOMA OR CARCINOSARCOMA Procedure: Hysterectomy and bilateral salpingo-oophorectomy Histologic type: Endometrioid carcinoma Histologic Grade: FIGO Grade 3 Myometrial invasion: Depth of invasion: 4 mm 1 ofMyometrial thickness: 27 mm Uterine Serosa Involvement: Not identified Cervical stromal involvement: Not identified Extent of involvement of other organs: N/A Lymphovascular invasion: Not identified Regional Lymph Nodes: Examined: 2 Sentinel 0 Non-sentinel 2 Total Lymph nodes with metastasis: N/A Isolated tumor cells (< 0.2 mm): 0 Micrometastasis: (> 0.2 mm and < 2.0 mm): 0 Macrometastasis: (> 2.0 mm): 0 Extracapsular extension: N/A Tumor block for ancillary studies: 3C MMR / MSI testing: Pending Pathologic Stage Classification (pTNM, AJCC 8th edition): pT1a, pN0 FIGO Stage: FIGO Ia   01/16/2019 Surgery   Surgeon: Donaciano Eva  Pre-operative Diagnosis: endometrial cancer grade 3    Operation: Robotic-assisted laparoscopic total hysterectomy with bilateral salpingoophorectomy, SLN biopsy  Operative Findings:  : 9cm uterus (mildly broadened and bulky), normal appearing fallopian tubes and ovaries.       12/15/2019 Imaging   1. Status post hysterectomy. Soft tissue nodule in the right hemipelvis measuring 3.4 x 2.6 cm. Soft tissue nodule or left pelvic sidewall/iliac lymph node conglomerate measuring 2.8 x 2.1 cm. Findings are consistent with recurrent/metastatic disease. 2. Moderate volume ascites throughout the abdomen and pelvis with fine peritoneal nodularity noted in the pelvis and new  omental or ventral peritoneal soft tissue thickening, consistent with peritoneal metastatic disease. 3. Moderate right hydronephrosis and hydroureter, the distal right ureter obstructed by right pelvic soft tissue mass noted above. 4. Aortic Atherosclerosis (ICD10-I70.0).   12/22/2019 Cancer Staging   Staging form: Corpus Uteri - Carcinoma and Carcinosarcoma, AJCC 8th Edition - Clinical stage from 12/22/2019: FIGO Stage IVB (rcT1a, cN0, cM1) - Signed by [name], MD on 12/22/2019    12/24/2019 Imaging   CT chest 1. No evidence of metastatic disease in the chest. 2. Stable 3 mm pulmonary nodule of the peripheral right lower lobe, almost certainly incidental and benign. Attention on follow-up.  3. Scarring of the left lung base. 4. Aortic Atherosclerosis (ICD10-I70.0).   12/29/2019 Procedure   Preoperative diagnosis:  Right ureteral obstruction Metastatic endometrial cancer   Postoperative diagnosis:  Right ureteral obstruction Metastatic endometrial cancer   Procedure:   Cystoscopy Right ureteral stent placement (6 x 24-no string)  Right retrograde pyelography with interpretation   Surgeon: Pryor Curia. M.D.    Intraoperative findings: Right retrograde pyelography was performed using a 6 French ureteral catheter and Omnipaque contrast.  This demonstrated severe deviation of the distal ureter laterally around the patient's pelvic mass.  The distal ureter was clearly compressed extrinsically without intrinsic filling defects.  The ureter was then significantly dilated above the pelvis with some tortuosity extending into a dilated renal collecting system.   12/31/2019 - 04/13/2020 Chemotherapy   The patient had carboplatin and taxol for chemotherapy treatment.     01/08/2020 Genetic Testing   Genetic testing reported out on January 07, 2020 through the  Common hereditary cancer panel found no pathogenic mutations   03/01/2020 Imaging   1. Overall significant improvement, with  reduced size of the pelvic adenopathy, reduced conspicuity of prior peritoneal tumor deposits, and resolution of the prior ascites. 2. Right double-J ureteral stent in place with resolution of the prior right hydronephrosis and right hydroureter. 3. Heterogeneous density in the left external iliac and common femoral vein could be from mixing of opacified and unopacified venous blood versus DVT. Consider left lower extremity Doppler venous ultrasound for further workup. 4. Other imaging findings of potential clinical significance: Mild cardiomegaly. Pancreas divisum. Lumbar spondylosis and degenerative disc disease causing multilevel impingement. Subglandular nodularity in the right breast for example on image 6 of series 2 was not hypermetabolic on prior PET-CT of 01/08/2019. 5. Emphysema and aortic atherosclerosis.     05/07/2020 Imaging   1. Small-bowel loops extend inferior to the pelvic floor along the expected course of the vagina. This is compatible with reported clinical history of vaginal cuff dehiscence. 2. Interval decrease in size of the left pelvic sidewall lymph node and soft tissue in the right pelvis. No new or progressive findings on today's exam. 3. Mild right hydronephrosis with double-J internal ureteral stent in situ. 4. Small volume intraperitoneal free fluid. 5. Aortic Atherosclerosis (ICD10-I70.0) and Emphysema (ICD10-J43.9).     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   Assistant Surgeon: none   Specimens: vaginal cuff    Operative Findings: 30cm of small intestine (ileum) eviscerated through vaginal introitus, engorged but pink. Bowel with palpable pulses in mesentery and viable appearing serosa at completion of procedure. Gaping vaginal cuff, no evidence of laceration (the cuff was smooth, non-bleeding edges, did not appear to be  a traumatic dehiscence. No gross intraperitoneal disease palpable.    08/05/2020 Imaging   1. There is a new, nonspecific soft tissue nodule within the left pelvic sidewall measuring 1.1 cm. Cannot rule out recurrent tumor. 2. Interval improvement in right-sided hydronephrosis with removal of right double J nephroureteral stent. 3. Emphysema and aortic atherosclerosis.   Aortic Atherosclerosis (ICD10-I70.0) and Emphysema (ICD10-J43.9)   10/25/2020 Imaging   1. Prominence of the left ovary/adnexa versus adjacent adenopathy along the left pelvic sidewall, substantially increased from prior exam with soft tissue density in this region measuring 4.5 by 3.5 cm (formerly 1.7 by 1.3 cm on 08/05/2020). Pelvic sonography recommended for further characterization. 2. New mild nonspecific ascites. 3. Other imaging findings of potential clinical significance: Aortic Atherosclerosis (ICD10-I70.0) and Emphysema (ICD10-J43.9). Scattered stable reticulonodular peripheral interstitial accentuation in the lungs. Lower lumbar impingement.   11/15/2020 -  Chemotherapy    Patient is on Treatment Plan: UTERINE LENVATINIB + PEMBROLIZUMAB Q21D         REVIEW OF SYSTEMS:   Constitutional: Denies fevers, chills  Eyes: Denies blurriness of vision Ears, nose, mouth, throat, and face: Denies mucositis or sore throat Respiratory: Denies cough, dyspnea or wheezes Cardiovascular: Denies palpitation, chest discomfort or lower extremity swelling Skin: Denies abnormal skin rashes Lymphatics: Denies new lymphadenopathy or easy bruising Neurological:Denies numbness, tingling or new weaknesses Behavioral/Psych: Mood is stable, no new changes  All other systems were reviewed with the patient and are negative.  I have reviewed the past medical history, past surgical history, social history and family history with the patient and they are unchanged from previous note.  ALLERGIES:  is allergic to gadolinium and  other.  MEDICATIONS:  Current Outpatient Medications  Medication Sig Dispense Refill   potassium chloride SA (KLOR-CON) 20 MEQ tablet Take 1 tablet (20 mEq total) by mouth daily. 30 tablet 0   acetaminophen (TYLENOL) 500 MG tablet Take 500 mg by mouth every 6 (six) hours as needed for moderate pain.      alendronate (FOSAMAX) 70 MG tablet Take 70 mg by mouth once a week.      Calcium Carb-Cholecalciferol (CALCIUM 600+D3) 600-200 MG-UNIT TABS Take 1 tablet by mouth daily.      Capsaicin (ARTHRITIS PAIN RELIEF EX) Apply 1 application topically daily as needed (Arthritis pain).     fluticasone (FLONASE) 50 MCG/ACT nasal spray Place 1 spray into both nostrils daily as needed for allergies or rhinitis.      hydrocortisone cream 0.5 % Apply 1 application topically daily as needed for itching.     lenvatinib 10 mg daily dose (LENVIMA, 10 MG DAILY DOSE,) capsule Take 1 capsule (10 mg total) by mouth daily. 30 capsule 11   levothyroxine (SYNTHROID) 88 MCG tablet Take 88 mcg by mouth every morning.     loratadine (CLARITIN) 10 MG tablet Take 10 mg by mouth daily.     naphazoline-pheniramine (ALLERGY EYE) 0.025-0.3 % ophthalmic solution Place 1 drop into both eyes daily as needed for allergies.     ondansetron (ZOFRAN) 8 MG tablet Take 1 tablet (8 mg total) by mouth 2 (two) times daily as needed (Nausea or vomiting). 30 tablet 1   oxyCODONE (OXY IR/ROXICODONE) 5 MG immediate release tablet Take 1 tablet (5 mg total) by mouth every 6 (six) hours as needed for severe pain. 30 tablet 0   prochlorperazine (COMPAZINE) 10 MG tablet Take 1 tablet (10 mg total) by mouth every 6 (six) hours as needed (Nausea or vomiting). 30 tablet 1   rivaroxaban (XARELTO) 20 MG TABS tablet Take 1 tablet (20 mg total) by mouth daily with supper. 30 tablet 3   No current facility-administered medications for this visit.    PHYSICAL EXAMINATION: ECOG PERFORMANCE STATUS: 2 - Symptomatic, <50% confined to bed  Vitals:    11/15/20 1206  BP: (!) 153/71  Pulse: (!) 103  Resp: 18  Temp: 97.8 F (36.6 C)  SpO2: 100%   Filed Weights   11/15/20 1206  Weight: 112 lb 6.4 oz (51 kg)    GENERAL:alert, no distress and comfortable.  She looks thin and cachectic Musculoskeletal:no cyanosis of digits and no clubbing  NEURO: alert & oriented x 3 with fluent speech, no focal motor/sensory deficits  LABORATORY DATA:  I have reviewed the data as listed    Component Value Date/Time   NA 137 11/15/2020 1133   NA 142 12/08/2014 1153   K 2.8 (L) 11/15/2020 1133   K 3.7 12/08/2014 1153   CL 95 (L) 11/15/2020 1133   CO2 30 11/15/2020 1133   CO2 29 12/08/2014 1153   GLUCOSE 95 11/15/2020 1133   GLUCOSE 84 12/08/2014 1153   BUN 11 11/15/2020 1133   BUN 8.6 12/08/2014 1153   CREATININE 0.69 11/15/2020 1133   CREATININE 0.7 12/08/2014 1153   CALCIUM 9.6 11/15/2020 1133   CALCIUM 9.3 12/08/2014 1153   PROT 8.0 11/15/2020 1133   PROT 7.2 12/08/2014 1153   ALBUMIN 3.3 (L) 11/15/2020 1133   ALBUMIN 3.8 12/08/2014 1153   AST 18 11/15/2020 1133   AST 21 12/08/2014 1153   ALT 6 11/15/2020 1133   ALT 9 12/08/2014 1153   ALKPHOS 60 11/15/2020 1133  ALKPHOS 63 12/08/2014 1153   BILITOT 0.4 11/15/2020 1133   BILITOT 0.56 12/08/2014 1153   GFRNONAA >60 11/15/2020 1133   GFRAA >60 02/10/2020 0855    No results found for: SPEP, UPEP  Lab Results  Component Value Date   WBC 4.6 11/15/2020   NEUTROABS 3.6 11/15/2020   HGB 9.3 (L) 11/15/2020   HCT 28.3 (L) 11/15/2020   MCV 106.0 (H) 11/15/2020   PLT 269 11/15/2020      Chemistry      Component Value Date/Time   NA 137 11/15/2020 1133   NA 142 12/08/2014 1153   K 2.8 (L) 11/15/2020 1133   K 3.7 12/08/2014 1153   CL 95 (L) 11/15/2020 1133   CO2 30 11/15/2020 1133   CO2 29 12/08/2014 1153   BUN 11 11/15/2020 1133   BUN 8.6 12/08/2014 1153   CREATININE 0.69 11/15/2020 1133   CREATININE 0.7 12/08/2014 1153      Component Value Date/Time   CALCIUM 9.6  11/15/2020 1133   CALCIUM 9.3 12/08/2014 1153   ALKPHOS 60 11/15/2020 1133   ALKPHOS 63 12/08/2014 1153   AST 18 11/15/2020 1133   AST 21 12/08/2014 1153   ALT 6 11/15/2020 1133   ALT 9 12/08/2014 1153   BILITOT 0.4 11/15/2020 1133   BILITOT 0.56 12/08/2014 1153

## 2020-11-15 NOTE — Telephone Encounter (Signed)
Called and left a message for Tammy to call the office back.

## 2020-11-15 NOTE — Telephone Encounter (Signed)
Erica [name], daughter. Given phone # [phone] to call and set up delivery of Lenvima. She will call today to set up delivery.   Given potassium results and told to start Potassium daily. Prescription sent to pharmacy. She verbalized understanding.  Verified with daughter, if [name] is taking Synthroid 88 mcg daily. [name] is taking the Synthroid Rx.  Tammy will be coming to get her Mom and bring her back to [place] to stay with her until the next treatment in a few days.  Told Tammy the office will call her back in am.

## 2020-11-15 NOTE — Assessment & Plan Note (Signed)
I recommend potassium replacement therapy

## 2020-11-15 NOTE — Patient Instructions (Signed)
[facility]  Discharge Instructions: Thank you for choosing [facility] to provide your oncology and hematology care.   If you have a lab appointment with the Cancer Center, please go directly to the Cancer Center and check in at the registration area.   Wear comfortable clothing and clothing appropriate for easy access to any Portacath or PICC line.   We strive to give you quality time with your provider. You may need to reschedule your appointment if you arrive late (15 or more minutes).  Arriving late affects you and other patients whose appointments are after yours.  Also, if you miss three or more appointments without notifying the office, you may be dismissed from the clinic at the provider's discretion.      For prescription refill requests, have your pharmacy contact our office and allow 72 hours for refills to be completed.    Today you received the following chemotherapy and/or immunotherapy agents Keytruda      To help prevent nausea and vomiting after your treatment, we encourage you to take your nausea medication as directed.  BELOW ARE SYMPTOMS THAT SHOULD BE REPORTED IMMEDIATELY: *FEVER GREATER THAN 100.4 F (38 C) OR HIGHER *CHILLS OR SWEATING *NAUSEA AND VOMITING THAT IS NOT CONTROLLED WITH YOUR NAUSEA MEDICATION *UNUSUAL SHORTNESS OF BREATH *UNUSUAL BRUISING OR BLEEDING *URINARY PROBLEMS (pain or burning when urinating, or frequent urination) *BOWEL PROBLEMS (unusual diarrhea, constipation, pain near the anus) TENDERNESS IN MOUTH AND THROAT WITH OR WITHOUT PRESENCE OF ULCERS (sore throat, sores in mouth, or a toothache) UNUSUAL RASH, SWELLING OR PAIN  UNUSUAL VAGINAL DISCHARGE OR ITCHING   Items with * indicate a potential emergency and should be followed up as soon as possible or go to the Emergency Department if any problems should occur.  Please show the CHEMOTHERAPY ALERT CARD or IMMUNOTHERAPY ALERT CARD at check-in to  the Emergency Department and triage nurse.  Should you have questions after your visit or need to cancel or reschedule your appointment, please contact [facility]  Dept: [phone]  and follow the prompts.  Office hours are 8:00 a.m. to 4:30 p.m. Monday - Friday. Please note that voicemails left after 4:00 p.m. may not be returned until the following business day.  We are closed weekends and major holidays. You have access to a nurse at all times for urgent questions. Please call the main number to the clinic Dept: [phone] and follow the prompts.   For any non-urgent questions, you may also contact your provider using MyChart. We now offer e-Visits for anyone 18 and older to request care online for non-urgent symptoms. For details visit [email or web address].   Also download the MyChart app! Go to the app store, search "MyChart", open the app, select , and log in with your MyChart username and password.  Due to Covid, a mask is required upon entering the hospital/clinic. If you do not have a mask, one will be given to you upon arrival. For doctor visits, patients may have 1 support person aged 18 or older with them. For treatment visits, patients cannot have anyone with them due to current Covid guidelines and our immunocompromised population.   Pembrolizumab injection What is this medication? PEMBROLIZUMAB (pem broe liz ue mab) is a monoclonal antibody. It is used totreat certain types of cancer. This medicine may be used for other purposes; ask your health care provider orpharmacist if you have questions. COMMON BRAND NAME(S): Keytruda What should I   tell my care team before I take this medication? They need to know if you have any of these conditions: autoimmune diseases like Crohn's disease, ulcerative colitis, or lupus have had or planning to have an allogeneic stem cell transplant (uses someone else's stem cells) history of organ  transplant history of chest radiation nervous system problems like myasthenia gravis or Guillain-Barre syndrome an unusual or allergic reaction to pembrolizumab, other medicines, foods, dyes, or preservatives pregnant or trying to get pregnant breast-feeding How should I use this medication? This medicine is for infusion into a vein. It is given by a health careprofessional in a hospital or clinic setting. A special MedGuide will be given to you before each treatment. Be sure to readthis information carefully each time. Talk to your pediatrician regarding the use of this medicine in children. While this drug may be prescribed for children as young as 6 months for selectedconditions, precautions do apply. Overdosage: If you think you have taken too much of this medicine contact apoison control center or emergency room at once. NOTE: This medicine is only for you. Do not share this medicine with others. What if I miss a dose? It is important not to miss your dose. Call your doctor or health careprofessional if you are unable to keep an appointment. What may interact with this medication? Interactions have not been studied. This list may not describe all possible interactions. Give your health care provider a list of all the medicines, herbs, non-prescription drugs, or dietary supplements you use. Also tell them if you smoke, drink alcohol, or use illegaldrugs. Some items may interact with your medicine. What should I watch for while using this medication? Your condition will be monitored carefully while you are receiving thismedicine. You may need blood work done while you are taking this medicine. Do not become pregnant while taking this medicine or for 4 months after stopping it. Women should inform their doctor if they wish to become pregnant or think they might be pregnant. There is a potential for serious side effects to an unborn child. Talk to your health care professional or pharmacist for  more information. Do not breast-feed an infant while taking this medicine orfor 4 months after the last dose. What side effects may I notice from receiving this medication? Side effects that you should report to your doctor or health care professionalas soon as possible: allergic reactions like skin rash, itching or hives, swelling of the face, lips, or tongue bloody or black, tarry breathing problems changes in vision chest pain chills confusion constipation cough diarrhea dizziness or feeling faint or lightheaded fast or irregular heartbeat fever flushing joint pain low blood counts - this medicine may decrease the number of white blood cells, red blood cells and platelets. You may be at increased risk for infections and bleeding. muscle pain muscle weakness pain, tingling, numbness in the hands or feet persistent headache redness, blistering, peeling or loosening of the skin, including inside the mouth signs and symptoms of high blood sugar such as dizziness; dry mouth; dry skin; fruity breath; nausea; stomach pain; increased hunger or thirst; increased urination signs and symptoms of kidney injury like trouble passing urine or change in the amount of urine signs and symptoms of liver injury like dark urine, light-colored stools, loss of appetite, nausea, right upper belly pain, yellowing of the eyes or skin sweating swollen lymph nodes weight loss Side effects that usually do not require medical attention (report to yourdoctor or health care professional if they continue   or are bothersome): decreased appetite hair loss tiredness This list may not describe all possible side effects. Call your doctor for medical advice about side effects. You may report side effects to FDA at1-800-FDA-1088. Where should I keep my medication? This drug is given in a hospital or clinic and will not be stored at home. NOTE: This sheet is a summary. It may not cover all possible information. If you  have questions about this medicine, talk to your doctor, pharmacist, orhealth care provider.  2022 Elsevier/Gold Standard (2019-04-09 21:44:53)   

## 2020-11-15 NOTE — Assessment & Plan Note (Signed)
She is debilitated and forgetful She has lost weight due to poor oral intake and diarrhea She has not received her Lenvima because the pharmacist was not able to get hold of her or her daughter I have told my nursing staff to get in touch with her daughter so that she can set up delivery of medicine I will see her again in 3 weeks for further follow-up

## 2020-11-15 NOTE — Assessment & Plan Note (Addendum)
Her diarrhea is causing weight loss and loss of potassium I suspect she might be lactose intolerance; I recommend her to avoid dairy intake I will also check her thyroid function tests to see if that is contributing to her diarrhea

## 2020-11-16 ENCOUNTER — Other Ambulatory Visit: Payer: Self-pay | Admitting: Hematology and Oncology

## 2020-11-16 LAB — T4: T4, Total: 10.6 ug/dL (ref 4.5–12.0)

## 2020-11-16 MED ORDER — LEVOTHYROXINE SODIUM 100 MCG PO TABS: 100.0000 ug | ORAL_TABLET | Freq: Every morning | ORAL | 3 refills | Status: DC

## 2020-11-16 NOTE — Telephone Encounter (Signed)
Called and given below message to daughter. Rx sent to [facility]. She verbalized understanding. Michel Santee will be delivered tomorrow. Ask her to call the office back for questions.

## 2020-11-16 NOTE — Telephone Encounter (Signed)
Pls tell daughter I refill synthroid at higher dose to her pharmacy and removed fosamax off her med list

## 2020-11-24 ENCOUNTER — Other Ambulatory Visit (HOSPITAL_COMMUNITY): Payer: Self-pay

## 2020-11-29 ENCOUNTER — Other Ambulatory Visit: Payer: Medicare Other

## 2020-12-06 ENCOUNTER — Other Ambulatory Visit: Payer: Self-pay

## 2020-12-06 ENCOUNTER — Encounter: Payer: Self-pay | Admitting: Hematology and Oncology

## 2020-12-06 ENCOUNTER — Inpatient Hospital Stay: Payer: Medicare Other

## 2020-12-06 ENCOUNTER — Inpatient Hospital Stay: Payer: Medicare Other | Attending: Hematology and Oncology

## 2020-12-06 ENCOUNTER — Telehealth: Payer: Self-pay

## 2020-12-06 ENCOUNTER — Inpatient Hospital Stay: Payer: Medicare Other | Admitting: Hematology and Oncology

## 2020-12-06 VITALS — HR 89

## 2020-12-06 DIAGNOSIS — G893 Neoplasm related pain (acute) (chronic): Secondary | ICD-10-CM

## 2020-12-06 DIAGNOSIS — Z79899 Other long term (current) drug therapy: Secondary | ICD-10-CM | POA: Insufficient documentation

## 2020-12-06 DIAGNOSIS — R600 Localized edema: Secondary | ICD-10-CM | POA: Diagnosis not present

## 2020-12-06 DIAGNOSIS — C541 Malignant neoplasm of endometrium: Secondary | ICD-10-CM | POA: Diagnosis present

## 2020-12-06 DIAGNOSIS — I700 Atherosclerosis of aorta: Secondary | ICD-10-CM | POA: Diagnosis not present

## 2020-12-06 DIAGNOSIS — E878 Other disorders of electrolyte and fluid balance, not elsewhere classified: Secondary | ICD-10-CM | POA: Diagnosis not present

## 2020-12-06 DIAGNOSIS — N135 Crossing vessel and stricture of ureter without hydronephrosis: Secondary | ICD-10-CM | POA: Diagnosis not present

## 2020-12-06 DIAGNOSIS — Z7189 Other specified counseling: Secondary | ICD-10-CM

## 2020-12-06 DIAGNOSIS — M7989 Other specified soft tissue disorders: Secondary | ICD-10-CM

## 2020-12-06 DIAGNOSIS — E039 Hypothyroidism, unspecified: Secondary | ICD-10-CM

## 2020-12-06 DIAGNOSIS — R197 Diarrhea, unspecified: Secondary | ICD-10-CM

## 2020-12-06 DIAGNOSIS — R531 Weakness: Secondary | ICD-10-CM | POA: Insufficient documentation

## 2020-12-06 DIAGNOSIS — Z5112 Encounter for antineoplastic immunotherapy: Secondary | ICD-10-CM | POA: Insufficient documentation

## 2020-12-06 DIAGNOSIS — Z7901 Long term (current) use of anticoagulants: Secondary | ICD-10-CM | POA: Diagnosis not present

## 2020-12-06 DIAGNOSIS — E46 Unspecified protein-calorie malnutrition: Secondary | ICD-10-CM | POA: Insufficient documentation

## 2020-12-06 LAB — TOTAL PROTEIN, URINE DIPSTICK: Protein, ur: <30 mg/dL

## 2020-12-06 LAB — CBC WITH DIFFERENTIAL (CANCER CENTER ONLY)
Abs Immature Granulocytes: 0.01 10*3/uL (ref 0.00–0.07)
Basophils Absolute: 0 10*3/uL (ref 0.0–0.1)
Basophils Relative: 1 %
Eosinophils Absolute: 0 10*3/uL (ref 0.0–0.5)
Eosinophils Relative: 0 %
HCT: 27.6 % — ABNORMAL LOW (ref 36.0–46.0)
Hemoglobin: 9.1 g/dL — ABNORMAL LOW (ref 12.0–15.0)
Immature Granulocytes: 0 %
Lymphocytes Relative: 14 %
Lymphs Abs: 0.8 10*3/uL (ref 0.7–4.0)
MCH: 34.5 pg — ABNORMAL HIGH (ref 26.0–34.0)
MCHC: 33 g/dL (ref 30.0–36.0)
MCV: 104.5 fL — ABNORMAL HIGH (ref 80.0–100.0)
Monocytes Absolute: 0.3 10*3/uL (ref 0.1–1.0)
Monocytes Relative: 5 %
Neutro Abs: 4.2 10*3/uL (ref 1.7–7.7)
Neutrophils Relative %: 80 %
Platelet Count: 324 10*3/uL (ref 150–400)
RBC: 2.64 MIL/uL — ABNORMAL LOW (ref 3.87–5.11)
RDW: 12.3 % (ref 11.5–15.5)
WBC Count: 5.3 10*3/uL (ref 4.0–10.5)
nRBC: 0 % (ref 0.0–0.2)

## 2020-12-06 LAB — CMP (CANCER CENTER ONLY)
ALT: <6 U/L (ref 0–44)
AST: 14 U/L — ABNORMAL LOW (ref 15–41)
Albumin: 2.8 g/dL — ABNORMAL LOW (ref 3.5–5.0)
Alkaline Phosphatase: 54 U/L (ref 38–126)
Anion gap: 11 (ref 5–15)
BUN: 10 mg/dL (ref 8–23)
CO2: 32 mmol/L (ref 22–32)
Calcium: 9.1 mg/dL (ref 8.9–10.3)
Chloride: 104 mmol/L (ref 98–111)
Creatinine: 0.68 mg/dL (ref 0.44–1.00)
GFR, Estimated: >60 mL/min (ref >60)
Glucose, Bld: 99 mg/dL (ref 70–99)
Potassium: 2.9 mmol/L — ABNORMAL LOW (ref 3.5–5.1)
Sodium: 147 mmol/L — ABNORMAL HIGH (ref 135–145)
Total Bilirubin: 0.3 mg/dL (ref 0.3–1.2)
Total Protein: 7.6 g/dL (ref 6.5–8.1)

## 2020-12-06 LAB — TSH: TSH: 11.051 u[IU]/mL — ABNORMAL HIGH (ref 0.308–3.960)

## 2020-12-06 MED ORDER — SODIUM CHLORIDE 0.9 % IV SOLN
200.0000 mg | Freq: Once | INTRAVENOUS | Status: AC
  Administered 2020-12-06: 200 mg via INTRAVENOUS
  Filled 2020-12-06: qty 8

## 2020-12-06 MED ORDER — SODIUM CHLORIDE 0.9 % IV SOLN
Freq: Once | INTRAVENOUS | Status: DC
  Filled 2020-12-06: qty 250

## 2020-12-06 MED ORDER — LEVOTHYROXINE SODIUM 125 MCG PO TABS: 125.0000 ug | ORAL_TABLET | Freq: Every day | ORAL | 3 refills | Status: DC

## 2020-12-06 NOTE — Assessment & Plan Note (Signed)
She is weak and frail The patient have difficulties verbalizing her symptoms I would not volunteer information unless asked Apparently, despite no recent weight loss, she is worse with pain, nausea, vomiting and diarrhea Her labs are worse due to malnutrition, with electrolyte imbalance due to diarrhea and others We discussed the risk and benefits of continuing treatment I will get my nursing staff to call her tomorrow to check on her Today, she would like to proceed with treatment as scheduled

## 2020-12-06 NOTE — Assessment & Plan Note (Signed)
She has significant diarrhea but is not taking Imodium The patient seems to have trouble understanding the importance of taking medication as directed I will get my nursing staff to call her tomorrow to check on her

## 2020-12-06 NOTE — Telephone Encounter (Signed)
Called and given below message to daughter. She verbalized understanding. Her Mom is taking the synthroid and taking it correctly. New Rx sent to preferred pharmacy.

## 2020-12-06 NOTE — Telephone Encounter (Signed)
-----   Message from [name], MD sent at 12/06/2020  2:28 PM EDT ----- Can you check and ask if her daughter is around? Can you ask her if her mom is taking the synthroid? If taking, then we need to increase to 125 mcg. If not taking, we will not change Make sure she takes 30 mins in the morning before meals

## 2020-12-06 NOTE — Progress Notes (Signed)
[place] OFFICE PROGRESS NOTE  Patient Care Team: [name], MD as PCP - General (Family Medicine)  ASSESSMENT & PLAN:  Endometrial cancer [facility]) She is weak and frail The patient have difficulties verbalizing her symptoms I would not volunteer information unless asked Apparently, despite no recent weight loss, she is worse with pain, nausea, vomiting and diarrhea Her labs are worse due to malnutrition, with electrolyte imbalance due to diarrhea and others We discussed the risk and benefits of continuing treatment I will get my nursing staff to call her tomorrow to check on her Today, she would like to proceed with treatment as scheduled  Cancer associated pain Left hip pain is due to local cancer recurrence Currently, oxycodone is helping However, she is not taking her pain medicine as directed I encouraged her to increase intake of oxycodone as needed We discussed narcotic refill policy  Diarrhea She has significant diarrhea but is not taking Imodium The patient seems to have trouble understanding the importance of taking medication as directed I will get my nursing staff to call her tomorrow to check on her  Left leg swelling She has bilateral lower extremity edema, left greater than right Her serum albumin is low I suspect this is due to third spacing She will continue her anticoagulation therapy  No orders of the defined types were placed in this encounter.   All questions were answered. The patient knows to call the clinic with any problems, questions or concerns. The total time spent in the appointment was 30 minutes encounter with patients including review of chart and various tests results, discussions about plan of care and coordination of care plan   [name], MD 12/06/2020 1:37 PM  INTERVAL HISTORY: Please see below for problem oriented charting. She returns with her daughter for further follow-up Her daughter informed me that she had  been having nausea, vomiting, diarrhea to the point of incontinence and persistent pain Even though she has not lost any weight, she felt weak Her daughter is concerned about risk of falls When I inquire whether she is taking her pain medicine, she only took 3 pills in the last 3 weeks She only took some Imodium recently but not on a consistent basis despite significant diarrhea I was not aware that she had nausea and vomiting  SUMMARY OF ONCOLOGIC HISTORY: Oncology History Overview Note  ER/PR negative, MSI stable Genetic testing reported out on January 07, 2020 through the Common hereditary cancer panel found no pathogenic mutations   Breast cancer, right breast ([place])  03/13/2014 Initial Diagnosis   Breast cancer, right breast ([place])   01/07/2020 Genetic Testing   Negative genetic testing.  RAD50 c.1556C>T VUS identified.  The Common Hereditary Gene Panel offered by [facility] includes sequencing and/or deletion duplication testing of the following 48 genes: APC, ATM, AXIN2, BARD1, BMPR1A, BRCA1, BRCA2, BRIP1, CDH1, CDK4, CDKN2A (p14ARF), CDKN2A (p16INK4a), CHEK2, CTNNA1, DICER1, EPCAM (Deletion/duplication testing only), GREM1 (promoter region deletion/duplication testing only), KIT, MEN1, MLH1, MSH2, MSH3, MSH6, MUTYH, NBN, NF1, NHTL1, PALB2, PDGFRA, PMS2, POLD1, POLE, PTEN, RAD50, RAD51C, RAD51D, RNF43, SDHB, SDHC, SDHD, SMAD4, SMARCA4. STK11, TP53, TSC1, TSC2, and VHL.  The following genes were evaluated for sequence changes only: SDHA and HOXB13 c.251G>A variant only. The report date is January 07, 2020.   Endometrial cancer ([place])  01/08/2019 Imaging   Hypermetabolic activity within central uterus, consistent with known endometrial carcinoma.   No evidence of local or distant metastatic disease.     01/16/2019 Pathology Results  1. Lymph node, sentinel, biopsy, left obturator - NO CARCINOMA IDENTIFIED IN ONE LYMPH NODE (0/1) - SEE COMMENT 2. Lymph node, sentinel, biopsy, right  obturator - NO CARCINOMA IDENTIFIED IN ONE LYMPH NODE (0/1) - SEE COMMENT 3. Uterus +/- tubes/ovaries, neoplastic, cervix, bilateral fallopian tubes and ovaries UTERUS: - ENDOMETRIOID CARCINOMA, FIGO GRADE 3, CONFINED TO THE UTERUS - SEE ONCOLOGY TABLE BELOW CERVIX: - NO CARCINOMA IDENTIFIED BILATERAL OVARIES: - NO CARCINOMA IDENTIFIED BILATERAL FALLOPIAN TUBES: - BENIGN PARATUBAL CYSTS (RIGHT) - NO CARCINOMA IDENTIFIED Microscopic Comment 1. and 2. Cytokeratin AE1/3 was performed on the sentinel lymph nodes to exclude micrometastasis. There is no evidence of metastatic carcinoma by immunohistochemistry. 3. UTERUS, CARCINOMA OR CARCINOSARCOMA Procedure: Hysterectomy and bilateral salpingo-oophorectomy Histologic type: Endometrioid carcinoma Histologic Grade: FIGO Grade 3 Myometrial invasion: Depth of invasion: 4 mm 1 ofMyometrial thickness: 27 mm Uterine Serosa Involvement: Not identified Cervical stromal involvement: Not identified Extent of involvement of other organs: N/A Lymphovascular invasion: Not identified Regional Lymph Nodes: Examined: 2 Sentinel 0 Non-sentinel 2 Total Lymph nodes with metastasis: N/A Isolated tumor cells (< 0.2 mm): 0 Micrometastasis: (> 0.2 mm and < 2.0 mm): 0 Macrometastasis: (> 2.0 mm): 0 Extracapsular extension: N/A Tumor block for ancillary studies: 3C MMR / MSI testing: Pending Pathologic Stage Classification (pTNM, AJCC 8th edition): pT1a, pN0 FIGO Stage: FIGO Ia   01/16/2019 Surgery   Surgeon: Donaciano Eva  Pre-operative Diagnosis: endometrial cancer grade 3    Operation: Robotic-assisted laparoscopic total hysterectomy with bilateral salpingoophorectomy, SLN biopsy  Operative Findings:  : 9cm uterus (mildly broadened and bulky), normal appearing fallopian tubes and ovaries.       12/15/2019 Imaging   1. Status post hysterectomy. Soft tissue nodule in the right hemipelvis measuring 3.4 x 2.6 cm. Soft tissue nodule or left  pelvic sidewall/iliac lymph node conglomerate measuring 2.8 x 2.1 cm. Findings are consistent with recurrent/metastatic disease. 2. Moderate volume ascites throughout the abdomen and pelvis with fine peritoneal nodularity noted in the pelvis and new omental or ventral peritoneal soft tissue thickening, consistent with peritoneal metastatic disease. 3. Moderate right hydronephrosis and hydroureter, the distal right ureter obstructed by right pelvic soft tissue mass noted above. 4. Aortic Atherosclerosis (ICD10-I70.0).   12/22/2019 Cancer Staging   Staging form: Corpus Uteri - Carcinoma and Carcinosarcoma, AJCC 8th Edition - Clinical stage from 12/22/2019: FIGO Stage IVB (rcT1a, cN0, cM1) - Signed by [name], MD on 12/22/2019    12/24/2019 Imaging   CT chest 1. No evidence of metastatic disease in the chest. 2. Stable 3 mm pulmonary nodule of the peripheral right lower lobe, almost certainly incidental and benign. Attention on follow-up.  3. Scarring of the left lung base. 4. Aortic Atherosclerosis (ICD10-I70.0).   12/29/2019 Procedure   Preoperative diagnosis:  Right ureteral obstruction Metastatic endometrial cancer   Postoperative diagnosis:  Right ureteral obstruction Metastatic endometrial cancer   Procedure:   Cystoscopy Right ureteral stent placement (6 x 24-no string)  Right retrograde pyelography with interpretation   Surgeon: Pryor Curia. M.D.    Intraoperative findings: Right retrograde pyelography was performed using a 6 French ureteral catheter and Omnipaque contrast.  This demonstrated severe deviation of the distal ureter laterally around the patient's pelvic mass.  The distal ureter was clearly compressed extrinsically without intrinsic filling defects.  The ureter was then significantly dilated above the pelvis with some tortuosity extending into a dilated renal collecting system.   12/31/2019 - 04/13/2020 Chemotherapy   The patient had carboplatin and taxol  for  chemotherapy treatment.     01/08/2020 Genetic Testing   Genetic testing reported out on January 07, 2020 through the Common hereditary cancer panel found no pathogenic mutations   03/01/2020 Imaging   1. Overall significant improvement, with reduced size of the pelvic adenopathy, reduced conspicuity of prior peritoneal tumor deposits, and resolution of the prior ascites. 2. Right double-J ureteral stent in place with resolution of the prior right hydronephrosis and right hydroureter. 3. Heterogeneous density in the left external iliac and common femoral vein could be from mixing of opacified and unopacified venous blood versus DVT. Consider left lower extremity Doppler venous ultrasound for further workup. 4. Other imaging findings of potential clinical significance: Mild cardiomegaly. Pancreas divisum. Lumbar spondylosis and degenerative disc disease causing multilevel impingement. Subglandular nodularity in the right breast for example on image 6 of series 2 was not hypermetabolic on prior PET-CT of 01/08/2019. 5. Emphysema and aortic atherosclerosis.     05/07/2020 Imaging   1. Small-bowel loops extend inferior to the pelvic floor along the expected course of the vagina. This is compatible with reported clinical history of vaginal cuff dehiscence. 2. Interval decrease in size of the left pelvic sidewall lymph node and soft tissue in the right pelvis. No new or progressive findings on today's exam. 3. Mild right hydronephrosis with double-J internal ureteral stent in situ. 4. Small volume intraperitoneal free fluid. 5. Aortic Atherosclerosis (ICD10-I70.0) and Emphysema (ICD10-J43.9).     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   Assistant Surgeon: none   Specimens: vaginal cuff    Operative Findings: 30cm of small intestine (ileum) eviscerated  through vaginal introitus, engorged but pink. Bowel with palpable pulses in mesentery and viable appearing serosa at completion of procedure. Gaping vaginal cuff, no evidence of laceration (the cuff was smooth, non-bleeding edges, did not appear to be a traumatic dehiscence. No gross intraperitoneal disease palpable.    08/05/2020 Imaging   1. There is a new, nonspecific soft tissue nodule within the left pelvic sidewall measuring 1.1 cm. Cannot rule out recurrent tumor. 2. Interval improvement in right-sided hydronephrosis with removal of right double J nephroureteral stent. 3. Emphysema and aortic atherosclerosis.   Aortic Atherosclerosis (ICD10-I70.0) and Emphysema (ICD10-J43.9)   10/25/2020 Imaging   1. Prominence of the left ovary/adnexa versus adjacent adenopathy along the left pelvic sidewall, substantially increased from prior exam with soft tissue density in this region measuring 4.5 by 3.5 cm (formerly 1.7 by 1.3 cm on 08/05/2020). Pelvic sonography recommended for further characterization. 2. New mild nonspecific ascites. 3. Other imaging findings of potential clinical significance: Aortic Atherosclerosis (ICD10-I70.0) and Emphysema (ICD10-J43.9). Scattered stable reticulonodular peripheral interstitial accentuation in the lungs. Lower lumbar impingement.   11/15/2020 -  Chemotherapy    Patient is on Treatment Plan: UTERINE LENVATINIB + PEMBROLIZUMAB Q21D         REVIEW OF SYSTEMS:   Constitutional: Denies fevers, chills  Eyes: Denies blurriness of vision Ears, nose, mouth, throat, and face: Denies mucositis or sore throat Respiratory: Denies cough, dyspnea or wheezes Cardiovascular: Denies palpitation, chest discomfort or lower extremity swelling Skin: Denies abnormal skin rashes Lymphatics: Denies new lymphadenopathy or easy bruising Behavioral/Psych: Mood is stable, no new changes  All other systems were reviewed with the patient and are negative.  I have reviewed the  past medical history, past surgical history, social history and family history with the patient and they  are unchanged from previous note.  ALLERGIES:  is allergic to gadolinium and other.  MEDICATIONS:  Current Outpatient Medications  Medication Sig Dispense Refill   acetaminophen (TYLENOL) 500 MG tablet Take 500 mg by mouth every 6 (six) hours as needed for moderate pain.      Calcium Carb-Cholecalciferol (CALCIUM 600+D3) 600-200 MG-UNIT TABS Take 1 tablet by mouth daily.      Capsaicin (ARTHRITIS PAIN RELIEF EX) Apply 1 application topically daily as needed (Arthritis pain).     fluticasone (FLONASE) 50 MCG/ACT nasal spray Place 1 spray into both nostrils daily as needed for allergies or rhinitis.      hydrocortisone cream 0.5 % Apply 1 application topically daily as needed for itching.     lenvatinib 10 mg daily dose (LENVIMA, 10 MG DAILY DOSE,) capsule Take 1 capsule (10 mg total) by mouth daily. 30 capsule 11   levothyroxine (SYNTHROID) 100 MCG tablet Take 1 tablet (100 mcg total) by mouth every morning. 30 tablet 3   loratadine (CLARITIN) 10 MG tablet Take 10 mg by mouth daily.     naphazoline-pheniramine (ALLERGY EYE) 0.025-0.3 % ophthalmic solution Place 1 drop into both eyes daily as needed for allergies.     ondansetron (ZOFRAN) 8 MG tablet Take 1 tablet (8 mg total) by mouth 2 (two) times daily as needed (Nausea or vomiting). 30 tablet 1   oxyCODONE (OXY IR/ROXICODONE) 5 MG immediate release tablet Take 1 tablet (5 mg total) by mouth every 6 (six) hours as needed for severe pain. 30 tablet 0   potassium chloride SA (KLOR-CON) 20 MEQ tablet Take 1 tablet (20 mEq total) by mouth daily. 30 tablet 0   prochlorperazine (COMPAZINE) 10 MG tablet Take 1 tablet (10 mg total) by mouth every 6 (six) hours as needed (Nausea or vomiting). 30 tablet 1   rivaroxaban (XARELTO) 20 MG TABS tablet Take 1 tablet (20 mg total) by mouth daily with supper. 30 tablet 3   No current facility-administered  medications for this visit.   Facility-Administered Medications Ordered in Other Visits  Medication Dose Route Frequency Provider Last Rate Last Admin   0.9 %  sodium chloride infusion   Intravenous Once Alvy Bimler, Britani Beattie, MD       pembrolizumab (KEYTRUDA) 200 mg in sodium chloride 0.9 % 50 mL chemo infusion  200 mg Intravenous Once Alvy Bimler, Sherron Mummert, MD        PHYSICAL EXAMINATION: ECOG PERFORMANCE STATUS: 1 - Symptomatic but completely ambulatory  Vitals:   12/06/20 1158  BP: (!) 148/74  Pulse: (!) 107  Resp: 18  Temp: (!) 97.4 F (36.3 C)  SpO2: 100%   Filed Weights   12/06/20 1158  Weight: 115 lb (52.2 kg)    GENERAL:alert, no distress and comfortable SKIN: skin color, texture, turgor are normal, no rashes or significant lesions EYES: normal, Conjunctiva are pink and non-injected, sclera clear OROPHARYNX:no exudate, no erythema and lips, buccal mucosa, and tongue normal  NECK: supple, thyroid normal size, non-tender, without nodularity LYMPH:  no palpable lymphadenopathy in the cervical, axillary or inguinal LUNGS: clear to auscultation and percussion with normal breathing effort HEART: regular rate & rhythm and no murmurs with moderate bilateral lower extremity edema, left greater than right ABDOMEN:abdomen soft, non-tender and normal bowel sounds Musculoskeletal:no cyanosis of digits and no clubbing  NEURO: alert & oriented x 3 with fluent speech, no focal motor/sensory deficits  LABORATORY DATA:  I have reviewed the data as listed    Component Value Date/Time   NA  147 (H) 12/06/2020 1110   NA 142 12/08/2014 1153   K 2.9 (L) 12/06/2020 1110   K 3.7 12/08/2014 1153   CL 104 12/06/2020 1110   CO2 32 12/06/2020 1110   CO2 29 12/08/2014 1153   GLUCOSE 99 12/06/2020 1110   GLUCOSE 84 12/08/2014 1153   BUN 10 12/06/2020 1110   BUN 8.6 12/08/2014 1153   CREATININE 0.68 12/06/2020 1110   CREATININE 0.7 12/08/2014 1153   CALCIUM 9.1 12/06/2020 1110   CALCIUM 9.3 12/08/2014  1153   PROT 7.6 12/06/2020 1110   PROT 7.2 12/08/2014 1153   ALBUMIN 2.8 (L) 12/06/2020 1110   ALBUMIN 3.8 12/08/2014 1153   AST 14 (L) 12/06/2020 1110   AST 21 12/08/2014 1153   ALT <6 12/06/2020 1110   ALT 9 12/08/2014 1153   ALKPHOS 54 12/06/2020 1110   ALKPHOS 63 12/08/2014 1153   BILITOT 0.3 12/06/2020 1110   BILITOT 0.56 12/08/2014 1153   GFRNONAA >60 12/06/2020 1110   GFRAA >60 02/10/2020 0855    No results found for: SPEP, UPEP  Lab Results  Component Value Date   WBC 5.3 12/06/2020   NEUTROABS 4.2 12/06/2020   HGB 9.1 (L) 12/06/2020   HCT 27.6 (L) 12/06/2020   MCV 104.5 (H) 12/06/2020   PLT 324 12/06/2020      Chemistry      Component Value Date/Time   NA 147 (H) 12/06/2020 1110   NA 142 12/08/2014 1153   K 2.9 (L) 12/06/2020 1110   K 3.7 12/08/2014 1153   CL 104 12/06/2020 1110   CO2 32 12/06/2020 1110   CO2 29 12/08/2014 1153   BUN 10 12/06/2020 1110   BUN 8.6 12/08/2014 1153   CREATININE 0.68 12/06/2020 1110   CREATININE 0.7 12/08/2014 1153      Component Value Date/Time   CALCIUM 9.1 12/06/2020 1110   CALCIUM 9.3 12/08/2014 1153   ALKPHOS 54 12/06/2020 1110   ALKPHOS 63 12/08/2014 1153   AST 14 (L) 12/06/2020 1110   AST 21 12/08/2014 1153   ALT <6 12/06/2020 1110   ALT 9 12/08/2014 1153   BILITOT 0.3 12/06/2020 1110   BILITOT 0.56 12/08/2014 1153

## 2020-12-06 NOTE — Patient Instructions (Signed)
[facility]  Discharge Instructions: Thank you for choosing [facility] to provide your oncology and hematology care.   If you have a lab appointment with the Cancer Center, please go directly to the Cancer Center and check in at the registration area.   Wear comfortable clothing and clothing appropriate for easy access to any Portacath or PICC line.   We strive to give you quality time with your provider. You may need to reschedule your appointment if you arrive late (15 or more minutes).  Arriving late affects you and other patients whose appointments are after yours.  Also, if you miss three or more appointments without notifying the office, you may be dismissed from the clinic at the provider's discretion.      For prescription refill requests, have your pharmacy contact our office and allow 72 hours for refills to be completed.    Today you received the following chemotherapy and/or immunotherapy agents Keytruda      To help prevent nausea and vomiting after your treatment, we encourage you to take your nausea medication as directed.  BELOW ARE SYMPTOMS THAT SHOULD BE REPORTED IMMEDIATELY: *FEVER GREATER THAN 100.4 F (38 C) OR HIGHER *CHILLS OR SWEATING *NAUSEA AND VOMITING THAT IS NOT CONTROLLED WITH YOUR NAUSEA MEDICATION *UNUSUAL SHORTNESS OF BREATH *UNUSUAL BRUISING OR BLEEDING *URINARY PROBLEMS (pain or burning when urinating, or frequent urination) *BOWEL PROBLEMS (unusual diarrhea, constipation, pain near the anus) TENDERNESS IN MOUTH AND THROAT WITH OR WITHOUT PRESENCE OF ULCERS (sore throat, sores in mouth, or a toothache) UNUSUAL RASH, SWELLING OR PAIN  UNUSUAL VAGINAL DISCHARGE OR ITCHING   Items with * indicate a potential emergency and should be followed up as soon as possible or go to the Emergency Department if any problems should occur.  Please show the CHEMOTHERAPY ALERT CARD or IMMUNOTHERAPY ALERT CARD at check-in to  the Emergency Department and triage nurse.  Should you have questions after your visit or need to cancel or reschedule your appointment, please contact [facility]  Dept: [phone]  and follow the prompts.  Office hours are 8:00 a.m. to 4:30 p.m. Monday - Friday. Please note that voicemails left after 4:00 p.m. may not be returned until the following business day.  We are closed weekends and major holidays. You have access to a nurse at all times for urgent questions. Please call the main number to the clinic Dept: [phone] and follow the prompts.   For any non-urgent questions, you may also contact your provider using MyChart. We now offer e-Visits for anyone 18 and older to request care online for non-urgent symptoms. For details visit [email or web address].   Also download the MyChart app! Go to the app store, search "MyChart", open the app, select [facility], and log in with your MyChart username and password.  Due to Covid, a mask is required upon entering the hospital/clinic. If you do not have a mask, one will be given to you upon arrival. For doctor visits, patients may have 1 support person aged 18 or older with them. For treatment visits, patients cannot have anyone with them due to current Covid guidelines and our immunocompromised population.   Pembrolizumab injection What is this medication? PEMBROLIZUMAB (pem broe liz ue mab) is a monoclonal antibody. It is used totreat certain types of cancer. This medicine may be used for other purposes; ask your health care provider orpharmacist if you have questions. COMMON BRAND NAME(S): Keytruda What should I   tell my care team before I take this medication? They need to know if you have any of these conditions: autoimmune diseases like Crohn's disease, ulcerative colitis, or lupus have had or planning to have an allogeneic stem cell transplant (uses someone else's stem cells) history of organ  transplant history of chest radiation nervous system problems like myasthenia gravis or Guillain-Barre syndrome an unusual or allergic reaction to pembrolizumab, other medicines, foods, dyes, or preservatives pregnant or trying to get pregnant breast-feeding How should I use this medication? This medicine is for infusion into a vein. It is given by a health careprofessional in a hospital or clinic setting. A special MedGuide will be given to you before each treatment. Be sure to readthis information carefully each time. Talk to your pediatrician regarding the use of this medicine in children. While this drug may be prescribed for children as young as 6 months for selectedconditions, precautions do apply. Overdosage: If you think you have taken too much of this medicine contact apoison control center or emergency room at once. NOTE: This medicine is only for you. Do not share this medicine with others. What if I miss a dose? It is important not to miss your dose. Call your doctor or health careprofessional if you are unable to keep an appointment. What may interact with this medication? Interactions have not been studied. This list may not describe all possible interactions. Give your health care provider a list of all the medicines, herbs, non-prescription drugs, or dietary supplements you use. Also tell them if you smoke, drink alcohol, or use illegaldrugs. Some items may interact with your medicine. What should I watch for while using this medication? Your condition will be monitored carefully while you are receiving thismedicine. You may need blood work done while you are taking this medicine. Do not become pregnant while taking this medicine or for 4 months after stopping it. Women should inform their doctor if they wish to become pregnant or think they might be pregnant. There is a potential for serious side effects to an unborn child. Talk to your health care professional or pharmacist for  more information. Do not breast-feed an infant while taking this medicine orfor 4 months after the last dose. What side effects may I notice from receiving this medication? Side effects that you should report to your doctor or health care professionalas soon as possible: allergic reactions like skin rash, itching or hives, swelling of the face, lips, or tongue bloody or black, tarry breathing problems changes in vision chest pain chills confusion constipation cough diarrhea dizziness or feeling faint or lightheaded fast or irregular heartbeat fever flushing joint pain low blood counts - this medicine may decrease the number of white blood cells, red blood cells and platelets. You may be at increased risk for infections and bleeding. muscle pain muscle weakness pain, tingling, numbness in the hands or feet persistent headache redness, blistering, peeling or loosening of the skin, including inside the mouth signs and symptoms of high blood sugar such as dizziness; dry mouth; dry skin; fruity breath; nausea; stomach pain; increased hunger or thirst; increased urination signs and symptoms of kidney injury like trouble passing urine or change in the amount of urine signs and symptoms of liver injury like dark urine, light-colored stools, loss of appetite, nausea, right upper belly pain, yellowing of the eyes or skin sweating swollen lymph nodes weight loss Side effects that usually do not require medical attention (report to yourdoctor or health care professional if they continue   or are bothersome): decreased appetite hair loss tiredness This list may not describe all possible side effects. Call your doctor for medical advice about side effects. You may report side effects to FDA at1-800-FDA-1088. Where should I keep my medication? This drug is given in a hospital or clinic and will not be stored at home. NOTE: This sheet is a summary. It may not cover all possible information. If you  have questions about this medicine, talk to your doctor, pharmacist, orhealth care provider.  2022 Elsevier/Gold Standard (2019-04-09 21:44:53)   

## 2020-12-06 NOTE — Assessment & Plan Note (Signed)
She has bilateral lower extremity edema, left greater than right Her serum albumin is low I suspect this is due to third spacing She will continue her anticoagulation therapy

## 2020-12-06 NOTE — Assessment & Plan Note (Signed)
Left hip pain is due to local cancer recurrence Currently, oxycodone is helping However, she is not taking her pain medicine as directed I encouraged her to increase intake of oxycodone as needed We discussed narcotic refill policy

## 2020-12-07 ENCOUNTER — Ambulatory Visit
Admission: RE | Admit: 2020-12-07 | Discharge: 2020-12-07 | Disposition: A | Discharge status: Home / Self Care | Payer: Medicare Other | Source: Ambulatory Visit | Attending: Family Medicine | Admitting: Family Medicine

## 2020-12-07 ENCOUNTER — Encounter: Payer: Self-pay | Admitting: Hematology and Oncology

## 2020-12-07 ENCOUNTER — Telehealth: Payer: Self-pay

## 2020-12-07 ENCOUNTER — Other Ambulatory Visit: Payer: Self-pay | Admitting: Family Medicine

## 2020-12-07 DIAGNOSIS — R928 Other abnormal and inconclusive findings on diagnostic imaging of breast: Secondary | ICD-10-CM

## 2020-12-07 DIAGNOSIS — R922 Inconclusive mammogram: Secondary | ICD-10-CM | POA: Diagnosis not present

## 2020-12-07 DIAGNOSIS — N6310 Unspecified lump in the right breast, unspecified quadrant: Secondary | ICD-10-CM

## 2020-12-07 NOTE — Telephone Encounter (Signed)
-----   Message from [name], MD sent at 12/07/2020  8:55 AM EDT ----- Pls call and check on her and ask how she is doing Is she eating? Any diarrhea? Did she take her pain medicine and imodium?

## 2020-12-07 NOTE — Telephone Encounter (Signed)
Called daughter and given below message. Daughter verbalized understanding. She is eating with no problems. No diarrhea. Denies pain. She more alert after this treatment. Tammy will ask Delsy daily and prn if she is having diarrhea and pain.

## 2020-12-12 ENCOUNTER — Other Ambulatory Visit: Payer: Self-pay | Admitting: Hematology and Oncology

## 2020-12-13 ENCOUNTER — Other Ambulatory Visit: Payer: Self-pay | Admitting: Hematology and Oncology

## 2020-12-20 ENCOUNTER — Telehealth: Payer: Self-pay

## 2020-12-20 ENCOUNTER — Other Ambulatory Visit: Payer: Self-pay | Admitting: Hematology and Oncology

## 2020-12-20 DIAGNOSIS — M25559 Pain in unspecified hip: Secondary | ICD-10-CM

## 2020-12-20 DIAGNOSIS — R1900 Intra-abdominal and pelvic swelling, mass and lump, unspecified site: Secondary | ICD-10-CM

## 2020-12-20 DIAGNOSIS — C541 Malignant neoplasm of endometrium: Secondary | ICD-10-CM

## 2020-12-20 MED ORDER — OXYCODONE HCL 5 MG PO TABS: 5.0000 mg | ORAL_TABLET | Freq: Four times a day (QID) | ORAL | 0 refills | Status: AC | PRN

## 2020-12-20 NOTE — Telephone Encounter (Signed)
Returned daughters call.  She is requesting Oxycodone Refill and ask that it be sent to [facility] in [place], [name]. She is requesting to speak with social worker with options for her mother and talk about palliative care. Message sent to social work to call daughter. Iceis is having 2-3 episodes of diarrhea per day. She is taking x2 imodium with with first episode only. Instructed to take up to 8 imodium in a 24 hour period. She verbalized understanding.

## 2020-12-20 NOTE — Telephone Encounter (Signed)
I will send prescription I am worried about her Can she come in tomorrow to be evaluated? She might need IVF or supportive care

## 2020-12-20 NOTE — Telephone Encounter (Signed)
Called and given below message. Daughter verbalized understanding. She will call the office back after looking at her schedule.

## 2020-12-20 NOTE — Telephone Encounter (Signed)
Called Erica [name] back. She does not want to come in tomorrow and wants to wait until scheduled appt.. She is having no problems drinking and eating. She will increase fluid intake. She had x 1 episode after diarrhea of blood on tissue on Saturday. No further episodes. Daughter will call the office back if needed.

## 2020-12-27 ENCOUNTER — Encounter: Payer: Self-pay | Admitting: Hematology and Oncology

## 2020-12-27 ENCOUNTER — Inpatient Hospital Stay: Payer: Medicare Other

## 2020-12-27 ENCOUNTER — Encounter: Payer: Self-pay | Admitting: Oncology

## 2020-12-27 ENCOUNTER — Other Ambulatory Visit: Payer: Self-pay | Admitting: Hematology and Oncology

## 2020-12-27 ENCOUNTER — Inpatient Hospital Stay (HOSPITAL_BASED_OUTPATIENT_CLINIC_OR_DEPARTMENT_OTHER): Payer: Medicare Other | Admitting: Hematology and Oncology

## 2020-12-27 ENCOUNTER — Telehealth: Payer: Self-pay

## 2020-12-27 ENCOUNTER — Inpatient Hospital Stay: Payer: Medicare Other | Attending: Hematology and Oncology

## 2020-12-27 ENCOUNTER — Other Ambulatory Visit: Payer: Self-pay

## 2020-12-27 VITALS — BP 169/89 | HR 90

## 2020-12-27 VITALS — BP 174/88 | HR 104 | Temp 98.1°F | Resp 18 | Ht 62.0 in | Wt 120.6 lb

## 2020-12-27 DIAGNOSIS — I10 Essential (primary) hypertension: Secondary | ICD-10-CM

## 2020-12-27 DIAGNOSIS — E039 Hypothyroidism, unspecified: Secondary | ICD-10-CM

## 2020-12-27 DIAGNOSIS — M7989 Other specified soft tissue disorders: Secondary | ICD-10-CM

## 2020-12-27 DIAGNOSIS — D539 Nutritional anemia, unspecified: Secondary | ICD-10-CM

## 2020-12-27 DIAGNOSIS — C541 Malignant neoplasm of endometrium: Secondary | ICD-10-CM

## 2020-12-27 DIAGNOSIS — E876 Hypokalemia: Secondary | ICD-10-CM

## 2020-12-27 DIAGNOSIS — Z79899 Other long term (current) drug therapy: Secondary | ICD-10-CM | POA: Insufficient documentation

## 2020-12-27 DIAGNOSIS — Z7189 Other specified counseling: Secondary | ICD-10-CM

## 2020-12-27 DIAGNOSIS — J439 Emphysema, unspecified: Secondary | ICD-10-CM | POA: Insufficient documentation

## 2020-12-27 DIAGNOSIS — Z7901 Long term (current) use of anticoagulants: Secondary | ICD-10-CM | POA: Insufficient documentation

## 2020-12-27 DIAGNOSIS — R197 Diarrhea, unspecified: Secondary | ICD-10-CM | POA: Insufficient documentation

## 2020-12-27 DIAGNOSIS — C50211 Malignant neoplasm of upper-inner quadrant of right female breast: Secondary | ICD-10-CM | POA: Diagnosis not present

## 2020-12-27 DIAGNOSIS — Z171 Estrogen receptor negative status [ER-]: Secondary | ICD-10-CM | POA: Diagnosis not present

## 2020-12-27 DIAGNOSIS — I700 Atherosclerosis of aorta: Secondary | ICD-10-CM | POA: Diagnosis not present

## 2020-12-27 DIAGNOSIS — D509 Iron deficiency anemia, unspecified: Secondary | ICD-10-CM | POA: Insufficient documentation

## 2020-12-27 LAB — CMP (CANCER CENTER ONLY)
ALT: 8 U/L (ref 0–44)
AST: 17 U/L (ref 15–41)
Albumin: 3.3 g/dL — ABNORMAL LOW (ref 3.5–5.0)
Alkaline Phosphatase: 54 U/L (ref 38–126)
Anion gap: 11 (ref 5–15)
BUN: 13 mg/dL (ref 8–23)
CO2: 29 mmol/L (ref 22–32)
Calcium: 9.6 mg/dL (ref 8.9–10.3)
Chloride: 105 mmol/L (ref 98–111)
Creatinine: 0.88 mg/dL (ref 0.44–1.00)
GFR, Estimated: >60 mL/min (ref >60)
Glucose, Bld: 126 mg/dL — ABNORMAL HIGH (ref 70–99)
Potassium: 3.2 mmol/L — ABNORMAL LOW (ref 3.5–5.1)
Sodium: 145 mmol/L (ref 135–145)
Total Bilirubin: 0.4 mg/dL (ref 0.3–1.2)
Total Protein: 7.7 g/dL (ref 6.5–8.1)

## 2020-12-27 LAB — CBC WITH DIFFERENTIAL (CANCER CENTER ONLY)
Abs Immature Granulocytes: 0.02 10*3/uL (ref 0.00–0.07)
Basophils Absolute: 0 10*3/uL (ref 0.0–0.1)
Basophils Relative: 0 %
Eosinophils Absolute: 0 10*3/uL (ref 0.0–0.5)
Eosinophils Relative: 0 %
HCT: 26.1 % — ABNORMAL LOW (ref 36.0–46.0)
Hemoglobin: 8.6 g/dL — ABNORMAL LOW (ref 12.0–15.0)
Immature Granulocytes: 0 %
Lymphocytes Relative: 21 %
Lymphs Abs: 1 10*3/uL (ref 0.7–4.0)
MCH: 34.8 pg — ABNORMAL HIGH (ref 26.0–34.0)
MCHC: 33 g/dL (ref 30.0–36.0)
MCV: 105.7 fL — ABNORMAL HIGH (ref 80.0–100.0)
Monocytes Absolute: 0.3 10*3/uL (ref 0.1–1.0)
Monocytes Relative: 7 %
Neutro Abs: 3.3 10*3/uL (ref 1.7–7.7)
Neutrophils Relative %: 72 %
Platelet Count: 251 10*3/uL (ref 150–400)
RBC: 2.47 MIL/uL — ABNORMAL LOW (ref 3.87–5.11)
RDW: 13 % (ref 11.5–15.5)
WBC Count: 4.7 10*3/uL (ref 4.0–10.5)
nRBC: 0 % (ref 0.0–0.2)

## 2020-12-27 LAB — TSH: TSH: 10.392 u[IU]/mL — ABNORMAL HIGH (ref 0.308–3.960)

## 2020-12-27 LAB — TOTAL PROTEIN, URINE DIPSTICK: Protein, ur: NEGATIVE mg/dL

## 2020-12-27 MED ORDER — SODIUM CHLORIDE 0.9 % IV SOLN
200.0000 mg | Freq: Once | INTRAVENOUS | Status: AC
  Administered 2020-12-27: 200 mg via INTRAVENOUS
  Filled 2020-12-27: qty 8

## 2020-12-27 MED ORDER — ATENOLOL 25 MG PO TABS: 25.0000 mg | ORAL_TABLET | Freq: Every day | ORAL | 3 refills | Status: AC

## 2020-12-27 MED ORDER — SODIUM CHLORIDE 0.9 % IV SOLN
Freq: Once | INTRAVENOUS | Status: AC
  Filled 2020-12-27: qty 250

## 2020-12-27 MED ORDER — LEVOTHYROXINE SODIUM 150 MCG PO TABS: 150.0000 ug | ORAL_TABLET | Freq: Every day | ORAL | 3 refills | Status: AC

## 2020-12-27 NOTE — Patient Instructions (Signed)
[name]  Discharge Instructions: Thank you for choosing [facility] to provide your oncology and hematology care.   If you have a lab appointment with the Cancer Center, please go directly to the Cancer Center and check in at the registration area.   Wear comfortable clothing and clothing appropriate for easy access to any Portacath or PICC line.   We strive to give you quality time with your provider. You may need to reschedule your appointment if you arrive late (15 or more minutes).  Arriving late affects you and other patients whose appointments are after yours.  Also, if you miss three or more appointments without notifying the office, you may be dismissed from the clinic at the provider's discretion.      For prescription refill requests, have your pharmacy contact our office and allow 72 hours for refills to be completed.    Today you received the following chemotherapy and/or immunotherapy agents Keytruda      To help prevent nausea and vomiting after your treatment, we encourage you to take your nausea medication as directed.  BELOW ARE SYMPTOMS THAT SHOULD BE REPORTED IMMEDIATELY: *FEVER GREATER THAN 100.4 F (38 C) OR HIGHER *CHILLS OR SWEATING *NAUSEA AND VOMITING THAT IS NOT CONTROLLED WITH YOUR NAUSEA MEDICATION *UNUSUAL SHORTNESS OF BREATH *UNUSUAL BRUISING OR BLEEDING *URINARY PROBLEMS (pain or burning when urinating, or frequent urination) *BOWEL PROBLEMS (unusual diarrhea, constipation, pain near the anus) TENDERNESS IN MOUTH AND THROAT WITH OR WITHOUT PRESENCE OF ULCERS (sore throat, sores in mouth, or a toothache) UNUSUAL RASH, SWELLING OR PAIN  UNUSUAL VAGINAL DISCHARGE OR ITCHING   Items with * indicate a potential emergency and should be followed up as soon as possible or go to the Emergency Department if any problems should occur.  Please show the CHEMOTHERAPY ALERT CARD or IMMUNOTHERAPY ALERT CARD at check-in to  the Emergency Department and triage nurse.  Should you have questions after your visit or need to cancel or reschedule your appointment, please contact [facility]  Dept: [phone]  and follow the prompts.  Office hours are 8:00 a.m. to 4:30 p.m. Monday - Friday. Please note that voicemails left after 4:00 p.m. may not be returned until the following business day.  We are closed weekends and major holidays. You have access to a nurse at all times for urgent questions. Please call the main number to the clinic Dept: [phone] and follow the prompts.   For any non-urgent questions, you may also contact your provider using MyChart. We now offer e-Visits for anyone 18 and older to request care online for non-urgent symptoms. For details visit [email or web address].   Also download the MyChart app! Go to the app store, search "MyChart", open the app, select , and log in with your MyChart username and password.  Due to Covid, a mask is required upon entering the hospital/clinic. If you do not have a mask, one will be given to you upon arrival. For doctor visits, patients may have 1 support person aged 18 or older with them. For treatment visits, patients cannot have anyone with them due to current Covid guidelines and our immunocompromised population.   Pembrolizumab injection What is this medication? PEMBROLIZUMAB (pem broe liz ue mab) is a monoclonal antibody. It is used totreat certain types of cancer. This medicine may be used for other purposes; ask your health care provider orpharmacist if you have questions. COMMON BRAND NAME(S): Keytruda What should I   tell my care team before I take this medication? They need to know if you have any of these conditions: autoimmune diseases like Crohn's disease, ulcerative colitis, or lupus have had or planning to have an allogeneic stem cell transplant (uses someone else's stem cells) history of organ  transplant history of chest radiation nervous system problems like myasthenia gravis or Guillain-Barre syndrome an unusual or allergic reaction to pembrolizumab, other medicines, foods, dyes, or preservatives pregnant or trying to get pregnant breast-feeding How should I use this medication? This medicine is for infusion into a vein. It is given by a health careprofessional in a hospital or clinic setting. A special MedGuide will be given to you before each treatment. Be sure to readthis information carefully each time. Talk to your pediatrician regarding the use of this medicine in children. While this drug may be prescribed for children as young as 6 months for selectedconditions, precautions do apply. Overdosage: If you think you have taken too much of this medicine contact apoison control center or emergency room at once. NOTE: This medicine is only for you. Do not share this medicine with others. What if I miss a dose? It is important not to miss your dose. Call your doctor or health careprofessional if you are unable to keep an appointment. What may interact with this medication? Interactions have not been studied. This list may not describe all possible interactions. Give your health care provider a list of all the medicines, herbs, non-prescription drugs, or dietary supplements you use. Also tell them if you smoke, drink alcohol, or use illegaldrugs. Some items may interact with your medicine. What should I watch for while using this medication? Your condition will be monitored carefully while you are receiving thismedicine. You may need blood work done while you are taking this medicine. Do not become pregnant while taking this medicine or for 4 months after stopping it. Women should inform their doctor if they wish to become pregnant or think they might be pregnant. There is a potential for serious side effects to an unborn child. Talk to your health care professional or pharmacist for  more information. Do not breast-feed an infant while taking this medicine orfor 4 months after the last dose. What side effects may I notice from receiving this medication? Side effects that you should report to your doctor or health care professionalas soon as possible: allergic reactions like skin rash, itching or hives, swelling of the face, lips, or tongue bloody or black, tarry breathing problems changes in vision chest pain chills confusion constipation cough diarrhea dizziness or feeling faint or lightheaded fast or irregular heartbeat fever flushing joint pain low blood counts - this medicine may decrease the number of white blood cells, red blood cells and platelets. You may be at increased risk for infections and bleeding. muscle pain muscle weakness pain, tingling, numbness in the hands or feet persistent headache redness, blistering, peeling or loosening of the skin, including inside the mouth signs and symptoms of high blood sugar such as dizziness; dry mouth; dry skin; fruity breath; nausea; stomach pain; increased hunger or thirst; increased urination signs and symptoms of kidney injury like trouble passing urine or change in the amount of urine signs and symptoms of liver injury like dark urine, light-colored stools, loss of appetite, nausea, right upper belly pain, yellowing of the eyes or skin sweating swollen lymph nodes weight loss Side effects that usually do not require medical attention (report to yourdoctor or health care professional if they continue   or are bothersome): decreased appetite hair loss tiredness This list may not describe all possible side effects. Call your doctor for medical advice about side effects. You may report side effects to FDA at1-800-FDA-1088. Where should I keep my medication? This drug is given in a hospital or clinic and will not be stored at home. NOTE: This sheet is a summary. It may not cover all possible information. If you  have questions about this medicine, talk to your doctor, pharmacist, orhealth care provider.  2022 Elsevier/Gold Standard (2019-04-09 21:44:53)   

## 2020-12-27 NOTE — Telephone Encounter (Signed)
-----   Message from [name], MD sent at 12/27/2020 12:32 PM EDT ----- Pls call daughter: synthroid dose needs to be increased I will send new prescription to her pharmacy in [place]

## 2020-12-27 NOTE — Telephone Encounter (Signed)
Called and given below message to daughter. She verbalized understanding. 

## 2020-12-27 NOTE — Progress Notes (Signed)
[facility] OFFICE PROGRESS NOTE  Patient Care Team: Griffin, Elaine, MD as PCP - General (Family Medicine)  ASSESSMENT & PLAN:  Endometrial cancer (HCC) She thrived and improved since she started living with her daughter in [place] I recommend CT imaging after this cycle of treatment for objective assessment of response to therapy Recommend consideration for her to relocate to [place] to be with her daughter for better support system Am encouraged to see she has gained some weight We will consult social worker to help  Deficiency anemia She has profound anemia but denies bleeding I will repeat B12, iron studies and others in her next visit I have ordered these test last year and they were in the normal range It is very unusual to see profound anemia with lenvatinib and pembrolizumab  Hypokalemia due to loss of potassium Hypokalemia has improved I recommend close monitoring for diarrhea and to take Imodium as needed  Essential hypertension Her blood pressure is profoundly elevated even at home I will start her on beta-blocker  Acquired hypothyroidism She has profound hypothyroidism I will adjust the dose of Synthroid further  Orders Placed This Encounter  Procedures   CT ABDOMEN PELVIS W CONTRAST    Standing Status:   Future    Standing Expiration Date:   12/27/2021    Order Specific Question:   If indicated for the ordered procedure, I authorize the administration of contrast media per Radiology protocol    Answer:   Yes    Order Specific Question:   Preferred imaging location?    Answer:   [facility]    Order Specific Question:   Radiology Contrast Protocol - do NOT remove file path    Answer:   [email or web address]   Vitamin B12    Standing Status:   Future    Standing Expiration Date:   12/27/2021   Iron and TIBC    Standing Status:   Future    Standing Expiration Date:   12/27/2021   Ferritin    Standing  Status:   Future    Standing Expiration Date:   12/27/2021    All questions were answered. The patient knows to call the clinic with any problems, questions or concerns. The total time spent in the appointment was 30 minutes encounter with patients including review of chart and various tests results, discussions about plan of care and coordination of care plan   Ni Gorsuch, MD 12/27/2020 4:17 PM  INTERVAL HISTORY: Please see below for problem oriented charting. She returns with her family for further follow-up She has gained some weight since last time she was seen She has intermittent diarrhea She has been taking Imodium Her documented blood pressure from home is elevated She denies recent dizziness  SUMMARY OF ONCOLOGIC HISTORY: Oncology History Overview Note  ER/PR negative, MSI stable Genetic testing reported out on January 07, 2020 through the Common hereditary cancer panel found no pathogenic mutations   Breast cancer, right breast (HCC)  03/13/2014 Initial Diagnosis   Breast cancer, right breast (HCC)   01/07/2020 Genetic Testing   Negative genetic testing.  RAD50 c.1556C>T VUS identified.  The Common Hereditary Gene Panel offered by [facility] includes sequencing and/or deletion duplication testing of the following 48 genes: APC, ATM, AXIN2, BARD1, BMPR1A, BRCA1, BRCA2, BRIP1, CDH1, CDK4, CDKN2A (p14ARF), CDKN2A (p16INK4a), CHEK2, CTNNA1, DICER1, EPCAM (Deletion/duplication testing only), GREM1 (promoter region deletion/duplication testing only), KIT, MEN1, MLH1, MSH2, MSH3, MSH6, MUTYH, NBN, NF1, NHTL1, PALB2, PDGFRA, PMS2, POLD1, POLE, PTEN,   RAD50, RAD51C, RAD51D, RNF43, SDHB, SDHC, SDHD, SMAD4, SMARCA4. STK11, TP53, TSC1, TSC2, and VHL.  The following genes were evaluated for sequence changes only: SDHA and HOXB13 c.251G>A variant only. The report date is January 07, 2020.   Endometrial cancer (HCC)  01/08/2019 Imaging   Hypermetabolic activity within central uterus, consistent with  known endometrial carcinoma.   No evidence of local or distant metastatic disease.     01/16/2019 Pathology Results   1. Lymph node, sentinel, biopsy, left obturator - NO CARCINOMA IDENTIFIED IN ONE LYMPH NODE (0/1) - SEE COMMENT 2. Lymph node, sentinel, biopsy, right obturator - NO CARCINOMA IDENTIFIED IN ONE LYMPH NODE (0/1) - SEE COMMENT 3. Uterus +/- tubes/ovaries, neoplastic, cervix, bilateral fallopian tubes and ovaries UTERUS: - ENDOMETRIOID CARCINOMA, FIGO GRADE 3, CONFINED TO THE UTERUS - SEE ONCOLOGY TABLE BELOW CERVIX: - NO CARCINOMA IDENTIFIED BILATERAL OVARIES: - NO CARCINOMA IDENTIFIED BILATERAL FALLOPIAN TUBES: - BENIGN PARATUBAL CYSTS (RIGHT) - NO CARCINOMA IDENTIFIED Microscopic Comment 1. and 2. Cytokeratin AE1/3 was performed on the sentinel lymph nodes to exclude micrometastasis. There is no evidence of metastatic carcinoma by immunohistochemistry. 3. UTERUS, CARCINOMA OR CARCINOSARCOMA Procedure: Hysterectomy and bilateral salpingo-oophorectomy Histologic type: Endometrioid carcinoma Histologic Grade: FIGO Grade 3 Myometrial invasion: Depth of invasion: 4 mm 1 ofMyometrial thickness: 27 mm Uterine Serosa Involvement: Not identified Cervical stromal involvement: Not identified Extent of involvement of other organs: N/A Lymphovascular invasion: Not identified Regional Lymph Nodes: Examined: 2 Sentinel 0 Non-sentinel 2 Total Lymph nodes with metastasis: N/A Isolated tumor cells (< 0.2 mm): 0 Micrometastasis: (> 0.2 mm and < 2.0 mm): 0 Macrometastasis: (> 2.0 mm): 0 Extracapsular extension: N/A Tumor block for ancillary studies: 3C MMR / MSI testing: Pending Pathologic Stage Classification (pTNM, AJCC 8th edition): pT1a, pN0 FIGO Stage: FIGO Ia   01/16/2019 Surgery   Surgeon: Rossi, Emma Caroline  Pre-operative Diagnosis: endometrial cancer grade 3    Operation: Robotic-assisted laparoscopic total hysterectomy with bilateral salpingoophorectomy,  SLN biopsy  Operative Findings:  : 9cm uterus (mildly broadened and bulky), normal appearing fallopian tubes and ovaries.       12/15/2019 Imaging   1. Status post hysterectomy. Soft tissue nodule in the right hemipelvis measuring 3.4 x 2.6 cm. Soft tissue nodule or left pelvic sidewall/iliac lymph node conglomerate measuring 2.8 x 2.1 cm. Findings are consistent with recurrent/metastatic disease. 2. Moderate volume ascites throughout the abdomen and pelvis with fine peritoneal nodularity noted in the pelvis and new omental or ventral peritoneal soft tissue thickening, consistent with peritoneal metastatic disease. 3. Moderate right hydronephrosis and hydroureter, the distal right ureter obstructed by right pelvic soft tissue mass noted above. 4. Aortic Atherosclerosis (ICD10-I70.0).   12/22/2019 Cancer Staging   Staging form: Corpus Uteri - Carcinoma and Carcinosarcoma, AJCC 8th Edition - Clinical stage from 12/22/2019: FIGO Stage IVB (rcT1a, cN0, cM1) - Signed by Gorsuch, Ni, MD on 12/22/2019    12/24/2019 Imaging   CT chest 1. No evidence of metastatic disease in the chest. 2. Stable 3 mm pulmonary nodule of the peripheral right lower lobe, almost certainly incidental and benign. Attention on follow-up.  3. Scarring of the left lung base. 4. Aortic Atherosclerosis (ICD10-I70.0).   12/29/2019 Procedure   Preoperative diagnosis:  Right ureteral obstruction Metastatic endometrial cancer   Postoperative diagnosis:  Right ureteral obstruction Metastatic endometrial cancer   Procedure:   Cystoscopy Right ureteral stent placement (6 x 24-no string)  Right retrograde pyelography with interpretation   Surgeon: Lester S. Borden, Jr. M.D.      Intraoperative findings: Right retrograde pyelography was performed using a 6 French ureteral catheter and Omnipaque contrast.  This demonstrated severe deviation of the distal ureter laterally around the patient's pelvic mass.  The distal ureter was clearly  compressed extrinsically without intrinsic filling defects.  The ureter was then significantly dilated above the pelvis with some tortuosity extending into a dilated renal collecting system.   12/31/2019 - 04/13/2020 Chemotherapy   The patient had carboplatin and taxol for chemotherapy treatment.     01/08/2020 Genetic Testing   Genetic testing reported out on January 07, 2020 through the Common hereditary cancer panel found no pathogenic mutations   03/01/2020 Imaging   1. Overall significant improvement, with reduced size of the pelvic adenopathy, reduced conspicuity of prior peritoneal tumor deposits, and resolution of the prior ascites. 2. Right double-J ureteral stent in place with resolution of the prior right hydronephrosis and right hydroureter. 3. Heterogeneous density in the left external iliac and common femoral vein could be from mixing of opacified and unopacified venous blood versus DVT. Consider left lower extremity Doppler venous ultrasound for further workup. 4. Other imaging findings of potential clinical significance: Mild cardiomegaly. Pancreas divisum. Lumbar spondylosis and degenerative disc disease causing multilevel impingement. Subglandular nodularity in the right breast for example on image 6 of series 2 was not hypermetabolic on prior PET-CT of 01/08/2019. 5. Emphysema and aortic atherosclerosis.     05/07/2020 Imaging   1. Small-bowel loops extend inferior to the pelvic floor along the expected course of the vagina. This is compatible with reported clinical history of vaginal cuff dehiscence. 2. Interval decrease in size of the left pelvic sidewall lymph node and soft tissue in the right pelvis. No new or progressive findings on today's exam. 3. Mild right hydronephrosis with double-J internal ureteral stent in situ. 4. Small volume intraperitoneal free fluid. 5. Aortic Atherosclerosis (ICD10-I70.0) and Emphysema (ICD10-J43.9).     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   Assistant Surgeon: none   Specimens: vaginal cuff    Operative Findings: 30cm of small intestine (ileum) eviscerated through vaginal introitus, engorged but pink. Bowel with palpable pulses in mesentery and viable appearing serosa at completion of procedure. Gaping vaginal cuff, no evidence of laceration (the cuff was smooth, non-bleeding edges, did not appear to be a traumatic dehiscence. No gross intraperitoneal disease palpable.    08/05/2020 Imaging   1. There is a new, nonspecific soft tissue nodule within the left pelvic sidewall measuring 1.1 cm. Cannot rule out recurrent tumor. 2. Interval improvement in right-sided hydronephrosis with removal of right double J nephroureteral stent. 3. Emphysema and aortic atherosclerosis.   Aortic Atherosclerosis (ICD10-I70.0) and Emphysema (ICD10-J43.9)   10/25/2020 Imaging   1. Prominence of the left ovary/adnexa versus adjacent adenopathy along the left pelvic sidewall, substantially increased from prior exam with soft tissue density in this region measuring 4.5 by 3.5 cm (formerly 1.7 by 1.3 cm on 08/05/2020). Pelvic sonography recommended for further characterization. 2. New mild nonspecific ascites. 3. Other imaging findings of potential clinical significance: Aortic Atherosclerosis (ICD10-I70.0) and Emphysema (ICD10-J43.9). Scattered stable reticulonodular peripheral interstitial accentuation in the lungs. Lower lumbar impingement.   11/15/2020 -  Chemotherapy    Patient is on Treatment Plan: UTERINE LENVATINIB + PEMBROLIZUMAB Q21D         REVIEW OF SYSTEMS:   Constitutional: Denies fevers, chills or abnormal weight loss Eyes: Denies blurriness of   vision Ears, nose, mouth, throat, and face: Denies mucositis or sore throat Respiratory: Denies cough, dyspnea or wheezes Cardiovascular: Denies  palpitation, chest discomfort or lower extremity swelling Skin: Denies abnormal skin rashes Lymphatics: Denies new lymphadenopathy or easy bruising Neurological:Denies numbness, tingling or new weaknesses Behavioral/Psych: Mood is stable, no new changes  All other systems were reviewed with the patient and are negative.  I have reviewed the past medical history, past surgical history, social history and family history with the patient and they are unchanged from previous note.  ALLERGIES:  is allergic to gadolinium and other.  MEDICATIONS:  Current Outpatient Medications  Medication Sig Dispense Refill   atenolol (TENORMIN) 25 MG tablet Take 1 tablet (25 mg total) by mouth daily. 30 tablet 3   acetaminophen (TYLENOL) 500 MG tablet Take 500 mg by mouth every 6 (six) hours as needed for moderate pain.      Calcium Carb-Cholecalciferol (CALCIUM 600+D3) 600-200 MG-UNIT TABS Take 1 tablet by mouth daily.      Capsaicin (ARTHRITIS PAIN RELIEF EX) Apply 1 application topically daily as needed (Arthritis pain).     fluticasone (FLONASE) 50 MCG/ACT nasal spray Place 1 spray into both nostrils daily as needed for allergies or rhinitis.      hydrocortisone cream 0.5 % Apply 1 application topically daily as needed for itching.     lenvatinib 10 mg daily dose (LENVIMA, 10 MG DAILY DOSE,) capsule Take 1 capsule (10 mg total) by mouth daily. 30 capsule 11   levothyroxine (SYNTHROID) 150 MCG tablet Take 1 tablet (150 mcg total) by mouth daily before breakfast. 30 tablet 3   loratadine (CLARITIN) 10 MG tablet Take 10 mg by mouth daily.     naphazoline-pheniramine (ALLERGY EYE) 0.025-0.3 % ophthalmic solution Place 1 drop into both eyes daily as needed for allergies.     ondansetron (ZOFRAN) 8 MG tablet Take 1 tablet (8 mg total) by mouth 2 (two) times daily as needed (Nausea or vomiting). 30 tablet 1   oxyCODONE (OXY IR/ROXICODONE) 5 MG immediate release tablet Take 1 tablet (5 mg total) by mouth every 6  (six) hours as needed for severe pain. 30 tablet 0   potassium chloride SA (KLOR-CON) 20 MEQ tablet TAKE 1 TABLET(20 MEQ) BY MOUTH DAILY 30 tablet 0   prochlorperazine (COMPAZINE) 10 MG tablet Take 1 tablet (10 mg total) by mouth every 6 (six) hours as needed (Nausea or vomiting). 30 tablet 1   rivaroxaban (XARELTO) 20 MG TABS tablet Take 1 tablet (20 mg total) by mouth daily with supper. 30 tablet 3   No current facility-administered medications for this visit.    PHYSICAL EXAMINATION: ECOG PERFORMANCE STATUS: 1 - Symptomatic but completely ambulatory  Vitals:   12/27/20 1129  BP: (!) 174/88  Pulse: (!) 104  Resp: 18  Temp: 98.1 F (36.7 C)  SpO2: 100%   Filed Weights   12/27/20 1129  Weight: 120 lb 9.6 oz (54.7 kg)    GENERAL:alert, no distress and comfortable SKIN: skin color, texture, turgor are normal, no rashes or significant lesions EYES: normal, Conjunctiva are pink and non-injected, sclera clear OROPHARYNX:no exudate, no erythema and lips, buccal mucosa, and tongue normal  NECK: supple, thyroid normal size, non-tender, without nodularity LYMPH:  no palpable lymphadenopathy in the cervical, axillary or inguinal LUNGS: clear to auscultation and percussion with normal breathing effort HEART: regular rate & rhythm and no murmurs and no lower extremity edema ABDOMEN:abdomen soft, non-tender and normal bowel sounds Musculoskeletal:no cyanosis of digits and   no clubbing  NEURO: alert & oriented x 3 with fluent speech, no focal motor/sensory deficits  LABORATORY DATA:  I have reviewed the data as listed    Component Value Date/Time   NA 145 12/27/2020 1108   NA 142 12/08/2014 1153   K 3.2 (L) 12/27/2020 1108   K 3.7 12/08/2014 1153   CL 105 12/27/2020 1108   CO2 29 12/27/2020 1108   CO2 29 12/08/2014 1153   GLUCOSE 126 (H) 12/27/2020 1108   GLUCOSE 84 12/08/2014 1153   BUN 13 12/27/2020 1108   BUN 8.6 12/08/2014 1153   CREATININE 0.88 12/27/2020 1108   CREATININE  0.7 12/08/2014 1153   CALCIUM 9.6 12/27/2020 1108   CALCIUM 9.3 12/08/2014 1153   PROT 7.7 12/27/2020 1108   PROT 7.2 12/08/2014 1153   ALBUMIN 3.3 (L) 12/27/2020 1108   ALBUMIN 3.8 12/08/2014 1153   AST 17 12/27/2020 1108   AST 21 12/08/2014 1153   ALT 8 12/27/2020 1108   ALT 9 12/08/2014 1153   ALKPHOS 54 12/27/2020 1108   ALKPHOS 63 12/08/2014 1153   BILITOT 0.4 12/27/2020 1108   BILITOT 0.56 12/08/2014 1153   GFRNONAA >60 12/27/2020 1108   GFRAA >60 02/10/2020 0855    No results found for: SPEP, UPEP  Lab Results  Component Value Date   WBC 4.7 12/27/2020   NEUTROABS 3.3 12/27/2020   HGB 8.6 (L) 12/27/2020   HCT 26.1 (L) 12/27/2020   MCV 105.7 (H) 12/27/2020   PLT 251 12/27/2020      Chemistry      Component Value Date/Time   NA 145 12/27/2020 1108   NA 142 12/08/2014 1153   K 3.2 (L) 12/27/2020 1108   K 3.7 12/08/2014 1153   CL 105 12/27/2020 1108   CO2 29 12/27/2020 1108   CO2 29 12/08/2014 1153   BUN 13 12/27/2020 1108   BUN 8.6 12/08/2014 1153   CREATININE 0.88 12/27/2020 1108   CREATININE 0.7 12/08/2014 1153      Component Value Date/Time   CALCIUM 9.6 12/27/2020 1108   CALCIUM 9.3 12/08/2014 1153   ALKPHOS 54 12/27/2020 1108   ALKPHOS 63 12/08/2014 1153   AST 17 12/27/2020 1108   AST 21 12/08/2014 1153   ALT 8 12/27/2020 1108   ALT 9 12/08/2014 1153   BILITOT 0.4 12/27/2020 1108   BILITOT 0.56 12/08/2014 1153       RADIOGRAPHIC STUDIES: I have personally reviewed the radiological images as listed and agreed with the findings in the report. US BREAST LTD UNI RIGHT INC AXILLA  Result Date: 12/07/2020 CLINICAL DATA:  70-year-old female for six-month follow-up of RIGHT breast mass. History of breast cancer and lumpectomy. EXAM: DIGITAL DIAGNOSTIC UNILATERAL RIGHT MAMMOGRAM WITH TOMOSYNTHESIS AND CAD; ULTRASOUND RIGHT BREAST LIMITED TECHNIQUE: Right digital diagnostic mammography and breast tomosynthesis was performed. The images were evaluated  with computer-aided detection.; Targeted ultrasound examination of the right breast was performed COMPARISON:  Previous exam(s). ACR Breast Density Category d: The breast tissue is extremely dense, which lowers the sensitivity of mammography. FINDINGS: Full field views of the RIGHT breast demonstrate a slightly less conspicuous UPPER INNER RIGHT breast mass. No new suspicious findings are noted. Targeted ultrasound is performed, showing a stable 1 x 0.3 x 1.3 cm circumscribed oval hypoechoic parallel mass at the 1 o'clock position of the RIGHT breast 9 cm from the nipple. IMPRESSION: 1. Stable UPPER INNER RIGHT breast mass. Six-month follow-up recommended to ensure 1 year stability. RECOMMENDATION: Bilateral diagnostic   mammogram and RIGHT breast ultrasound in 6 months. I have discussed the findings and recommendations with the patient. If applicable, a reminder letter will be sent to the patient regarding the next appointment. BI-RADS CATEGORY  3: Probably benign. Electronically Signed   By: Margarette Canada M.D.   On: 12/07/2020 10:26  MM DIAG BREAST TOMO UNI RIGHT  Result Date: 12/07/2020 CLINICAL DATA:  70 year old female for six-month follow-up of RIGHT breast mass. History of breast cancer and lumpectomy. EXAM: DIGITAL DIAGNOSTIC UNILATERAL RIGHT MAMMOGRAM WITH TOMOSYNTHESIS AND CAD; ULTRASOUND RIGHT BREAST LIMITED TECHNIQUE: Right digital diagnostic mammography and breast tomosynthesis was performed. The images were evaluated with computer-aided detection.; Targeted ultrasound examination of the right breast was performed COMPARISON:  Previous exam(s). ACR Breast Density Category d: The breast tissue is extremely dense, which lowers the sensitivity of mammography. FINDINGS: Full field views of the RIGHT breast demonstrate a slightly less conspicuous UPPER INNER RIGHT breast mass. No new suspicious findings are noted. Targeted ultrasound is performed, showing a stable 1 x 0.3 x 1.3 cm circumscribed oval  hypoechoic parallel mass at the 1 o'clock position of the RIGHT breast 9 cm from the nipple. IMPRESSION: 1. Stable UPPER INNER RIGHT breast mass. Six-month follow-up recommended to ensure 1 year stability. RECOMMENDATION: Bilateral diagnostic mammogram and RIGHT breast ultrasound in 6 months. I have discussed the findings and recommendations with the patient. If applicable, a reminder letter will be sent to the patient regarding the next appointment. BI-RADS CATEGORY  3: Probably benign. Electronically Signed   By: Margarette Canada M.D.   On: 12/07/2020 10:26

## 2020-12-27 NOTE — Assessment & Plan Note (Signed)
She thrived and improved since she started living with her daughter in [place] I recommend CT imaging after this cycle of treatment for objective assessment of response to therapy Recommend consideration for her to relocate to [place] to be with her daughter for better support system Am encouraged to see she has gained some weight We will consult social worker to help

## 2020-12-27 NOTE — Assessment & Plan Note (Signed)
Hypokalemia has improved I recommend close monitoring for diarrhea and to take Imodium as needed

## 2020-12-27 NOTE — Assessment & Plan Note (Signed)
Her blood pressure is profoundly elevated even at home I will start her on beta-blocker

## 2020-12-27 NOTE — Assessment & Plan Note (Signed)
She has profound hypothyroidism I will adjust the dose of Synthroid further

## 2020-12-27 NOTE — Assessment & Plan Note (Addendum)
She has profound anemia but denies bleeding I will repeat B12, iron studies and others in her next visit I have ordered these test last year and they were in the normal range It is very unusual to see profound anemia with lenvatinib and pembrolizumab

## 2020-12-28 ENCOUNTER — Encounter: Payer: Self-pay | Admitting: *Deleted

## 2020-12-28 NOTE — Progress Notes (Signed)
Erica [name]  Clinical Social [name] was referred by medical oncology for assessment of psychosocial needs- specifically questions regarding palliative care  Clinical Social Worker contacted caregiver by phone  to offer support and assess for needs.    Patient is currently living with her daughter, Erica [name], in [name], [name].  Patient's daughter, Erica [name], and CSW discussed palliative care, hospice, skilled nursing facilities, assisted living facility, and home care services.    CSW follow up: CSW emailed patient's daughter information on local Hospice & palliative care provider in [name].  CSW encouraged patient's daughter to contact provider to learn more about their services and how they can support the patient in their community.    Gwinda Maine, LCSW  Clinical Social Worker [facility]

## 2020-12-30 ENCOUNTER — Encounter: Payer: Self-pay | Admitting: Hematology and Oncology

## 2020-12-30 ENCOUNTER — Telehealth: Payer: Self-pay | Admitting: Oncology

## 2020-12-30 NOTE — Telephone Encounter (Signed)
Called in referral to [facility] at [facility].  Faxed records to [phone].  They will call [facility] with an appointment.

## 2021-01-03 ENCOUNTER — Telehealth: Payer: Self-pay

## 2021-01-03 NOTE — Telephone Encounter (Signed)
Returned Marcie's call from [facility]. Radiology is mailing a CD with x-ray's tomorrow. It should arrive on Wednesday. Marcie verbalized understanding.

## 2021-01-05 DIAGNOSIS — N6310 Unspecified lump in the right breast, unspecified quadrant: Secondary | ICD-10-CM | POA: Diagnosis not present

## 2021-01-05 DIAGNOSIS — R1909 Other intra-abdominal and pelvic swelling, mass and lump: Secondary | ICD-10-CM | POA: Diagnosis not present

## 2021-01-10 DIAGNOSIS — R634 Abnormal weight loss: Secondary | ICD-10-CM | POA: Diagnosis not present

## 2021-01-10 DIAGNOSIS — G893 Neoplasm related pain (acute) (chronic): Secondary | ICD-10-CM | POA: Diagnosis not present

## 2021-01-10 DIAGNOSIS — R911 Solitary pulmonary nodule: Secondary | ICD-10-CM | POA: Diagnosis not present

## 2021-01-14 ENCOUNTER — Other Ambulatory Visit: Payer: Medicare Other

## 2021-01-16 ENCOUNTER — Other Ambulatory Visit: Payer: Self-pay | Admitting: Hematology and Oncology

## 2021-01-17 ENCOUNTER — Ambulatory Visit: Payer: Medicare Other

## 2021-01-17 ENCOUNTER — Ambulatory Visit: Payer: Medicare Other | Admitting: Hematology and Oncology

## 2021-01-17 ENCOUNTER — Telehealth: Payer: Self-pay

## 2021-01-17 DIAGNOSIS — R634 Abnormal weight loss: Secondary | ICD-10-CM | POA: Diagnosis not present

## 2021-01-17 DIAGNOSIS — R188 Other ascites: Secondary | ICD-10-CM | POA: Diagnosis not present

## 2021-01-17 DIAGNOSIS — Z79899 Other long term (current) drug therapy: Secondary | ICD-10-CM | POA: Diagnosis not present

## 2021-01-17 DIAGNOSIS — Z853 Personal history of malignant neoplasm of breast: Secondary | ICD-10-CM | POA: Diagnosis not present

## 2021-01-17 DIAGNOSIS — Z5111 Encounter for antineoplastic chemotherapy: Secondary | ICD-10-CM | POA: Diagnosis not present

## 2021-01-17 DIAGNOSIS — Z171 Estrogen receptor negative status [ER-]: Secondary | ICD-10-CM | POA: Diagnosis not present

## 2021-01-17 DIAGNOSIS — Z7901 Long term (current) use of anticoagulants: Secondary | ICD-10-CM | POA: Diagnosis not present

## 2021-01-17 DIAGNOSIS — N1330 Unspecified hydronephrosis: Secondary | ICD-10-CM | POA: Diagnosis not present

## 2021-01-17 DIAGNOSIS — G893 Neoplasm related pain (acute) (chronic): Secondary | ICD-10-CM | POA: Diagnosis not present

## 2021-01-17 DIAGNOSIS — R197 Diarrhea, unspecified: Secondary | ICD-10-CM | POA: Diagnosis not present

## 2021-01-17 DIAGNOSIS — M25552 Pain in left hip: Secondary | ICD-10-CM | POA: Diagnosis not present

## 2021-01-17 DIAGNOSIS — E876 Hypokalemia: Secondary | ICD-10-CM | POA: Diagnosis not present

## 2021-01-17 DIAGNOSIS — D649 Anemia, unspecified: Secondary | ICD-10-CM | POA: Diagnosis not present

## 2021-01-17 DIAGNOSIS — R911 Solitary pulmonary nodule: Secondary | ICD-10-CM | POA: Diagnosis not present

## 2021-01-17 NOTE — Telephone Encounter (Signed)
Called daughter to confirm that she has moved her care to [facility]. She will get treatment today at [facility] and from now on. Appts canceled.

## 2021-01-18 ENCOUNTER — Other Ambulatory Visit: Payer: Self-pay | Admitting: Hematology and Oncology

## 2021-01-28 ENCOUNTER — Other Ambulatory Visit: Payer: Self-pay | Admitting: Hematology and Oncology

## 2021-02-01 DIAGNOSIS — R1909 Other intra-abdominal and pelvic swelling, mass and lump: Secondary | ICD-10-CM | POA: Diagnosis not present

## 2021-02-01 DIAGNOSIS — N6489 Other specified disorders of breast: Secondary | ICD-10-CM | POA: Diagnosis not present

## 2021-02-01 DIAGNOSIS — R918 Other nonspecific abnormal finding of lung field: Secondary | ICD-10-CM | POA: Diagnosis not present

## 2021-02-01 DIAGNOSIS — N131 Hydronephrosis with ureteral stricture, not elsewhere classified: Secondary | ICD-10-CM | POA: Diagnosis not present

## 2021-02-01 DIAGNOSIS — R911 Solitary pulmonary nodule: Secondary | ICD-10-CM | POA: Diagnosis not present

## 2021-02-08 DIAGNOSIS — G893 Neoplasm related pain (acute) (chronic): Secondary | ICD-10-CM | POA: Diagnosis not present

## 2021-02-08 DIAGNOSIS — L89151 Pressure ulcer of sacral region, stage 1: Secondary | ICD-10-CM | POA: Diagnosis not present

## 2021-02-08 DIAGNOSIS — D649 Anemia, unspecified: Secondary | ICD-10-CM | POA: Diagnosis not present

## 2021-02-08 DIAGNOSIS — R634 Abnormal weight loss: Secondary | ICD-10-CM | POA: Diagnosis not present

## 2021-02-08 DIAGNOSIS — I82562 Chronic embolism and thrombosis of left calf muscular vein: Secondary | ICD-10-CM | POA: Diagnosis not present

## 2021-02-23 ENCOUNTER — Other Ambulatory Visit: Payer: Self-pay | Admitting: Hematology and Oncology

## 2021-04-21 DEATH — deceased

## 2021-06-03 ENCOUNTER — Encounter: Payer: Self-pay | Admitting: Hematology and Oncology

## 2021-06-10 ENCOUNTER — Other Ambulatory Visit: Payer: Medicare Other

## 2022-11-22 IMAGING — CT CT CHEST W/O CM
2 of 4 series · 13 of 36 positions shown, 16 images · non-contrast
Comparison: Multiple exams, including 08/05/2020 and 12/24/2019

CLINICAL DATA: Endometrial cancer in 1515, completed radiation
therapy. Prior hysterectomy. Remote history of breast cancer. Left
leg pain over the last 3 weeks.

EXAM:
CT CHEST, ABDOMEN AND PELVIS WITHOUT CONTRAST
TECHNIQUE: Multidetector CT imaging of the chest, abdomen and pelvis was
performed following the standard protocol without IV contrast.

[Series 2: cap w/o · axial · non-contrast · 0.71mm/px · z∈[-579,-94]mm · 10 of 117 slices shown, 13 images]
[im 10/117  mediastinal]
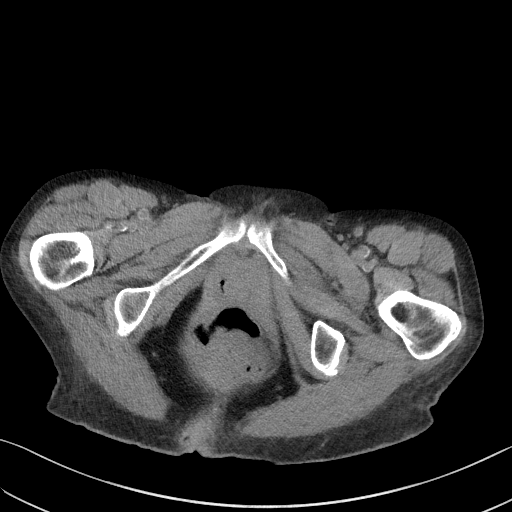
[im 10/117  lung]
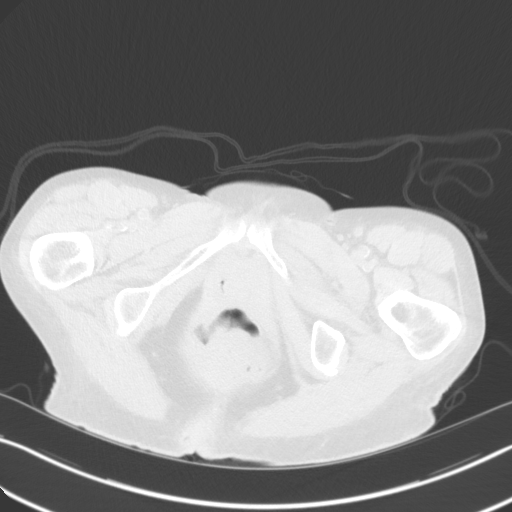
[im 20/117  lung]
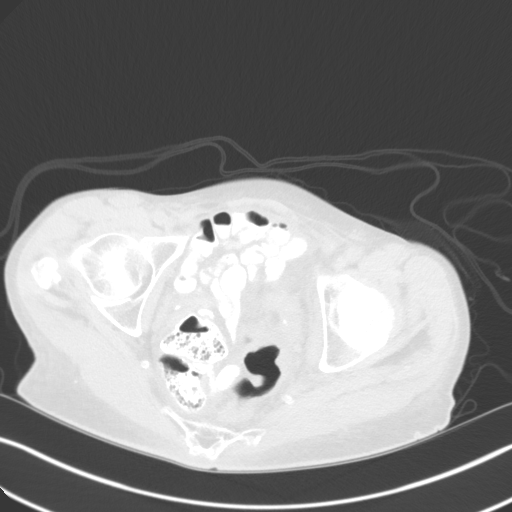
[im 30/117  lung]
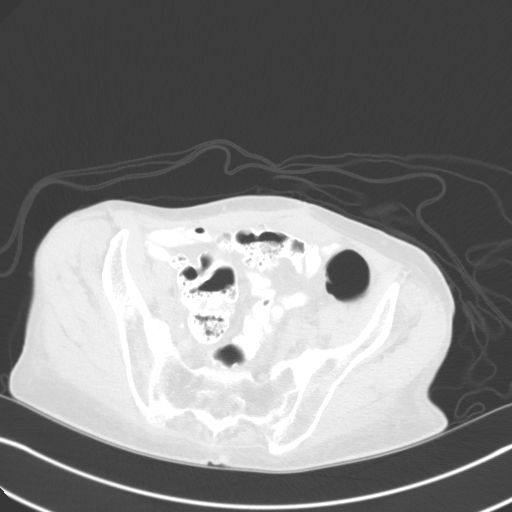
[im 39/117  lung]
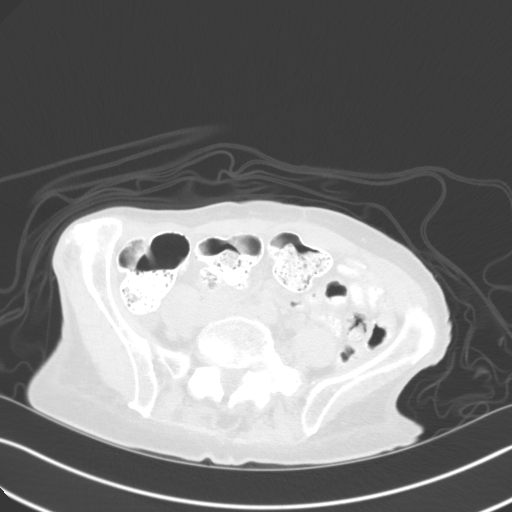
[im 49/117  mediastinal]
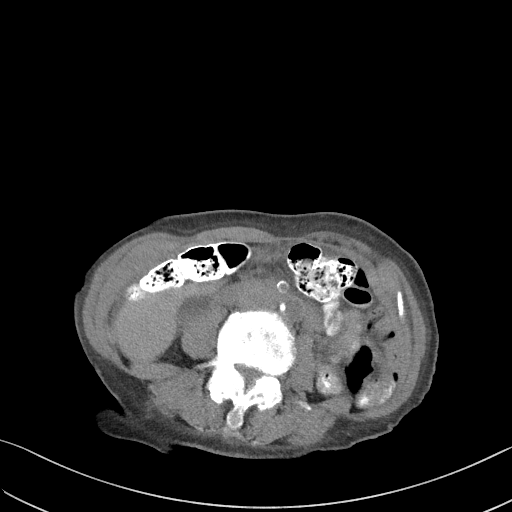
[im 49/117  lung]
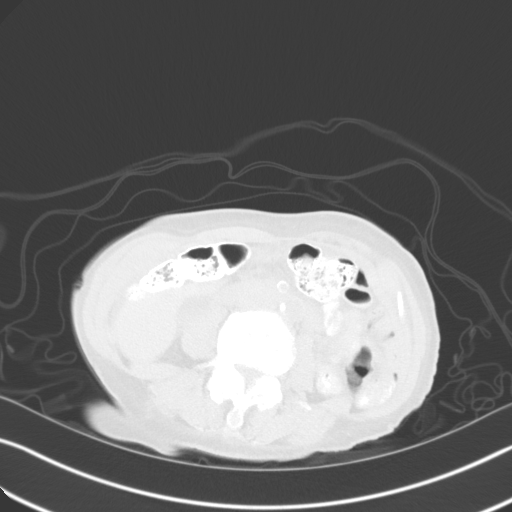
[im 68/117  lung]
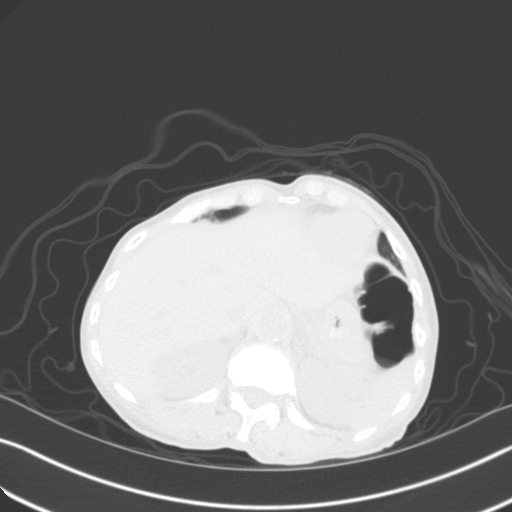
[im 78/117  lung]
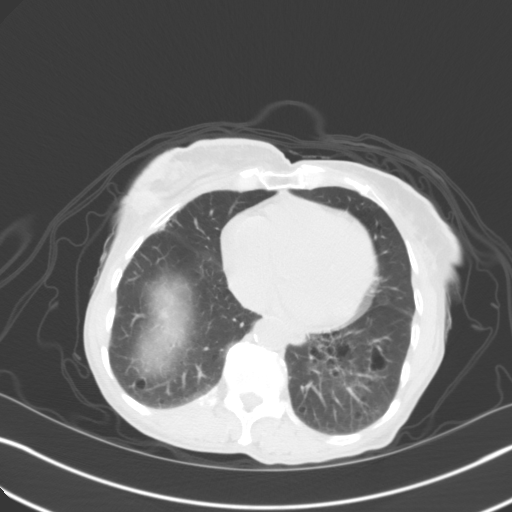
[im 88/117  lung]
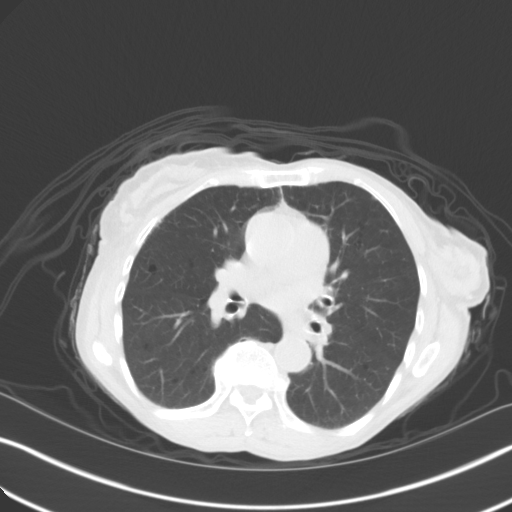
[im 97/117  mediastinal]
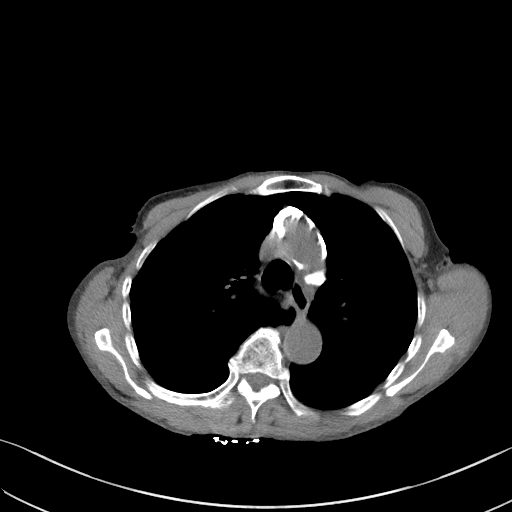
[im 97/117  lung]
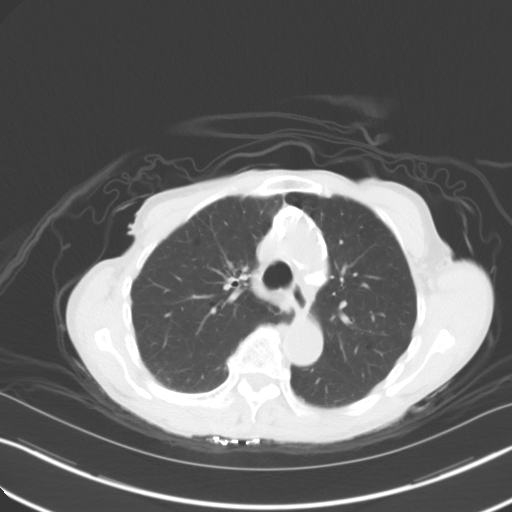
[im 107/117  lung]
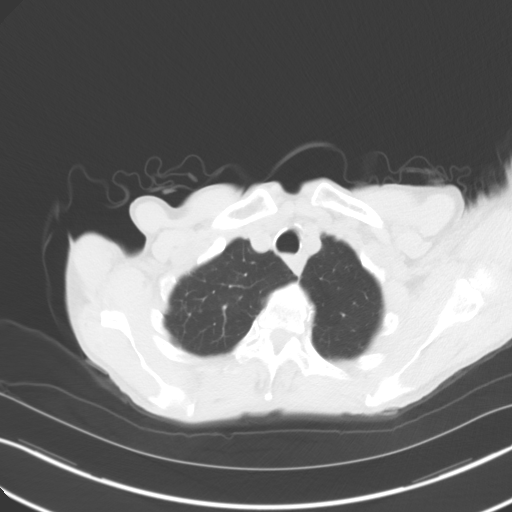

[Series 4: coronals · coronal · 0.72mm/px · 3 of 128 slices shown]
[im 26/128  lung]
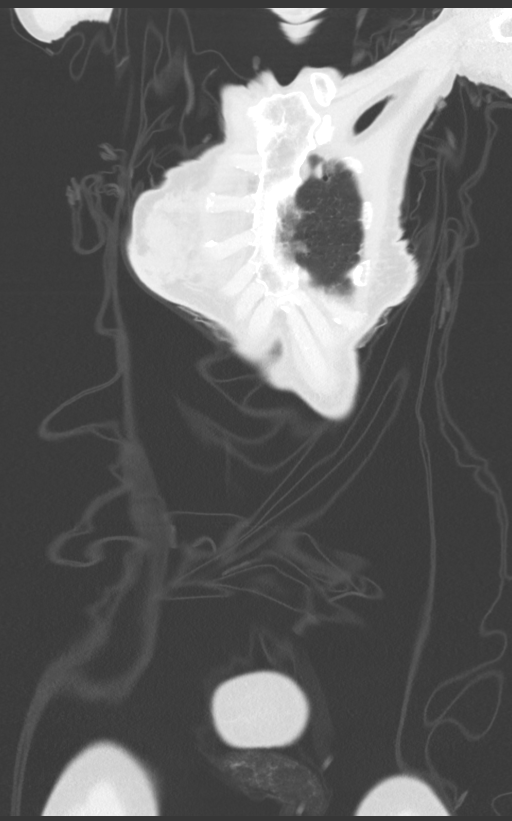
[im 51/128  lung]
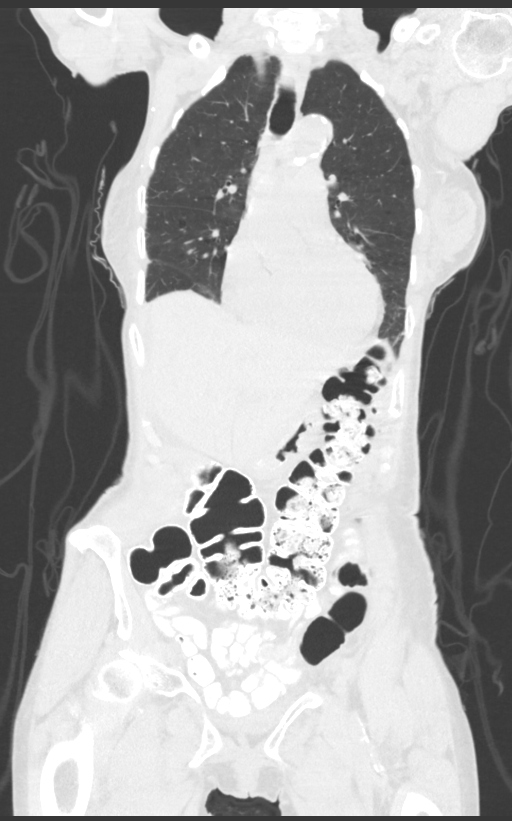
[im 77/128  lung]
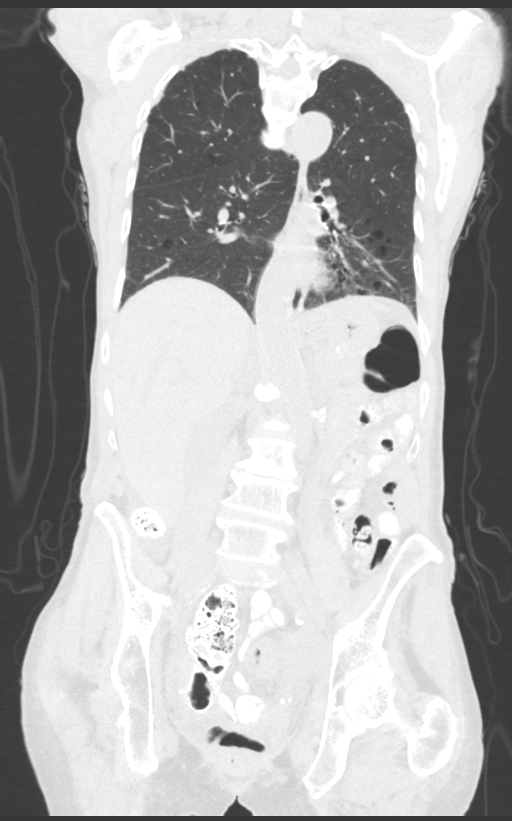

[13 of 36 positions shown; findings below may reference images not displayed]

FINDINGS: CT CHEST FINDINGS

Cardiovascular: Atherosclerotic calcification of the aortic arch and
branch vessels.

Mediastinum/Nodes: Unremarkable

Lungs/Pleura: Stable biapical scarring right greater than left. Mild
findings of centrilobular emphysema. Scattered peripheral
reticulonodular interstitial accentuation in the lungs similar to
prior. 3 mm right lower lobe nodule on image 82 series 6, no change
from earliest available comparison of 01/08/2019, considered benign.

Musculoskeletal: Mild degenerative glenohumeral arthropathy
bilaterally. Chronic postoperative findings in the right axilla.
Thoracic spondylosis and mild dextroconvex thoracic scoliosis.

CT ABDOMEN PELVIS FINDINGS

Hepatobiliary: Unremarkable

Pancreas: Unremarkable

Spleen: Unremarkable

Adrenals/Urinary Tract: Unremarkable

Stomach/Bowel: Unremarkable

Vascular/Lymphatic: Aortoiliac atherosclerotic vascular disease.

Reproductive: Substantial increase prominence of the left pelvic
sidewall nodule along the left adnexa measuring 4.5 by 3.5 cm,
previously about 1.7 by 1.3 cm.

Other: Small amount of nonspecific ascites.

Musculoskeletal: Grade 1 degenerative anterolisthesis at L4-5. Mild
levoconvex lumbar scoliosis with spondylosis and degenerative disc
disease contributing to impingement at the L3-4, L4-5, and L5-S1
levels. Possible mild pelvic floor laxity.
IMPRESSION: 1. Prominence of the left ovary/adnexa versus adjacent adenopathy
along the left pelvic sidewall, substantially increased from prior
exam with soft tissue density in this region measuring 4.5 by 3.5 cm
(formerly 1.7 by 1.3 cm on 08/05/2020). Pelvic sonography
recommended for further characterization.
2. New mild nonspecific ascites.
3. Other imaging findings of potential clinical significance: Aortic
Atherosclerosis (YWHV9-APK.K) and Emphysema (YWHV9-FSX.5). Scattered
stable reticulonodular peripheral interstitial accentuation in the
lungs. Lower lumbar impingement.

## 2022-11-22 IMAGING — CT CT ABD-PELV W/O CM
2 of 4 series · 13 of 36 positions shown, 16 images · non-contrast
Comparison: Multiple exams, including 08/05/2020 and 12/24/2019

CLINICAL DATA: Endometrial cancer in 1515, completed radiation
therapy. Prior hysterectomy. Remote history of breast cancer. Left
leg pain over the last 3 weeks.

EXAM:
CT CHEST, ABDOMEN AND PELVIS WITHOUT CONTRAST
TECHNIQUE: Multidetector CT imaging of the chest, abdomen and pelvis was
performed following the standard protocol without IV contrast.

[Series 2: cap w/o · axial · non-contrast · 0.71mm/px · z∈[-579,-94]mm · 10 of 117 slices shown, 13 images]
[im 10/117  mediastinal]
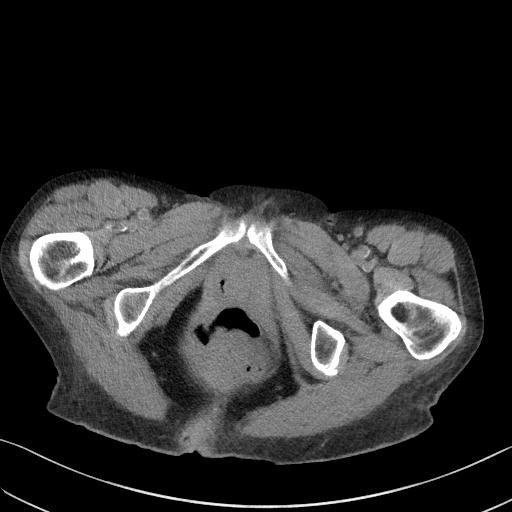
[im 10/117  lung]
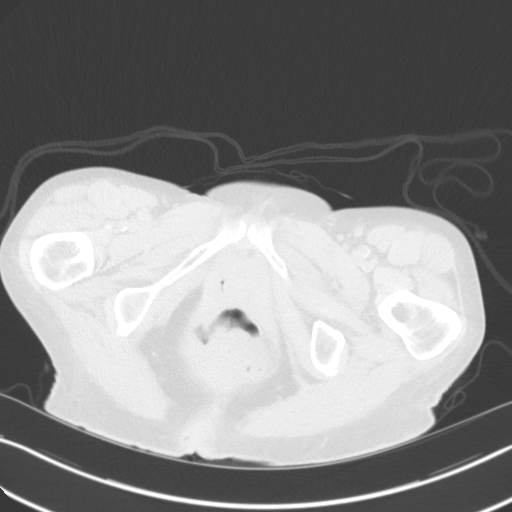
[im 20/117  lung]
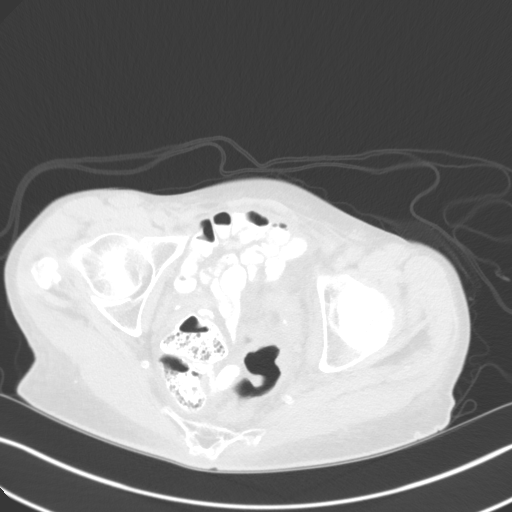
[im 30/117  lung]
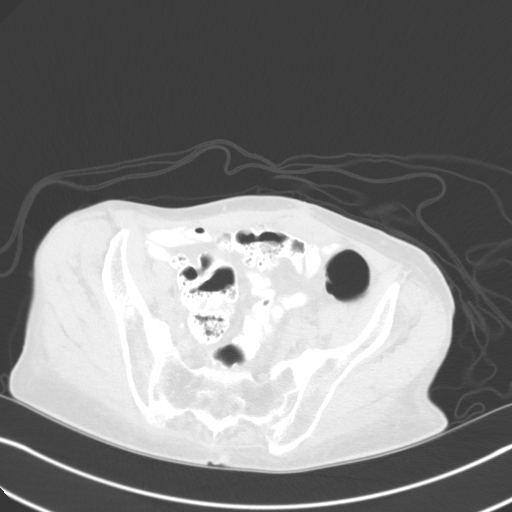
[im 39/117  lung]
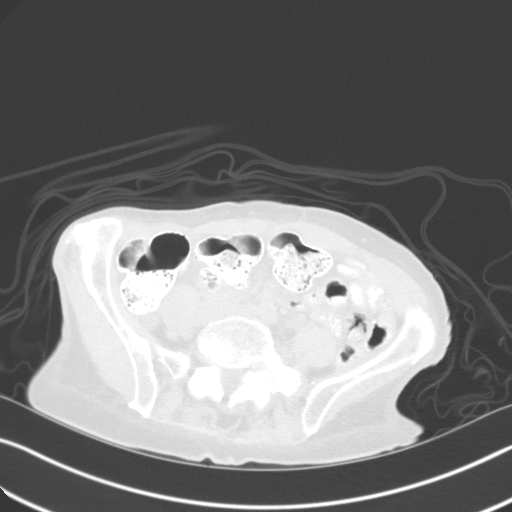
[im 49/117  mediastinal]
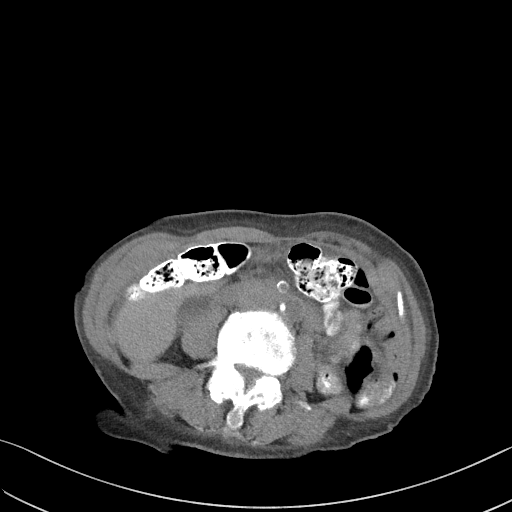
[im 49/117  lung]
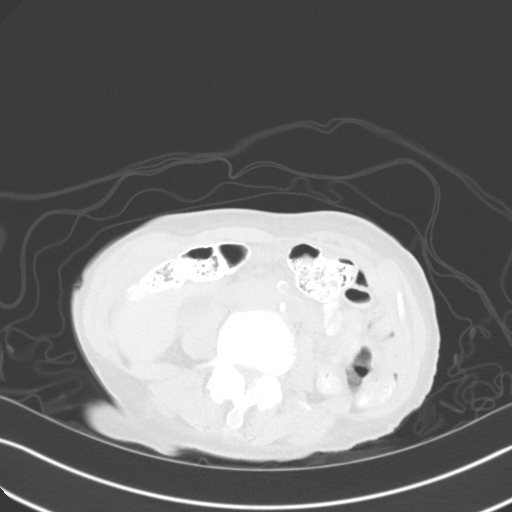
[im 68/117  lung]
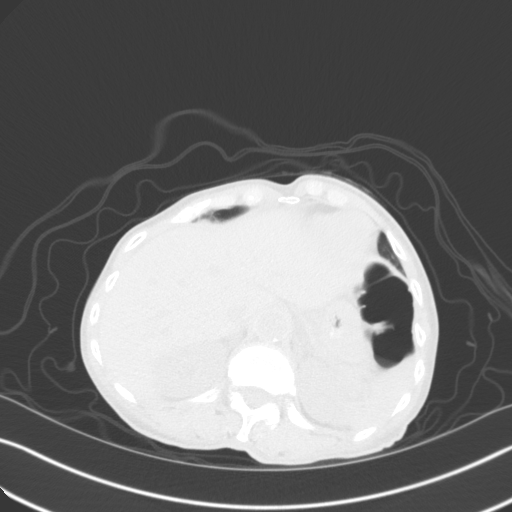
[im 78/117  lung]
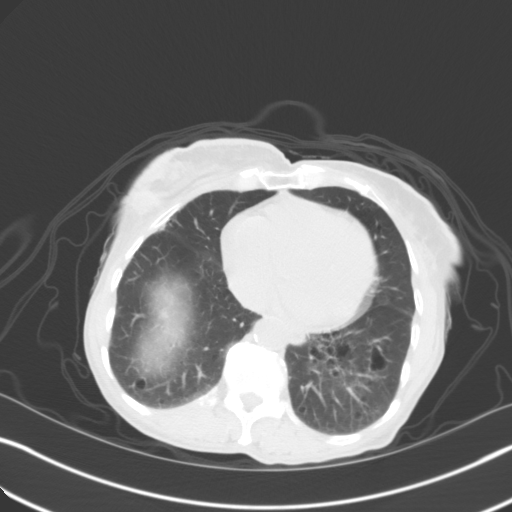
[im 88/117  lung]
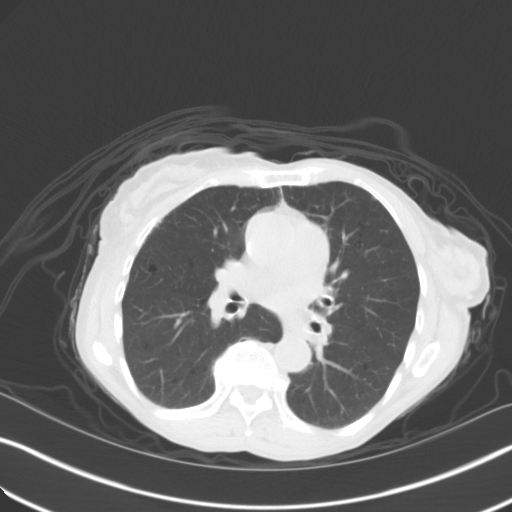
[im 97/117  mediastinal]
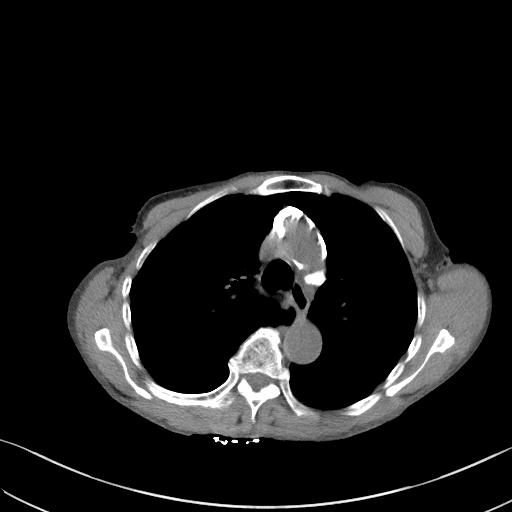
[im 97/117  lung]
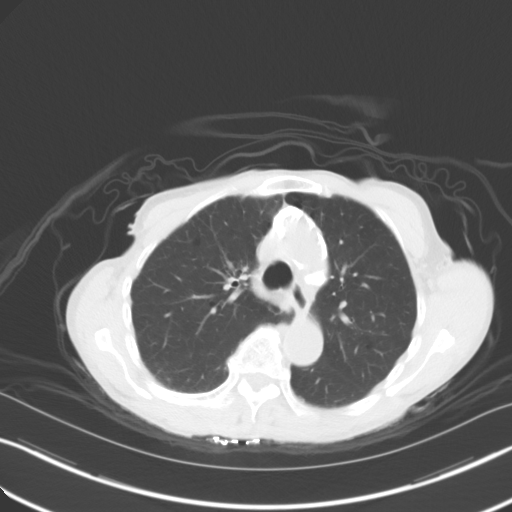
[im 107/117  lung]
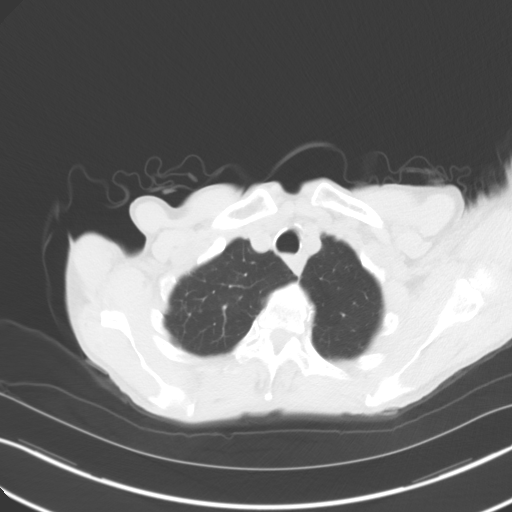

[Series 4: coronals · coronal · 0.72mm/px · 3 of 128 slices shown]
[im 26/128  lung]
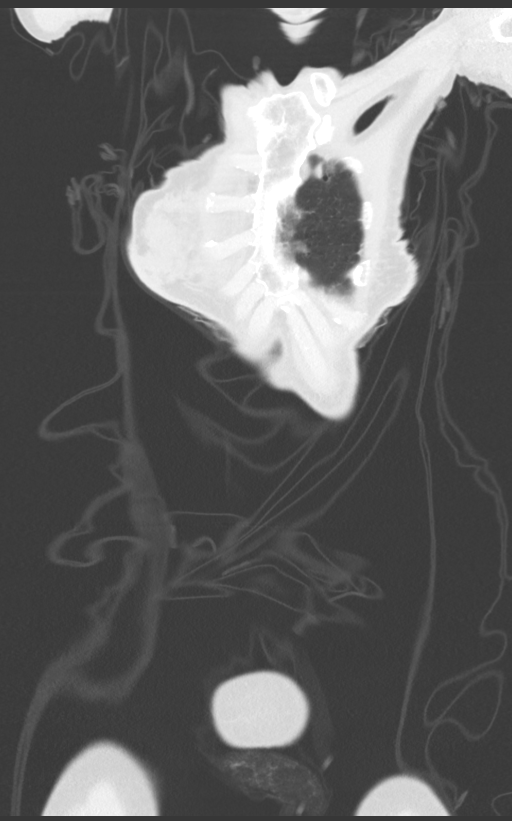
[im 51/128  lung]
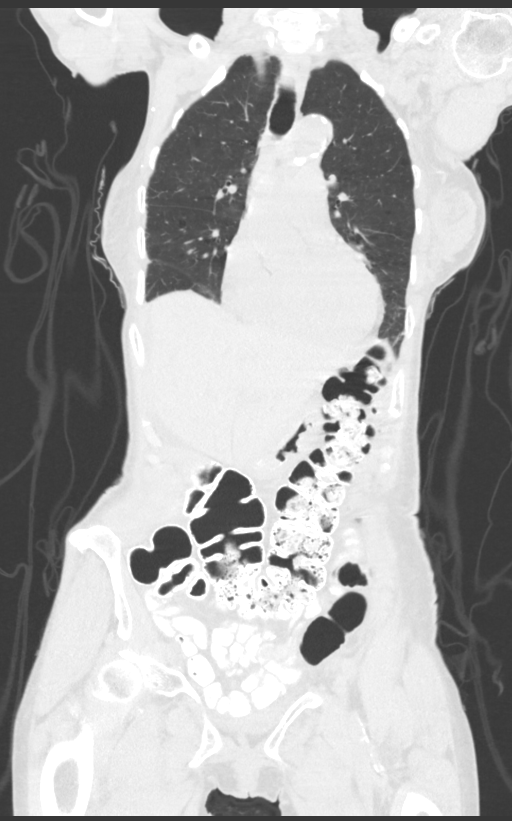
[im 77/128  lung]
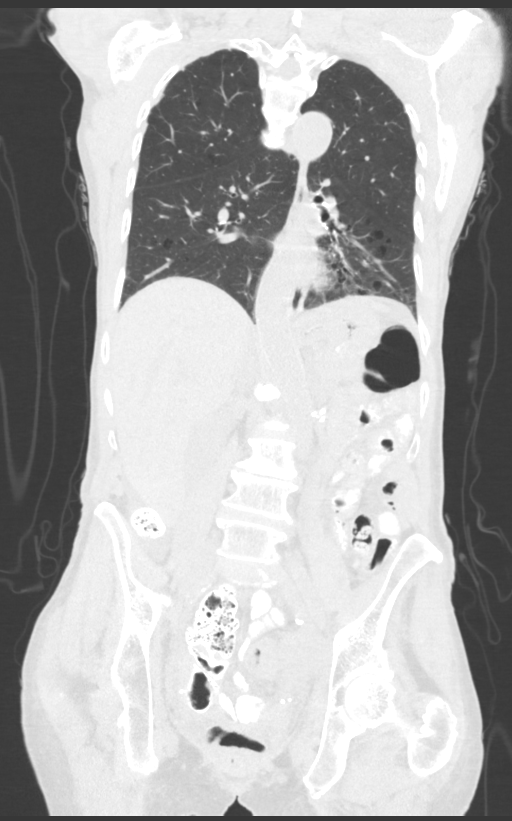

[13 of 36 positions shown; findings below may reference images not displayed]

FINDINGS: CT CHEST FINDINGS

Cardiovascular: Atherosclerotic calcification of the aortic arch and
branch vessels.

Mediastinum/Nodes: Unremarkable

Lungs/Pleura: Stable biapical scarring right greater than left. Mild
findings of centrilobular emphysema. Scattered peripheral
reticulonodular interstitial accentuation in the lungs similar to
prior. 3 mm right lower lobe nodule on image 82 series 6, no change
from earliest available comparison of 01/08/2019, considered benign.

Musculoskeletal: Mild degenerative glenohumeral arthropathy
bilaterally. Chronic postoperative findings in the right axilla.
Thoracic spondylosis and mild dextroconvex thoracic scoliosis.

CT ABDOMEN PELVIS FINDINGS

Hepatobiliary: Unremarkable

Pancreas: Unremarkable

Spleen: Unremarkable

Adrenals/Urinary Tract: Unremarkable

Stomach/Bowel: Unremarkable

Vascular/Lymphatic: Aortoiliac atherosclerotic vascular disease.

Reproductive: Substantial increase prominence of the left pelvic
sidewall nodule along the left adnexa measuring 4.5 by 3.5 cm,
previously about 1.7 by 1.3 cm.

Other: Small amount of nonspecific ascites.

Musculoskeletal: Grade 1 degenerative anterolisthesis at L4-5. Mild
levoconvex lumbar scoliosis with spondylosis and degenerative disc
disease contributing to impingement at the L3-4, L4-5, and L5-S1
levels. Possible mild pelvic floor laxity.
IMPRESSION: 1. Prominence of the left ovary/adnexa versus adjacent adenopathy
along the left pelvic sidewall, substantially increased from prior
exam with soft tissue density in this region measuring 4.5 by 3.5 cm
(formerly 1.7 by 1.3 cm on 08/05/2020). Pelvic sonography
recommended for further characterization.
2. New mild nonspecific ascites.
3. Other imaging findings of potential clinical significance: Aortic
Atherosclerosis ([id number].K) and Emphysema (YWHV9-FSX.5). Scattered
stable reticulonodular peripheral interstitial accentuation in the
lungs. Lower lumbar impingement.

## 2023-08-03 ENCOUNTER — Encounter: Payer: Self-pay | Admitting: Obstetrics and Gynecology
